# Patient Record
Sex: Female | Born: 1990 | Race: Black or African American | Hispanic: No | Marital: Single
Health system: Midwestern US, Community
[De-identification: ages and names within clinical notes are randomized; demographics above are authoritative.]

## PROBLEM LIST (undated history)

## (undated) DIAGNOSIS — F329 Major depressive disorder, single episode, unspecified: Secondary | ICD-10-CM

## (undated) DIAGNOSIS — M3212 Pericarditis in systemic lupus erythematosus: Secondary | ICD-10-CM

## (undated) DIAGNOSIS — K219 Gastro-esophageal reflux disease without esophagitis: Secondary | ICD-10-CM

## (undated) DIAGNOSIS — F32A Depression, unspecified: Secondary | ICD-10-CM

## (undated) DIAGNOSIS — Z8719 Personal history of other diseases of the digestive system: Secondary | ICD-10-CM

## (undated) DIAGNOSIS — M1990 Unspecified osteoarthritis, unspecified site: Secondary | ICD-10-CM

## (undated) DIAGNOSIS — M329 Systemic lupus erythematosus, unspecified: Secondary | ICD-10-CM

## (undated) DIAGNOSIS — M797 Fibromyalgia: Secondary | ICD-10-CM

## (undated) DIAGNOSIS — Z8711 Personal history of peptic ulcer disease: Secondary | ICD-10-CM

## (undated) DIAGNOSIS — F4310 Post-traumatic stress disorder, unspecified: Secondary | ICD-10-CM

## (undated) DIAGNOSIS — IMO0002 Reserved for concepts with insufficient information to code with codable children: Secondary | ICD-10-CM

## (undated) DIAGNOSIS — F1110 Opioid abuse, uncomplicated: Secondary | ICD-10-CM

## (undated) HISTORY — PX: UPPER GI ENDOSCOPY: SHX6162

## (undated) HISTORY — PX: NO PAST SURGERIES: SHX2092

---
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
## 2012-02-22 ENCOUNTER — Emergency Department (HOSPITAL_BASED_OUTPATIENT_CLINIC_OR_DEPARTMENT_OTHER)
Admission: EM | Admit: 2012-02-22 | Discharge: 2012-02-22 | Disposition: A | Discharge status: Home / Self Care | Payer: Medicaid Other | Attending: Emergency Medicine | Admitting: Emergency Medicine

## 2012-02-22 ENCOUNTER — Encounter (HOSPITAL_BASED_OUTPATIENT_CLINIC_OR_DEPARTMENT_OTHER): Payer: Self-pay | Admitting: *Deleted

## 2012-02-22 DIAGNOSIS — IMO0001 Reserved for inherently not codable concepts without codable children: Secondary | ICD-10-CM | POA: Insufficient documentation

## 2012-02-22 DIAGNOSIS — J45909 Unspecified asthma, uncomplicated: Secondary | ICD-10-CM | POA: Insufficient documentation

## 2012-02-22 DIAGNOSIS — Z87891 Personal history of nicotine dependence: Secondary | ICD-10-CM | POA: Insufficient documentation

## 2012-02-22 DIAGNOSIS — F329 Major depressive disorder, single episode, unspecified: Secondary | ICD-10-CM | POA: Insufficient documentation

## 2012-02-22 DIAGNOSIS — R21 Rash and other nonspecific skin eruption: Secondary | ICD-10-CM | POA: Insufficient documentation

## 2012-02-22 DIAGNOSIS — F3289 Other specified depressive episodes: Secondary | ICD-10-CM | POA: Insufficient documentation

## 2012-02-22 MED ORDER — PERMETHRIN 5 % EX CREA: TOPICAL_CREAM | CUTANEOUS | Status: AC

## 2012-02-22 NOTE — ED Notes (Signed)
Pt c/o rash to entire body , mother dx scabies x 1 week ago

## 2012-02-22 NOTE — ED Provider Notes (Signed)
History     CSN: [id number]  Arrival date & time 02/22/12  1547   First MD Initiated Contact with Patient 02/22/12 1648      Chief Complaint  Patient presents with  . Rash    (Consider location/radiation/quality/duration/timing/severity/associated sxs/prior treatment) Patient is a 21 y.o. female presenting with rash. The history is provided by the patient.  Rash  This is a new problem. The current episode started more than 2 days ago. There has been no fever. Associated symptoms comments: Mom was recently diagnosed with scabies and the patient developed a rash soon after her diagnosis. Rash is on upper extremities bilaterally on posterior shoulders and forearms. No pain, blistering or drainage. Patient complains of itching..    Past Medical History  Diagnosis Date  . Asthma   . Depressed   . Fibromyalgia     History reviewed. No pertinent past surgical history.  History reviewed. No pertinent family history.  History  Substance Use Topics  . Smoking status: Former Games developer  . Smokeless tobacco: Not on file  . Alcohol Use: Yes     occa liquor and beer    OB History    Grav Para Term Preterm Abortions TAB SAB Ect Mult Living                  Review of Systems  Constitutional: Negative for fever.  Musculoskeletal: Negative for myalgias.  Skin: Positive for rash.    Allergies  Review of patient's allergies indicates no known allergies.  Home Medications   Current Outpatient Rx  Name Route Sig Dispense Refill  . ALBUTEROL SULFATE HFA 108 (90 BASE) MCG/ACT IN AERS Inhalation Inhale 2 puffs into the lungs every 6 (six) hours as needed. For wheezing and shortness of breath.    . FLUOXETINE HCL 10 MG PO TABS Oral Take 20 mg by mouth daily. Pt takes 2 tabs of 10 mg for 20 mg dose    . LORATADINE 10 MG PO TABS Oral Take 10 mg by mouth daily.    . MULTIVITAMIN PO Oral Take 1 tablet by mouth daily.    Marland Kitchen PREGABALIN 25 MG PO CAPS Oral Take 25 mg by mouth 2 (two) times  daily.    . TRAZODONE HCL 50 MG PO TABS Oral Take 50 mg by mouth at bedtime.    . ALBUTEROL SULFATE (2.5 MG/3ML) 0.083% IN NEBU Nebulization Take 2.5 mg by nebulization every 6 (six) hours as needed. For wheezing and shortness of breath.      BP 133/55  Pulse 80  Temp 98.9 F (37.2 C) (Oral)  Resp 16  Ht 5\' 7"  (1.702 m)  Wt 140 lb (63.504 kg)  BMI 21.93 kg/m2  SpO2 100%  LMP 01/26/2012  Physical Exam  Constitutional: She is oriented to person, place, and time. She appears well-developed and well-nourished.  Neck: Normal range of motion.  Pulmonary/Chest: Effort normal.  Neurological: She is alert and oriented to person, place, and time.  Skin: Skin is warm and dry.       Minimally raised, normopigmented rash posterior shoulders that is diffuse, not confluent. No pustules. Singular raised bumps diffusely to forearms without pattern or confluence.     ED Course  Procedures (including critical care time)  Labs Reviewed - No data to display No results found.   No diagnosis found. 1. Rash    MDM  Will treat based on exposure but appearance of rash seems unlikely to be scabies. Discussed with mom. Derm referral.  Rodena Medin, PA-C 02/22/12 [phone]

## 2012-02-23 NOTE — ED Provider Notes (Signed)
Medical screening examination/treatment/procedure(s) were performed by non-physician practitioner and as supervising physician I was immediately available for consultation/collaboration.  Morene Cecilio, MD 02/23/12 1431 

## 2012-03-10 ENCOUNTER — Encounter (HOSPITAL_COMMUNITY): Payer: Self-pay | Admitting: Emergency Medicine

## 2012-03-10 ENCOUNTER — Emergency Department (HOSPITAL_COMMUNITY)
Admission: EM | Admit: 2012-03-10 | Discharge: 2012-03-10 | Disposition: A | Discharge status: Home / Self Care | Payer: Medicaid Other | Attending: Emergency Medicine | Admitting: Emergency Medicine

## 2012-03-10 DIAGNOSIS — Z760 Encounter for issue of repeat prescription: Secondary | ICD-10-CM

## 2012-03-10 NOTE — ED Notes (Signed)
Instructed patient per PA that we could not give her a new prescription.  Patient instructed to call pharmacy or PCP for new prescription.

## 2012-03-10 NOTE — ED Notes (Signed)
Pt states "I need another prescription for Prozac, I lost it, my psychiatrist wrote it, I think it's Geralynn Ochs, didn't call her, don't have the #"

## 2012-03-11 NOTE — ED Provider Notes (Signed)
Medical screening examination/treatment/procedure(s) were performed by non-physician practitioner and as supervising physician I was immediately available for consultation/collaboration.   Lyanne Co, MD 03/11/12 [phone]

## 2012-03-11 NOTE — ED Provider Notes (Signed)
I was informed of this patient by Richardean Sale RN. She was here for refill of her Prozac. I asked the nurse to inquire if the patient had any medical issues at this time. I expressed that we will be happy to see her,but that she could certainly call her pharmacy or her prescribing physician for refill on this medication.  Nurse spoke to the patient.Before I could get to the room for evaluation,the patient  had left.  Arthor Captain, PA-C 03/11/12 0155  Arthor Captain, PA-C 03/11/12 1048

## 2012-04-21 ENCOUNTER — Emergency Department (HOSPITAL_BASED_OUTPATIENT_CLINIC_OR_DEPARTMENT_OTHER)
Admission: EM | Admit: 2012-04-21 | Discharge: 2012-04-21 | Disposition: A | Discharge status: Home / Self Care | Payer: Medicaid Other | Attending: Emergency Medicine | Admitting: Emergency Medicine

## 2012-04-21 ENCOUNTER — Emergency Department (HOSPITAL_BASED_OUTPATIENT_CLINIC_OR_DEPARTMENT_OTHER): Payer: Medicaid Other

## 2012-04-21 ENCOUNTER — Encounter (HOSPITAL_BASED_OUTPATIENT_CLINIC_OR_DEPARTMENT_OTHER): Payer: Self-pay

## 2012-04-21 DIAGNOSIS — Y92410 Unspecified street and highway as the place of occurrence of the external cause: Secondary | ICD-10-CM | POA: Insufficient documentation

## 2012-04-21 DIAGNOSIS — S93409A Sprain of unspecified ligament of unspecified ankle, initial encounter: Secondary | ICD-10-CM

## 2012-04-21 HISTORY — DX: Post-traumatic stress disorder, unspecified: F43.10

## 2012-04-21 MED ORDER — HYDROCODONE-ACETAMINOPHEN 5-500 MG PO TABS: 1.0000 | ORAL_TABLET | Freq: Four times a day (QID) | ORAL | Status: DC | PRN

## 2012-04-21 NOTE — ED Notes (Signed)
Pt sustained left ankle injury yesterday.

## 2012-04-21 NOTE — ED Provider Notes (Addendum)
History     CSN: [id number]  Arrival date & time 04/21/12  0906   First MD Initiated Contact with Patient 04/21/12 [phone]      Chief Complaint  Patient presents with  . Ankle Pain    (Consider location/radiation/quality/duration/timing/severity/associated sxs/prior treatment) Patient is a 21 y.o. female presenting with ankle pain. The history is provided by the patient.  Ankle Pain  The incident occurred yesterday. The incident occurred in the street. The injury mechanism was a fall (riding on a skateboard and fell). The pain is present in the left ankle. The quality of the pain is described as sharp and throbbing. The pain is at a severity of 7/10. The pain is moderate. The pain has been constant since onset. Pertinent negatives include no loss of sensation and no tingling. Associated symptoms comments: limping. The symptoms are aggravated by activity, bearing weight and palpation. She has tried nothing for the symptoms. The treatment provided no relief.    Past Medical History  Diagnosis Date  . Asthma   . Depressed   . Fibromyalgia   . PTSD (post-traumatic stress disorder)     History reviewed. No pertinent past surgical history.  No family history on file.  History  Substance Use Topics  . Smoking status: Former Games developer  . Smokeless tobacco: Not on file  . Alcohol Use: Yes     occa liquor and beer    OB History    Grav Para Term Preterm Abortions TAB SAB Ect Mult Living                  Review of Systems  Neurological: Negative for tingling.  All other systems reviewed and are negative.    Allergies  Review of patient's allergies indicates no known allergies.  Home Medications   Current Outpatient Rx  Name Route Sig Dispense Refill  . FLUOXETINE HCL 10 MG PO TABS Oral Take 20 mg by mouth daily.     Marland Kitchen LORATADINE 10 MG PO TABS Oral Take 10 mg by mouth daily.    . MULTIVITAMIN PO Oral Take 1 tablet by mouth daily.    Marland Kitchen PREGABALIN 25 MG PO CAPS Oral Take 25  mg by mouth 2 (two) times daily.    . TRAZODONE HCL 50 MG PO TABS Oral Take 50 mg by mouth at bedtime.      BP 123/77  Pulse 72  Temp 98.1 F (36.7 C) (Oral)  Resp 16  Ht 5\' 4"  (1.626 m)  Wt 140 lb (63.504 kg)  BMI 24.03 kg/m2  SpO2 100%  LMP 04/14/2012  Physical Exam  Nursing note and vitals reviewed. Constitutional: She is oriented to person, place, and time. She appears well-developed and well-nourished. No distress.  HENT:  Head: Normocephalic and atraumatic.  Eyes: EOM are normal. Pupils are equal, round, and reactive to light.  Musculoskeletal:       Left ankle: She exhibits swelling. She exhibits normal range of motion and no deformity. tenderness. Lateral malleolus and posterior TFL tenderness found. No medial malleolus, no AITFL, no head of 5th metatarsal and no proximal fibula tenderness found.       Feet:  Neurological: She is alert and oriented to person, place, and time.  Skin: Skin is warm and dry. No rash noted. No erythema.  Psychiatric: She has a normal mood and affect. Her behavior is normal.    ED Course  Procedures (including critical care time)  Labs Reviewed - No data to display Dg Ankle Complete  Left  04/21/2012  *RADIOLOGY REPORT*  Clinical Data: Pain post trauma  LEFT ANKLE COMPLETE - 3+ VIEW  Comparison: None.  Findings: Frontal, oblique, lateral views were obtained.  No fracture or effusion.  Ankle mortise appears intact.  There is pes planus.  IMPRESSION:   No fracture.  Mortise intact.   Original Report Authenticated By: Arvin Collard. WOODRUFF III, M.D.      1. Ankle sprain       MDM   Patient with an injury to the ankle after a skateboarding accident. Pain with walking. No fibular head tenderness. Plain films wnl.  Will treat for sprain         Gwyneth Sprout, MD 04/21/12 1610  Gwyneth Sprout, MD 04/21/12 [phone]

## 2012-05-02 ENCOUNTER — Emergency Department (HOSPITAL_COMMUNITY)
Admission: EM | Admit: 2012-05-02 | Discharge: 2012-05-03 | Disposition: A | Discharge status: Other Acute Inpatient Hospital | Payer: Medicaid Other | Attending: Emergency Medicine | Admitting: Emergency Medicine

## 2012-05-02 ENCOUNTER — Encounter (HOSPITAL_COMMUNITY): Payer: Self-pay | Admitting: Emergency Medicine

## 2012-05-02 DIAGNOSIS — F1110 Opioid abuse, uncomplicated: Secondary | ICD-10-CM

## 2012-05-02 DIAGNOSIS — J45909 Unspecified asthma, uncomplicated: Secondary | ICD-10-CM | POA: Insufficient documentation

## 2012-05-02 DIAGNOSIS — Z79899 Other long term (current) drug therapy: Secondary | ICD-10-CM | POA: Insufficient documentation

## 2012-05-02 DIAGNOSIS — F329 Major depressive disorder, single episode, unspecified: Secondary | ICD-10-CM | POA: Insufficient documentation

## 2012-05-02 DIAGNOSIS — F3289 Other specified depressive episodes: Secondary | ICD-10-CM | POA: Insufficient documentation

## 2012-05-02 DIAGNOSIS — F1994 Other psychoactive substance use, unspecified with psychoactive substance-induced mood disorder: Secondary | ICD-10-CM

## 2012-05-02 DIAGNOSIS — IMO0001 Reserved for inherently not codable concepts without codable children: Secondary | ICD-10-CM | POA: Insufficient documentation

## 2012-05-02 DIAGNOSIS — R45851 Suicidal ideations: Secondary | ICD-10-CM | POA: Insufficient documentation

## 2012-05-02 DIAGNOSIS — F17200 Nicotine dependence, unspecified, uncomplicated: Secondary | ICD-10-CM | POA: Insufficient documentation

## 2012-05-02 DIAGNOSIS — F1310 Sedative, hypnotic or anxiolytic abuse, uncomplicated: Secondary | ICD-10-CM | POA: Insufficient documentation

## 2012-05-02 LAB — RAPID URINE DRUG SCREEN, HOSP PERFORMED
Barbiturates: NOT DETECTED
Cocaine: NOT DETECTED
Opiates: NOT DETECTED

## 2012-05-02 LAB — CBC
Hemoglobin: 11.7 g/dL — ABNORMAL LOW (ref 12.0–15.0)
MCH: 25.8 pg — ABNORMAL LOW (ref 26.0–34.0)
MCHC: 31.7 g/dL (ref 30.0–36.0)
MCV: 81.3 fL (ref 78.0–100.0)
RBC: 4.54 MIL/uL (ref 3.87–5.11)

## 2012-05-02 LAB — COMPREHENSIVE METABOLIC PANEL
BUN: 6 mg/dL (ref 6–23)
CO2: 26 mEq/L (ref 19–32)
Calcium: 9.3 mg/dL (ref 8.4–10.5)
Creatinine, Ser: 0.75 mg/dL (ref 0.50–1.10)
GFR calc Af Amer: >90 mL/min (ref >90)
GFR calc non Af Amer: >90 mL/min (ref >90)
Glucose, Bld: 80 mg/dL (ref 70–99)
Total Protein: 7.3 g/dL (ref 6.0–8.3)

## 2012-05-02 LAB — ETHANOL: Alcohol, Ethyl (B): <11 mg/dL (ref 0–11)

## 2012-05-02 MED ORDER — TRAZODONE HCL 50 MG PO TABS
50.0000 mg | ORAL_TABLET | Freq: Every day | ORAL | Status: DC
  Administered 2012-05-02: 50 mg via ORAL
  Filled 2012-05-02: qty 1

## 2012-05-02 MED ORDER — LORATADINE 10 MG PO TABS
10.0000 mg | ORAL_TABLET | Freq: Every day | ORAL | Status: DC
  Administered 2012-05-02 – 2012-05-03 (×2): 10 mg via ORAL
  Filled 2012-05-02 (×2): qty 1

## 2012-05-02 MED ORDER — FLUOXETINE HCL 20 MG PO TABS
20.0000 mg | ORAL_TABLET | Freq: Every day | ORAL | Status: DC
Start: 2012-05-02 — End: 2012-05-03
  Administered 2012-05-02 – 2012-05-03 (×2): 20 mg via ORAL
  Filled 2012-05-02 (×2): qty 1

## 2012-05-02 MED ORDER — ACETAMINOPHEN 325 MG PO TABS: 650.0000 mg | ORAL_TABLET | ORAL | Status: DC | PRN

## 2012-05-02 MED ORDER — CLONIDINE HCL 0.1 MG PO TABS: 0.1000 mg | ORAL_TABLET | Freq: Two times a day (BID) | ORAL | Status: DC | PRN

## 2012-05-02 MED ORDER — ALUM & MAG HYDROXIDE-SIMETH 200-200-20 MG/5ML PO SUSP: 30.0000 mL | ORAL | Status: DC | PRN

## 2012-05-02 MED ORDER — LORAZEPAM 1 MG PO TABS: 1.0000 mg | ORAL_TABLET | Freq: Three times a day (TID) | ORAL | Status: DC | PRN

## 2012-05-02 MED ORDER — NICOTINE 21 MG/24HR TD PT24
21.0000 mg | MEDICATED_PATCH | Freq: Every day | TRANSDERMAL | Status: DC
  Administered 2012-05-02 – 2012-05-03 (×2): 21 mg via TRANSDERMAL
  Filled 2012-05-02 (×2): qty 1

## 2012-05-02 MED ORDER — ONDANSETRON HCL 4 MG PO TABS: 4.0000 mg | ORAL_TABLET | Freq: Three times a day (TID) | ORAL | Status: DC | PRN

## 2012-05-02 MED ORDER — IBUPROFEN 600 MG PO TABS: 600.0000 mg | ORAL_TABLET | Freq: Three times a day (TID) | ORAL | Status: DC | PRN

## 2012-05-02 MED ORDER — PREGABALIN 25 MG PO CAPS
25.0000 mg | ORAL_CAPSULE | Freq: Two times a day (BID) | ORAL | Status: DC
  Administered 2012-05-02 – 2012-05-03 (×3): 25 mg via ORAL
  Filled 2012-05-02 (×3): qty 1

## 2012-05-02 NOTE — Treatment Plan (Signed)
Pt claims to be "popping"  4-5 percocet a day and more when she can get it, but interestingly is negative on her urine drug screen for opioids.  She is only positive for [facility] which does require a detox.  Additionally, pt denies SI, HI, and psychosis.  This pt does meet criteria for an opioid detox.  Perhaps, you can repeat the UDS or a specific percocet test if pt continues to report heavy daily use.  Otherwise there is no justification for an inpatient crisis or detox bed.

## 2012-05-02 NOTE — ED Notes (Signed)
Pt speaking on phone to mother.
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
## 2012-05-02 NOTE — ED Notes (Signed)
Pt states she is feeling depressed and feels like hurting herself  Pt states she started feeling this way a week ago  Denies plan  Has hx of same

## 2012-05-02 NOTE — Progress Notes (Signed)
Per discussion with [facility], pt does not meet inpatient criteria. Pt does not require inpatient detox.   Molly Perkins, LCSWA  [phone] .05/02/2012 10:37pm
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
## 2012-05-02 NOTE — ED Provider Notes (Signed)
Medical screening examination/treatment/procedure(s) were performed by non-physician practitioner and as supervising physician I was immediately available for consultation/collaboration.  Olivia Mackie, MD 05/02/12 1640

## 2012-05-02 NOTE — Progress Notes (Signed)
CSW requested tele psych for patient as pt continues to deny SI, HI, and psychosis at this time. Pt does not meet inpatient criteria for detox as patient is only positive for thc. ACT assessment counselor to follow up with patient pending tele psych.   If patient remains in the ED, day ED CSW will assist with pt csw needs at that time.   .Clinical social worker continuing to follow pt to assist with pt dc plans and further csw needs.   Catha Gosselin, LCSWA  [phone] .05/02/2012 1105pm

## 2012-05-02 NOTE — ED Provider Notes (Signed)
History     CSN: [id number]  Arrival date & time 05/02/12  0459   First MD Initiated Contact with Patient 05/02/12 [phone]      Chief Complaint  Patient presents with  . Medical Clearance    (Consider location/radiation/quality/duration/timing/severity/associated sxs/prior treatment) HPI Comments: Patient with c/o worsening depression, thoughts of hurting herself, rx pain medication abuse. States suicidal thoughts have been worse over past week. States she has been getting rx medications from other ppl for past year and desires to quit. Admits marijuana, tobacco, occasional ETOH use. Otherwise denies medical complaint. Has seen psychiatrist in past -- but not for some time. Onset gradual. Nothing makes symptoms better or worse.   The history is provided by the patient.    Past Medical History  Diagnosis Date  . Asthma   . Depressed   . Fibromyalgia   . PTSD (post-traumatic stress disorder)     History reviewed. No pertinent past surgical history.  Family History  Problem Relation Age of Onset  . Diabetes Other     History  Substance Use Topics  . Smoking status: Current Every Day Smoker    Types: Cigarettes  . Smokeless tobacco: Not on file  . Alcohol Use: Yes     occa liquor and beer    OB History    Grav Para Term Preterm Abortions TAB SAB Ect Mult Living                  Review of Systems  Constitutional: Negative for fever.  HENT: Negative for sore throat and rhinorrhea.   Eyes: Negative for redness.  Respiratory: Negative for cough.   Cardiovascular: Negative for chest pain.  Gastrointestinal: Negative for nausea, vomiting, abdominal pain and diarrhea.  Genitourinary: Negative for dysuria.  Musculoskeletal: Negative for myalgias.  Skin: Negative for rash.  Neurological: Negative for headaches.  Psychiatric/Behavioral: Positive for suicidal ideas.    Allergies  Review of patient's allergies indicates no known allergies.  Home Medications    Current Outpatient Rx  Name Route Sig Dispense Refill  . FLUOXETINE HCL 10 MG PO TABS Oral Take 20 mg by mouth daily.     Marland Kitchen HYDROCODONE-ACETAMINOPHEN 5-500 MG PO TABS Oral Take 1-2 tablets by mouth every 6 (six) hours as needed for pain. 15 tablet 0  . LORATADINE 10 MG PO TABS Oral Take 10 mg by mouth daily.    . MULTIVITAMIN PO Oral Take 1 tablet by mouth daily.    Marland Kitchen PREGABALIN 25 MG PO CAPS Oral Take 25 mg by mouth 2 (two) times daily.    . TRAZODONE HCL 50 MG PO TABS Oral Take 50 mg by mouth at bedtime.      BP 131/80  Pulse 70  Temp 98.5 F (36.9 C) (Oral)  Resp 16  SpO2 100%  LMP 04/16/2012  Physical Exam  Nursing note and vitals reviewed. Constitutional: She appears well-developed and well-nourished.  HENT:  Head: Normocephalic and atraumatic.  Eyes: Conjunctivae normal are normal. Right eye exhibits no discharge. Left eye exhibits no discharge.  Neck: Normal range of motion. Neck supple.  Cardiovascular: Normal rate, regular rhythm and normal heart sounds.   Pulmonary/Chest: Effort normal and breath sounds normal.  Abdominal: Soft. There is no tenderness.  Neurological: She is alert.  Skin: Skin is warm and dry.  Psychiatric: She exhibits a depressed mood. She expresses suicidal ideation. She expresses no homicidal ideation.    ED Course  Procedures (including critical care time)  Labs Reviewed  CBC -  Abnormal; Notable for the following:    Hemoglobin 11.7 (*)     MCH 25.8 (*)     All other components within normal limits  COMPREHENSIVE METABOLIC PANEL - Abnormal; Notable for the following:    Total Bilirubin 0.2 (*)     All other components within normal limits  URINE RAPID DRUG SCREEN (HOSP PERFORMED) - Abnormal; Notable for the following:    Tetrahydrocannabinol POSITIVE (*)     All other components within normal limits  ETHANOL  PREGNANCY, URINE   No results found.   1. Suicidal ideation   2. Narcotic abuse     6:13 AM Patient seen and examined.  Work-up initiated. Holding orders completed.    Vital signs reviewed and are as follows: Filed Vitals:   05/02/12 0507  BP: 131/80  Pulse: 70  Temp: 98.5 F (36.9 C)  Resp: 16   8:20 AM ACT aware and will see. Patient medically cleared.   MDM  Patient pending ACT eval.         Renne Crigler, PA 05/02/12 [phone]

## 2012-05-03 ENCOUNTER — Inpatient Hospital Stay (HOSPITAL_COMMUNITY)
Admission: EM | Admit: 2012-05-03 | Discharge: 2012-05-08 | DRG: 897 | Disposition: A | Discharge status: Home / Self Care | Payer: Medicaid Other | Source: Ambulatory Visit | Attending: Psychiatry | Admitting: Psychiatry

## 2012-05-03 ENCOUNTER — Encounter (HOSPITAL_COMMUNITY): Payer: Self-pay

## 2012-05-03 DIAGNOSIS — F329 Major depressive disorder, single episode, unspecified: Secondary | ICD-10-CM

## 2012-05-03 DIAGNOSIS — F3289 Other specified depressive episodes: Secondary | ICD-10-CM | POA: Diagnosis present

## 2012-05-03 DIAGNOSIS — J45909 Unspecified asthma, uncomplicated: Secondary | ICD-10-CM | POA: Diagnosis present

## 2012-05-03 DIAGNOSIS — F1220 Cannabis dependence, uncomplicated: Secondary | ICD-10-CM | POA: Diagnosis present

## 2012-05-03 DIAGNOSIS — Z23 Encounter for immunization: Secondary | ICD-10-CM

## 2012-05-03 DIAGNOSIS — F1120 Opioid dependence, uncomplicated: Principal | ICD-10-CM | POA: Diagnosis present

## 2012-05-03 DIAGNOSIS — Z79899 Other long term (current) drug therapy: Secondary | ICD-10-CM

## 2012-05-03 DIAGNOSIS — IMO0001 Reserved for inherently not codable concepts without codable children: Secondary | ICD-10-CM | POA: Diagnosis present

## 2012-05-03 MED ORDER — METHOCARBAMOL 500 MG PO TABS
500.0000 mg | ORAL_TABLET | Freq: Three times a day (TID) | ORAL | Status: AC | PRN
  Administered 2012-05-07: 500 mg via ORAL
  Filled 2012-05-03: qty 1

## 2012-05-03 MED ORDER — ONDANSETRON 4 MG PO TBDP: 4.0000 mg | ORAL_TABLET | Freq: Four times a day (QID) | ORAL | Status: AC | PRN

## 2012-05-03 MED ORDER — HYDROXYZINE HCL 25 MG PO TABS: 25.0000 mg | ORAL_TABLET | Freq: Four times a day (QID) | ORAL | Status: AC | PRN

## 2012-05-03 MED ORDER — TRAZODONE HCL 50 MG PO TABS
50.0000 mg | ORAL_TABLET | Freq: Every day | ORAL | Status: DC
  Administered 2012-05-03: 50 mg via ORAL
  Filled 2012-05-03 (×4): qty 1

## 2012-05-03 MED ORDER — FLUOXETINE HCL 20 MG PO CAPS
20.0000 mg | ORAL_CAPSULE | Freq: Every day | ORAL | Status: DC
  Administered 2012-05-04: 20 mg via ORAL
  Filled 2012-05-03 (×5): qty 1

## 2012-05-03 MED ORDER — CLONIDINE HCL 0.1 MG PO TABS
0.1000 mg | ORAL_TABLET | ORAL | Status: AC
  Administered 2012-05-05 – 2012-05-06 (×2): 0.1 mg via ORAL
  Filled 2012-05-03 (×4): qty 1

## 2012-05-03 MED ORDER — ALBUTEROL SULFATE HFA 108 (90 BASE) MCG/ACT IN AERS: 2.0000 | INHALATION_SPRAY | Freq: Four times a day (QID) | RESPIRATORY_TRACT | Status: DC | PRN

## 2012-05-03 MED ORDER — NICOTINE 14 MG/24HR TD PT24
14.0000 mg | MEDICATED_PATCH | Freq: Every day | TRANSDERMAL | Status: DC
  Administered 2012-05-04: 14 mg via TRANSDERMAL
  Filled 2012-05-03 (×4): qty 1

## 2012-05-03 MED ORDER — ACETAMINOPHEN 325 MG PO TABS
650.0000 mg | ORAL_TABLET | Freq: Four times a day (QID) | ORAL | Status: DC | PRN
Start: 2012-05-03 — End: 2012-05-08
  Administered 2012-05-04 – 2012-05-07 (×3): 650 mg via ORAL

## 2012-05-03 MED ORDER — DICYCLOMINE HCL 20 MG PO TABS: 20.0000 mg | ORAL_TABLET | Freq: Four times a day (QID) | ORAL | Status: AC | PRN

## 2012-05-03 MED ORDER — CLONIDINE HCL 0.1 MG PO TABS
0.1000 mg | ORAL_TABLET | Freq: Four times a day (QID) | ORAL | Status: AC
  Administered 2012-05-03 – 2012-05-04 (×4): 0.1 mg via ORAL
  Filled 2012-05-03 (×9): qty 1

## 2012-05-03 MED ORDER — INFLUENZA VIRUS VACC SPLIT PF IM SUSP
0.5000 mL | INTRAMUSCULAR | Status: AC
  Administered 2012-05-04: 0.5 mL via INTRAMUSCULAR

## 2012-05-03 MED ORDER — MAGNESIUM HYDROXIDE 400 MG/5ML PO SUSP: 30.0000 mL | Freq: Every day | ORAL | Status: DC | PRN

## 2012-05-03 MED ORDER — NAPROXEN 500 MG PO TABS: 500.0000 mg | ORAL_TABLET | Freq: Two times a day (BID) | ORAL | Status: DC | PRN

## 2012-05-03 MED ORDER — CLONIDINE HCL 0.1 MG PO TABS
0.1000 mg | ORAL_TABLET | Freq: Every day | ORAL | Status: DC
  Administered 2012-05-08: 0.1 mg via ORAL
  Filled 2012-05-03 (×2): qty 1

## 2012-05-03 MED ORDER — LORATADINE 10 MG PO TABS
10.0000 mg | ORAL_TABLET | Freq: Every day | ORAL | Status: DC
  Administered 2012-05-04: 10 mg via ORAL
  Filled 2012-05-03 (×5): qty 1

## 2012-05-03 MED ORDER — LOPERAMIDE HCL 2 MG PO CAPS: 2.0000 mg | ORAL_CAPSULE | ORAL | Status: AC | PRN

## 2012-05-03 MED ORDER — ALUM & MAG HYDROXIDE-SIMETH 200-200-20 MG/5ML PO SUSP: 30.0000 mL | ORAL | Status: DC | PRN

## 2012-05-03 NOTE — BHH Counselor (Signed)
Accepted to [facility] by Donell Sievert, PA to Dr. Daleen Bo ([phone]). Updated EDP and RN. Pt is voluntary & to be transported via security.

## 2012-05-03 NOTE — ED Provider Notes (Signed)
No issues as per nursing. Accepted to behavioral health, pending bed availability. Patient sleeping comfortably.   Molly Canal, MD 05/03/12 [phone]

## 2012-05-03 NOTE — Tx Team (Signed)
Initial Interdisciplinary Treatment Plan  PATIENT STRENGTHS: (choose at least two) Ability for insight Capable of independent living Communication skills Motivation for treatment/growth Supportive family/friends  PATIENT STRESSORS: Health problems Substance abuse   PROBLEM LIST: Problem List/Patient Goals Date to be addressed Date deferred Reason deferred Estimated date of resolution  DRUG ABUSE 16/10/13     DEPRESSION 16/10/13                                                DISCHARGE CRITERIA:  Ability to meet basic life and health needs Adequate post-discharge living arrangements Improved stabilization in mood, thinking, and/or behavior Medical problems require only outpatient monitoring Motivation to continue treatment in a less acute level of care Need for constant or close observation no longer present Reduction of life-threatening or endangering symptoms to within safe limits Safe-care adequate arrangements made Verbal commitment to aftercare and medication compliance Withdrawal symptoms are absent or subacute and managed without 24-hour nursing intervention  PRELIMINARY DISCHARGE PLAN: Attend aftercare/continuing care group Return to previous work or school arrangements  PATIENT/FAMIILY INVOLVEMENT: This treatment plan has been presented to and reviewed with the patient, [place] C Rho, and/or family member,   The patient and family have been given the opportunity to ask questions and make suggestions.  JEHU-APPIAH, Keefer Soulliere K 05/03/2012, 3:23 PM

## 2012-05-03 NOTE — Progress Notes (Addendum)
CSW spoke with [facility] assessment, Yolanda, regarding clarification ofcsw pt assessment. Pt was accepted and transferred to [facility] before my shift started.   This CSW completed assessment (10/15) in which patient denied SI, HI, and psychosis to this CSW.   CSW referred patient to [facility] due to recommendation from psychiatrist of substance abuse and depression for medication management. Pt reported to CSW that she had been non compliant with medication and was not being seen by a psychiatrist or therapist for over a year.   On 10/15 (please see previous note) CSW was told that patient did not meet inpatient criteria due to patient not needing inpatient detox  due to UDS only being postive for THC. CSW was also told by [facility] that pt did not meet criteria as pt was no SI, HI, or in psychosis. CSW requested another tele psych consult at that time. At that time shift ended and patient follow up was  turned over to ACT assessment counselor.   Catha Gosselin, LCSWA  [phone] .05/03/2012 1634pm

## 2012-05-03 NOTE — Progress Notes (Signed)
Group Note  Date:  05/03/2012 Time:  1:15  Group Topic/Focus:  Emotion Regulation  Participation Level:  Did Not Attend  Angline Schweigert S 05/03/2012, 3:40 PM

## 2012-05-03 NOTE — Progress Notes (Signed)
Patient ID: [place] C Enloe, female   DOB: 01-Jul-1991, 21 y.o.   MRN: [id number]  D: Patient admitted voluntary stating "sleeping a lot, depressed and want to hurt self". Patient stated she has been "pill popping because of lost of employment" . Patient stated she has been taking 4-5 percocet pills twice daily for a year.  Patient  States smoking 3 cigarette daily and uses alcohol occasionally. Patient stated she uses THC 5 times daily. Patient has a history of fibromyalgia and takes lyrica.  Denies SI/HI/AV and contract for safety. A: Pt admitted to unit per protocol, skin assessment and search done.  Pt educated on therapeutic milieu rules. Pt was introduced to milieu by nursing staff.   R: Pt was responsive to education. Pt contracted for safety, 15 min safety checks started. Clinical research associate offered support

## 2012-05-03 NOTE — Progress Notes (Signed)
D: Pt,s mother talked with this Clinical research associate & stated that pt. has done as much as 70 percocet in one day & that she also snorts opiates & mixes opiates with marijuana & smokes it.A: Pt on clonidine detox protocol.Continues on 15 minute checks. R: Pt safety maintained.

## 2012-05-03 NOTE — ED Notes (Signed)
Awaiting CNA to go with Security to [facility].

## 2012-05-04 ENCOUNTER — Encounter (HOSPITAL_COMMUNITY): Payer: Self-pay | Admitting: Physician Assistant

## 2012-05-04 DIAGNOSIS — F1120 Opioid dependence, uncomplicated: Principal | ICD-10-CM

## 2012-05-04 DIAGNOSIS — F1220 Cannabis dependence, uncomplicated: Secondary | ICD-10-CM | POA: Diagnosis present

## 2012-05-04 MED ORDER — TRAZODONE HCL 50 MG PO TABS
50.0000 mg | ORAL_TABLET | Freq: Every day | ORAL | Status: DC
  Administered 2012-05-04 – 2012-05-07 (×4): 50 mg via ORAL
  Filled 2012-05-04 (×5): qty 1
  Filled 2012-05-04: qty 3

## 2012-05-04 MED ORDER — NICOTINE 14 MG/24HR TD PT24
14.0000 mg | MEDICATED_PATCH | Freq: Every day | TRANSDERMAL | Status: DC
  Administered 2012-05-05 – 2012-05-08 (×4): 14 mg via TRANSDERMAL
  Filled 2012-05-04 (×6): qty 1

## 2012-05-04 MED ORDER — LORATADINE 10 MG PO TABS
10.0000 mg | ORAL_TABLET | Freq: Every day | ORAL | Status: DC
  Administered 2012-05-05 – 2012-05-08 (×4): 10 mg via ORAL
  Filled 2012-05-04: qty 1
  Filled 2012-05-04: qty 3
  Filled 2012-05-04 (×4): qty 1

## 2012-05-04 MED ORDER — FLUOXETINE HCL 20 MG PO CAPS
20.0000 mg | ORAL_CAPSULE | Freq: Every day | ORAL | Status: DC
  Administered 2012-05-04 – 2012-05-08 (×5): 20 mg via ORAL
  Filled 2012-05-04 (×7): qty 1
  Filled 2012-05-04: qty 3

## 2012-05-04 MED ORDER — TRAZODONE HCL 50 MG PO TABS
50.0000 mg | ORAL_TABLET | Freq: Every day | ORAL | Status: DC
  Filled 2012-05-04: qty 1

## 2012-05-04 MED ORDER — IBUPROFEN 200 MG PO TABS
400.0000 mg | ORAL_TABLET | Freq: Four times a day (QID) | ORAL | Status: DC | PRN
  Administered 2012-05-07: 400 mg via ORAL
  Filled 2012-05-04: qty 2

## 2012-05-04 MED ORDER — TRAZODONE HCL 50 MG PO TABS: 50.0000 mg | ORAL_TABLET | Freq: Every day | ORAL | Status: DC

## 2012-05-04 MED ORDER — FLUOXETINE HCL 20 MG PO CAPS: 20.0000 mg | ORAL_CAPSULE | Freq: Every day | ORAL | Status: DC

## 2012-05-04 MED ORDER — FLUOXETINE HCL 20 MG PO CAPS
20.0000 mg | ORAL_CAPSULE | Freq: Every day | ORAL | Status: DC
  Filled 2012-05-04: qty 1

## 2012-05-04 NOTE — Progress Notes (Signed)
Patient ID: Molly Perkins, female   DOB: August 15, 1990, 21 y.o.   MRN: [id number] D: Patient appears flat and depressed. Pt. Complained of dizziness with  B/P 93/54 and PR 83.  Calm and cooperative with assessment. Pt. reports sleep was "well". Appetite is "good". Pt rates depression  was "1" and hopelessness was "1" out of 10 scale. Patient denies no pain. Denies SI/HI/AV and contract for safety at this time. Offered no additional question or concerns  A: Clonidine was held due to the above complaint.  Patient offered fluids and instructed to rise slowly. Safety has been maintained with Q15 minutes observation. Supported and encouragement provided.  R: Patient remains safe. She is complaint with medications. Safety has been maintained Q15 and continue current POC.

## 2012-05-04 NOTE — Progress Notes (Signed)
Pt attended discharge planning group and actively participated in group.  SW provided pt with today's workbook.  Pt presents with flat affect and depressed mood.  Pt denies having depression, anxiety and SI/HI.  Pt states that she came to the hospital to get off of pills.  Pt states that she is currently in school and stays with her mom in [place].  Pt states that her mom wants her to go to long term treatment but pt is unsure if this is what she wants to do right now.  SW will assess for appropriate referrals.  No further needs voiced by pt at this time.  Safety planning and suicide prevention discussed.  Pt participated in discussion and acknowledged an understanding of the information provided.       Reyes Ivan, LCSWA 05/04/2012  10:22 AM

## 2012-05-04 NOTE — [facility] Suicide Risk Assessment (Signed)
Suicide Risk Assessment  Admission Assessment     Nursing information obtained from:  Patient Demographic factors:  Gay, lesbian, or bisexual orientation;Unemployed Current Mental Status:  Self-harm behaviors Loss Factors:  Decline in physical health Historical Factors:  Family history of mental illness or substance abuse Risk Reduction Factors:  Positive social support  CLINICAL FACTORS:   Depression:   Anhedonia Comorbid alcohol abuse/dependence Hopelessness Impulsivity Alcohol/Substance Abuse/Dependencies  COGNITIVE FEATURES THAT CONTRIBUTE TO RISK:  Thought constriction (tunnel vision)    SUICIDE RISK:   Mild:  Suicidal ideation of limited frequency, intensity, duration, and specificity.  There are no identifiable plans, no associated intent, mild dysphoria and related symptoms, good self-control (both objective and subjective assessment), few other risk factors, and identifiable protective factors, including available and accessible social support.  PLAN OF CARE: Restart Prozac to target mood symptoms. Initiate Clonidine protocol for detox from opiates.   Molly Perkins 05/04/2012, 1:58 PM

## 2012-05-04 NOTE — Progress Notes (Signed)
Nutrition Brief Note  Patient identified on the Malnutrition Screening Tool (MST) Report score of 2  Body mass index is 23.52 kg/(m^2). Pt meets criteria for WNL based on current BMI.   Current diet order is Regular, patient is consuming approximately 90% of meals at this time. Labs and medications reviewed.   Patient reported her appetite and intake have been well. Patient lunch meal present at time of RD visit, PO intake 100%. Patient reported a UBW of 140 lb. Weight stable.  No nutrition interventions warranted at this time. If nutrition issues arise, please consult RD.   Iven Finn [facility] [phone]
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
## 2012-05-04 NOTE — Tx Team (Signed)
Interdisciplinary Treatment Plan Update (Adult)  Date:  05/04/2012  Time Reviewed:  9:59 AM   Progress in Treatment: Attending groups: Yes Participating in groups:  Yes Taking medication as prescribed: Yes Tolerating medication:  Yes Family/Significant othe contact made:  Counselor assessing for appropriate contact Patient understands diagnosis:  Yes Discussing patient identified problems/goals with staff:  Yes Medical problems stabilized or resolved:  Yes Denies suicidal/homicidal ideation: Yes Issues/concerns per patient self-inventory:  None identified Other: N/A  New problem(s) identified: None Identified  Reason for Continuation of Hospitalization: Anxiety Depression Medication stabilization  Interventions implemented related to continuation of hospitalization: mood stabilization, medication monitoring and adjustment, group therapy and psycho education, safety checks q 15 mins  Additional comments: N/A  Estimated length of stay: 3-5 days  Discharge Plan: SW is assessing for appropriate referrals.    New goal(s): N/A  Review of initial/current patient goals per problem list:    1.  Goal(s): Address substance use  Met:  No  Target date: by discharge  As evidenced by: completing detox protocol and refer to appropriate treatment  2.  Goal (s): Reduce depressive and anxiety symptoms  Met:  No  Target date: by discharge  As evidenced by: Reducing depression from a 10 to a 3 as reported by pt.     Attendees: Patient:     Family:     Physician: Patrick North, MD 05/04/2012 9:59 AM   Nursing: Roswell Miners, RN 05/04/2012 9:59 AM   Case Manager:  Reyes Ivan, LCSWA 05/04/2012  9:59 AM   Counselor:  Ronda Fairly, LCSWA 05/04/2012  9:59 AM   Other: Jules Schick, RN 05/04/2012 9:59 AM   Other:  Charlyne Mom, RN 05/04/2012 9:59 AM   Other:  Rodman Key, RN 05/04/2012 10:00 AM   Other:      Scribe for Treatment Team:   Reyes Ivan 05/04/2012 9:59 AM

## 2012-05-05 NOTE — Discharge Planning (Signed)
Pt was seen this morning during discharge planning group.  Pt states that she will return home with her mother.  Her mother will transport her home from the hospital.  Pt states that she is not having any withdrawal symptoms. Pt refused outpatient services, yet may attend NA groups in the community.  Pt rated her depression and anxiety level at a zero.

## 2012-05-05 NOTE — Progress Notes (Signed)
[facility] Group Notes:  (Counselor/Nursing/MHT/Case Management/Adjunct)  05/05/2012 3:17 PM  Type of Therapy:  Psychoeducational Skills  Participation Level:  Active  Participation Quality:  Appropriate  Affect:  Appropriate  Cognitive:  Appropriate  Insight:  Good  Engagement in Group:  Good  Engagement in Therapy:  Good  Modes of Intervention:  Education  Summary of Progress/Problems:Staff engaged patient in a therapeutic group entitled "Early Warning Signs" and asked patient to think back to the episode that brought them into the hospital. Encourage them to identify how they were feeling and what they were thinking while in that crisis. Staff Emphasize the importance of  being aware of early warning signs in order to be better able to deal with a situation before it turns into a crisis. Patient was engaged in the group and provided positive insight on future.    Ardelle Park O 05/05/2012, 3:17 PM

## 2012-05-05 NOTE — Progress Notes (Signed)
D: Pt in bed resting with eyes closed. Respirations even and unlabored. Pt appears to be in no signs of distress at this time. A: Q15min checks remains for this pt. R: Pt remains safe at this time.   

## 2012-05-05 NOTE — Progress Notes (Signed)
[facility] Adult Inpatient Family/Significant Other Suicide Prevention Education  Suicide Prevention Education:  Education Completed; Lia Hopping, mother, at [phone] has been identified by the patient as the family member/significant other with whom the patient will be residing, and identified as the person(s) who will aid the patient in the event of a mental health crisis (suicidal ideations/suicide attempt).  With written consent from the patient, the family member/significant other has been provided the following suicide prevention education, prior to the and/or following the discharge of the patient.  The suicide prevention education provided includes the following:  Suicide risk factors  Suicide prevention and interventions  National Suicide Hotline telephone number  [facility] assessment telephone number  [facility] Emergency Assistance 911  [facility] and/or Residential Mobile Crisis Unit telephone number; mother unable to write down number herself during conversation thus two mobile crisis cards put in chart, one for patient and one for mother.  Service was explained to mother and patient.  Request made of family/significant other to:  Remove weapons (e.g., guns, rifles, knives), all items previously/currently identified as safety concern.    Remove drugs/medications (over-the-counter, prescriptions, illicit drugs), all items previously/currently identified as a safety concern.  Patient's mother reports there are no firearms in the home and that she has installed a deadbolt lock on her own bedroom door as she is prescribed pain medications. Mother is willing to keep her bedroom door secured at all times when she is not in the room herself.   The family member/significant other verbalizes understanding of the suicide prevention education information provided.  The family member/significant other agrees to remove the items of safety concern listed  above.  Mother concerned regarding patient's disclosure that she crushes (percocet) and smokes them verses swallowing.   Clide Dales 05/05/2012, 9:43 AM

## 2012-05-05 NOTE — Progress Notes (Signed)
Patient ID: Molly Perkins, female   DOB: 11-19-1990, 21 y.o.   MRN: [id number] D: Pt denies lethality and A/V/H's or other problems, except for feeling sleepy.  Pt. was required to be prompted at least three times to go to group, which she finally got out of bed to attend.  A: Pt looks sleepy tonight, but is able to go to group.  R: Will continue to follow for withdrawal symptoms.

## 2012-05-05 NOTE — Progress Notes (Signed)
[facility] Group Notes:  (Counselor/Nursing/MHT/Case Management/Adjunct)  05/05/2012 11:19 AM  Type of Therapy:  Psychoeducational Skills  Participation Level:  Active  Participation Quality:  Appropriate  Affect:  Appropriate  Cognitive:  Appropriate  Insight:  Good  Engagement in Group:  Good  Engagement in Therapy:  Good  Modes of Intervention:  Socialization  Summary of Progress/Problems:Staff engaged patient in a therapeutic activity that involved them answering one question and elaborating on their answer. Patient was engaged and cooperative during group and expressed their feelings in a therapeutic manner. Patient was open and honest when providing an answer.       Ardelle Park O 05/05/2012, 11:19 AM

## 2012-05-05 NOTE — Progress Notes (Signed)
Patient did not attend the evening karaoke group. Pt was notified of group but chose to not attend and remained in her room.

## 2012-05-05 NOTE — Progress Notes (Signed)
[facility] Group Notes:  (Counselor/Nursing/MHT/Case Management/Adjunct)   Type of Therapy:  Group Therapy at 1:15  Participation Level:  Active  Participation Quality:  Appropriate, Attentive and Sharing  Affect:  Flat  Cognitive:  Alert and Oriented  Insight:  Good  Engagement in Group:  Good  Engagement in Therapy:  Good  Modes of Intervention:  Clarification, Socialization and Support  Summary of Progress/Problems: Focus of group processing session was feelings about relapse. [place]  shared that for her relapse would mean feeling less than, different while recovery and sobriety would mean  Being able to return to school and get the most out of it. [place] was able to process how it has felt to feel different from peers and was encouraged to think of herself like her name [place], unique and beautiful, by others in group.    Clide Dales 05/05/2012, 4:51 PM

## 2012-05-05 NOTE — Progress Notes (Signed)
[facility] MD Progress Note  05/05/2012 10:42 AM  S: Patient see, reports doing okay. Detox progressing without complications. Patient attending groups. Not very communicative.  Diagnosis:   Axis I: Opiate dependence Axis II: No diagnosis Axis III:  Past Medical History  Diagnosis Date  . Asthma   . Depressed   . Fibromyalgia   . PTSD (post-traumatic stress disorder)    Axis IV: other psychosocial or environmental problems Axis V: 51-60 moderate symptoms  ADL's:  Impaired  Sleep: Fair  Appetite:  Fair  Mental Status Examination/Evaluation: Objective:  Appearance: Disheveled  Eye Contact::  Fair  Speech:  Slow, minimal  Volume:  Decreased  Mood:  Dysphoric  Affect:  Blunt  Thought Process:  Coherent  Orientation:  Full  Thought Content:  WDL  Suicidal Thoughts:  No  Homicidal Thoughts:  No  Memory:  Immediate;   Fair Recent;   Fair Remote;   Fair  Judgement:  Fair  Insight:  Fair  Psychomotor Activity:  Normal  Concentration:  Fair  Recall:  Fair  Akathisia:  No  Handed:  Right  AIMS (if indicated):     Assets:  Desire for Improvement  Sleep:  Number of Hours: 6.25    Vital Signs:Blood pressure 119/68, pulse 83, temperature 98.3 F (36.8 C), temperature source Oral, resp. rate 16, height 5\' 4"  (1.626 m), weight 62.143 kg (137 lb), last menstrual period 04/16/2012. Current Medications: Current Facility-Administered Medications  Medication Dose Route Frequency Provider Last Rate Last Dose  . acetaminophen (TYLENOL) tablet 650 mg  650 mg Oral Q6H PRN Verne Spurr, PA-C   650 mg at 05/04/12 1119  . albuterol (PROVENTIL HFA;VENTOLIN HFA) 108 (90 BASE) MCG/ACT inhaler 2 puff  2 puff Inhalation Q6H PRN Verne Spurr, PA-C      . alum & mag hydroxide-simeth (MAALOX/MYLANTA) 200-200-20 MG/5ML suspension 30 mL  30 mL Oral Q4H PRN Verne Spurr, PA-C      . cloNIDine (CATAPRES) tablet 0.1 mg  0.1 mg Oral QID Verne Spurr, PA-C   0.1 mg at 05/04/12 2212   Followed by  .  cloNIDine (CATAPRES) tablet 0.1 mg  0.1 mg Oral BH-qamhs Verne Spurr, PA-C       Followed by  . cloNIDine (CATAPRES) tablet 0.1 mg  0.1 mg Oral QAC breakfast Verne Spurr, PA-C      . dicyclomine (BENTYL) tablet 20 mg  20 mg Oral Q6H PRN Verne Spurr, PA-C      . FLUoxetine (PROZAC) capsule 20 mg  20 mg Oral Daily Verne Spurr, PA-C   20 mg at 05/05/12 0847  . hydrOXYzine (ATARAX/VISTARIL) tablet 25 mg  25 mg Oral Q6H PRN Verne Spurr, PA-C      . ibuprofen (ADVIL,MOTRIN) tablet 400 mg  400 mg Oral Q6H PRN Verne Spurr, PA-C      . influenza  inactive virus vaccine (FLUZONE/FLUARIX) injection 0.5 mL  0.5 mL Intramuscular Tomorrow-1000 Kendon Sedeno, MD   0.5 mL at 05/04/12 1110  . loperamide (IMODIUM) capsule 2-4 mg  2-4 mg Oral PRN Verne Spurr, PA-C      . loratadine (CLARITIN) tablet 10 mg  10 mg Oral Daily Verne Spurr, PA-C   10 mg at 05/05/12 0847  . magnesium hydroxide (MILK OF MAGNESIA) suspension 30 mL  30 mL Oral Daily PRN Verne Spurr, PA-C      . methocarbamol (ROBAXIN) tablet 500 mg  500 mg Oral Q8H PRN Verne Spurr, PA-C      . nicotine (NICODERM CQ - dosed  in mg/24 hours) patch 14 mg  14 mg Transdermal Daily Verne Spurr, PA-C   14 mg at 05/05/12 0848  . ondansetron (ZOFRAN-ODT) disintegrating tablet 4 mg  4 mg Oral Q6H PRN Verne Spurr, PA-C      . traZODone (DESYREL) tablet 50 mg  50 mg Oral QHS Verne Spurr, PA-C   50 mg at 05/04/12 2212  . DISCONTD: FLUoxetine (PROZAC) capsule 20 mg  20 mg Oral Daily Verne Spurr, PA-C   20 mg at 05/04/12 0816  . DISCONTD: FLUoxetine (PROZAC) capsule 20 mg  20 mg Oral Daily Verne Spurr, PA-C      . DISCONTD: FLUoxetine (PROZAC) capsule 20 mg  20 mg Oral Daily Verne Spurr, PA-C      . DISCONTD: loratadine (CLARITIN) tablet 10 mg  10 mg Oral Daily Verne Spurr, PA-C   10 mg at 05/04/12 0825  . DISCONTD: naproxen (NAPROSYN) tablet 500 mg  500 mg Oral BID PRN Verne Spurr, PA-C      . DISCONTD: nicotine (NICODERM CQ - dosed in  mg/24 hours) patch 14 mg  14 mg Transdermal Daily Novella Abraha, MD   14 mg at 05/04/12 0816  . DISCONTD: traZODone (DESYREL) tablet 50 mg  50 mg Oral QHS Kerry Hough, PA   50 mg at 05/03/12 2220  . DISCONTD: traZODone (DESYREL) tablet 50 mg  50 mg Oral QHS Verne Spurr, PA-C      . DISCONTD: traZODone (DESYREL) tablet 50 mg  50 mg Oral QHS Verne Spurr, PA-C        Lab Results: No results found for this or any previous visit (from the past 48 hour(s)).  Physical Findings: AIMS: Facial and Oral Movements Muscles of Facial Expression: None, normal Lips and Perioral Area: None, normal Jaw: None, normal Tongue: None, normal,Extremity Movements Upper (arms, wrists, hands, fingers): None, normal Lower (legs, knees, ankles, toes): None, normal, Trunk Movements Neck, shoulders, hips: None, normal, Overall Severity Severity of abnormal movements (highest score from questions above): None, normal Incapacitation due to abnormal movements: None, normal Patient's awareness of abnormal movements (rate only patient's report): No Awareness, Dental Status Current problems with teeth and/or dentures?: No Does patient usually wear dentures?: No  CIWA:  CIWA-Ar Total: 2  COWS:  COWS Total Score: 1   Treatment Plan Summary: Daily contact with patient to assess and evaluate symptoms and progress in treatment Medication management  Plan: Continue current plan of care.  Lealand Elting 05/05/2012, 10:42 AM

## 2012-05-06 NOTE — Progress Notes (Signed)
Pt was very upset when the nurse came on shift stated,"I want to just punch that doctor out," Pt wanted to leave today and started she does not understand why she can not be discharged. Pt was in the bed and PA did come and speak to the pt. Pt contracts for safety and did calm down . Denies any SI or HI and contracts for safety

## 2012-05-06 NOTE — Progress Notes (Signed)
Took over Pt's care at 2330.  Resting in bed with eyes closed, respirations even and unlabored.  Will continue to monitor.

## 2012-05-06 NOTE — Progress Notes (Signed)
Patient did attend the evening speaker AA meeting.  

## 2012-05-06 NOTE — Progress Notes (Signed)
[facility] In Patient Progress Note 05/06/2012 1:59 PM Molly Perkins [birth year] [id number] Hospital day #:3 Diagnosis: Diagnosis:  Axis I: Opiate dependence  Axis II: No diagnosis  Axis III:  Past Medical History   Diagnosis  Date   .  Asthma    .  Depressed    .  Fibromyalgia    .  PTSD (post-traumatic stress disorder)     Axis IV: other psychosocial or environmental problems  Axis V: 51-60 moderate symptoms   ADL's:  Intact Sleep:  Returned to normal Appetite:ok  Groups:Good  Subjective: Molly is requesting discharge home today. She states she is ready and knows everything she needs to know about addiction and she is ready to get started on her new life.   height is 5\' 4"  (1.626 m) and weight is 62.143 kg (137 lb). Her oral temperature is 98.4 F (36.9 C). Her blood pressure is 92/51 and her pulse is 75. Her respiration is 16.   Objective: Patient is having rigid thinking and feels that she knows her own mind and that she is ready to leave dispite her mother's concerns regarding her pill usage.  Ros: Constitutional: WD WN Adult in NAD ENT:        Negative for runny nose, sore throat, congestion, dysphagia COR:       Negative for cough, SOB, chest pain, wheezing GI:         Negative for Nausea, vomiting, diarrhea, constipation, abdominal pain Neuro:  negative for dizziness, blurred vision, headaches, numbness or tingling Ortho:   negative for limb pain, swelling, change in ambulatory status.  Mental Status Exam Level of Consciousness: awake Orientation: x 3 General Appearance:  casual Behavior:  cooperative Eye Contact:  good Motor Behavior:  normal Speech:  clear Mood: irritable Suicidal Ideation: No suicidal ideation, no plan, no intent, no means. Homicidal Ideation:  No homicidal ideation, no plan, no intent, no means.  Affect:  congruent Anxiety Level:  moderate,  Thought Process:  linear Thought Content:  No AVH Perception:  intact Judgment:  Poor, Insight:   Poor, Cognition:  At least average Sleep:  Number of Hours: 6   Lab Results: No results found for this or any previous visit (from the past 48 hour(s)). Labs are reviewed. Physical Findings: AIMS: CIWA-Ar Total: 0  CIWA:  CIWA-Ar Total: 0  COWS:  COWS Total Score: 1   Medication:  . cloNIDine  0.1 mg Oral BH-qamhs   Followed by  . cloNIDine  0.1 mg Oral QAC breakfast  . FLUoxetine  20 mg Oral Daily  . loratadine  10 mg Oral Daily  . nicotine  14 mg Transdermal Daily  . traZODone  50 mg Oral QHS    Treatment Plan Summary: 1. Admit for crisis management and stabilization. 2. Medication management to reduce current symptoms to base line and improve the patient's overall level of functioning 3. Treat health problems as indicated. 4. Develop treatment plan to decrease risk of relapse upon discharge and the need for readmission. 5. Psycho-social education regarding relapse prevention and self care. 6. Health care follow up as needed for medical problems. 7. Restart home medications where appropriate.   Plan: 1. Patient is educated in the benefits of working within the treatment plan.  She is also informed of her mother's concern regarding her drug usage.  Patient states her mother would want her to stay. 2. Patient is encouraged to discuss her follow up care and plan for treatment team on Monday. Rona Ravens.  Dondrell Loudermilk PAC 05/06/2012, 1:59 PM

## 2012-05-06 NOTE — Progress Notes (Signed)
Patient ID: Molly Perkins, female   DOB: Apr 19, 1991, 21 y.o.   MRN: [id number]  [facility] Group Notes:  (Counselor/Nursing/MHT/Case Management/Adjunct)  05/06/2012 1:15 PM  Type of Therapy:  Group Therapy, Dance/Movement Therapy   Participation Level:  Active  Participation Quality:  Appropriate  Affect:  Anxious and Appropriate  Cognitive:  Appropriate  Insight:  Limited  Engagement in Group:  Good  Engagement in Therapy:  Good  Modes of Intervention:  Clarification, Problem-solving, Role-play, Socialization and Support  Summary of Progress/Problems:  Pt attended counseling group on energy and ways that we take in or pour out our energy.  Pt engaged in group discussion and experiential activity to provide a visual representation of the abstract concept of energy use and flow.  Pt expressed that she has no desire to change her use of marijuana but would like to change her use of "pills."  Pt expresses limited insight and motivation to change.  Pt participated actively in the experiential activity and discussion.    Debarah Crape 05/06/2012. 2:27 PM

## 2012-05-06 NOTE — Progress Notes (Signed)
Psychoeducational Group Note  Date:  05/06/2012 Time:  1025  Group Topic/Focus:  Identifying Needs:   The focus of this group is to help patients identify their personal needs that have been historically problematic and identify healthy behaviors to address their needs.  Participation Level:  Active  Participation Quality:  Appropriate, Attentive, Monopolizing and Sharing  Affect:  Appropriate and Irritable  Cognitive:  Appropriate  Insight:  Good  Engagement in Group:  Good  Additional Comments:  Pt attended and participated in group discussing/identifying needs. Pts identified both positive and negative means of how they met their needs. Pts focus during group was her problem with the lack of aftercare planning/treatment provided. Pt needed to be redirected several times to stay on group topic. Pt was able to provide a list of needs and negative and positive coping skills.    Dalia Heading 05/06/2012, 12:09 PM

## 2012-05-06 NOTE — Progress Notes (Signed)
D.  Pt pleasant on approach, denies complaints.  Pt denies SI/HI/hallucinations at this time.  Positive for evening AA group.  Interacting appropriately within milieu.  Asked to take night medications early due to some continued leg pain.  A.  Support and encouragement offered R.  No acute distress noted, will continue to monitor.

## 2012-05-06 NOTE — Progress Notes (Signed)
Patient ID: [place] C Altice, female   DOB: [birth year], 21 y.o.   MRN: [id number] Pt. attended and participated in aftercare planning group. Pt. accepted information on suicide prevention, warning signs to look for with suicide and crisis line numbers to use. The pt. agreed to call crisis line numbers if having warning signs or having thoughts of suicide. Pt. listed their current anxiety and depression levels as 1/10.  Pt stated that she is feeling "okay" today.

## 2012-05-07 NOTE — Progress Notes (Signed)
D.  Pt with complaint of headache on approach.  States Tylenol has not been working for her.  Denies SI/HI/hallucinations at this time and does plan to go to evening AA group.  Interacting appropriately within milieu.  A.  Ibuprofen given for headache pain, see pain flow sheet.  Support and encouragement offered.  R.  No acute distress, will continue to monitor.

## 2012-05-07 NOTE — Discharge Planning (Signed)
Pt was seen this morning during discharge planning group.  Pt stated that she slept well and appetite is good.  Pt rated depression level at a zero and anxiety level at an zero.  Pt denies SI/HI/AVH.   

## 2012-05-07 NOTE — Progress Notes (Signed)
Reviewed document and agree with the treatment recommendations 

## 2012-05-07 NOTE — Progress Notes (Addendum)
Patient ID: Molly Perkins, female   DOB: [birth year], 21 y.o.   MRN: [id number] D: Pt is awake and active on the unit this AM. Pt denies SI/HI and A/V hallucinations. Pt is participating in the milieu and is cooperative with staff. Pt rates their depression at 1 and hopelessness at 1. Pt's most recent COWS score was 0. Pt mood is depressed/appropriate and her affect if flat. Pt is somewhat guarded but conversational with staff.  A: Writer utilized therapeutic communication, encouraged pt to discuss feelings with staff and administered medication per MD orders. Writer also encouraged pt to attend groups. Pt has some good insight into her substance abuse problem and acknowledge the need to stay off drugs after d/c. Writer encouraged pt to attend NA meeting, and she writes that her plan is to participate in a 12 step program. She shared some of her drug seeking experiences with the milieu. She expressed regret for hurting her mother, and is committed to making changes in her life.  R: Pt is attending groups and tolerating medications well. Writer will continue to monitor. 15 minute checks are ongoing for safety.

## 2012-05-07 NOTE — Progress Notes (Signed)
Psychoeducational Group Note  Date:  05/07/2012 Time:  1515  Group Topic/Focus:  Making Healthy Choices:   The focus of this group is to help patients identify negative/unhealthy choices they were using prior to admission and identify positive/healthier coping strategies to replace them upon discharge.  Participation Level:  Did Not Attend  Participation Quality: Affect:   Cognitive:  Insight:  Engagement in Group:   Additional Comments:  Taneasha Fuqua H 05/07/2012, 6:45 PM

## 2012-05-07 NOTE — Progress Notes (Signed)
Patient did attend the evening speaker AA meeting.  

## 2012-05-07 NOTE — Progress Notes (Signed)
Psychoeducational Group Note  Date:  05/07/2012 Time:  0915  Group Topic/Focus:  Goals Group:   The focus of this group is to help patients establish daily goals to achieve during treatment and discuss how the patient can incorporate goal setting into their daily lives to aide in recovery.  Participation Level:  Active  Participation Quality:  Appropriate  Affect:  Appropriate  Cognitive:  Appropriate  Insight:  Good  Engagement in Group:  Good  Additional Comments:    Molly Perkins 05/07/2012, 10:33 AM

## 2012-05-07 NOTE — Progress Notes (Signed)
Psychoeducational Group Note  Date:  05/07/2012 Time:  1015  Group Topic/Focus:  Making Healthy Choices:   The focus of this group is to help patients identify negative/unhealthy choices they were using prior to admission and identify positive/healthier coping strategies to replace them upon discharge.  Participation Level:  Active  Participation Quality:  Attentive and Sharing  Affect:  Appropriate, Blunted and Flat  Cognitive:  Alert, Appropriate and Oriented  Insight:  Good  Engagement in Group:  Good  Additional Comments:    Claritza July Ivan 05/07/2012, 12:15 PM

## 2012-05-07 NOTE — Progress Notes (Signed)
[facility] Group Notes:  (Counselor/Nursing/MHT/Case Management/Adjunct)  05/07/2012 4:16 PM  Type of Therapy: Group Therapy   Participation Level: Active   Participation Quality: Appropriate   Affect: Appropriate   Cognitive: Appropriate   Insight: Good   Engagement in Group: Good   Engagement in Therapy: Good   Modes of Intervention: Support, Socialization, Clarification   Summary of Progress/Problems:Pt. participated in group on healthy support systems by identifying the the healthy supports within their individual lives. The group focused on how to reach out to supports without feeling like a burden. Pt expressed wishing that she had more support from the people she loved instead of been ridiculed for being openly gay.   Estevan Ryder Renee 05/07/2012, 4:16 PM

## 2012-05-07 NOTE — Progress Notes (Signed)
[facility] MD Progress Note  05/07/2012 2:59 PM  Diagnosis:  Opiate dependence   ADL's:  Intact  Sleep: Good  Appetite:  Good  Suicidal Ideation:  None  Homicidal Ideation:  None   AEB (as evidenced by):  Mental Status Examination/Evaluation: Objective:  Appearance: Fairly Groomed  Eye Contact::  Good  Speech:  Normal Rate  Volume:  Normal  Mood:  Euthymic  Affect:  Congruent  Thought Process:  Clear rational goal oriented   Orientation:  Full  Thought Content:  No AVH/psychosis   Suicidal Thoughts:  No  Homicidal Thoughts:  No  Memory:  Immediate;   Good  Judgement:  Intact  Insight:  Good  Psychomotor Activity:  Normal  No evidence for opiate withdrawl  Concentration:  Good  Recall:  Good  Akathisia:  No  Handed:  Right  AIMS (if indicated):     Assets:  Communication Skills Desire for Improvement Physical Health Resilience Social Support  Sleep:  Number of Hours: 6.5    Vital Signs:Blood pressure 97/62, pulse 78, temperature 98.1 F (36.7 C), temperature source Oral, resp. rate 18, height 5\' 4"  (1.626 m), weight 62.143 kg (137 lb), last menstrual period 04/16/2012. Current Medications: Current Facility-Administered Medications  Medication Dose Route Frequency Provider Last Rate Last Dose  . acetaminophen (TYLENOL) tablet 650 mg  650 mg Oral Q6H PRN Verne Spurr, PA-C   650 mg at 05/06/12 2107  . albuterol (PROVENTIL HFA;VENTOLIN HFA) 108 (90 BASE) MCG/ACT inhaler 2 puff  2 puff Inhalation Q6H PRN Verne Spurr, PA-C      . alum & mag hydroxide-simeth (MAALOX/MYLANTA) 200-200-20 MG/5ML suspension 30 mL  30 mL Oral Q4H PRN Verne Spurr, PA-C      . cloNIDine (CATAPRES) tablet 0.1 mg  0.1 mg Oral BH-qamhs Verne Spurr, PA-C   0.1 mg at 05/06/12 2107   Followed by  . cloNIDine (CATAPRES) tablet 0.1 mg  0.1 mg Oral QAC breakfast Verne Spurr, PA-C      . dicyclomine (BENTYL) tablet 20 mg  20 mg Oral Q6H PRN Verne Spurr, PA-C      . FLUoxetine (PROZAC) capsule  20 mg  20 mg Oral Daily Verne Spurr, PA-C   20 mg at 05/07/12 0847  . hydrOXYzine (ATARAX/VISTARIL) tablet 25 mg  25 mg Oral Q6H PRN Verne Spurr, PA-C      . ibuprofen (ADVIL,MOTRIN) tablet 400 mg  400 mg Oral Q6H PRN Verne Spurr, PA-C      . loperamide (IMODIUM) capsule 2-4 mg  2-4 mg Oral PRN Verne Spurr, PA-C      . loratadine (CLARITIN) tablet 10 mg  10 mg Oral Daily Verne Spurr, PA-C   10 mg at 05/07/12 0847  . magnesium hydroxide (MILK OF MAGNESIA) suspension 30 mL  30 mL Oral Daily PRN Verne Spurr, PA-C      . methocarbamol (ROBAXIN) tablet 500 mg  500 mg Oral Q8H PRN Verne Spurr, PA-C      . nicotine (NICODERM CQ - dosed in mg/24 hours) patch 14 mg  14 mg Transdermal Daily Verne Spurr, PA-C   14 mg at 05/07/12 0800  . ondansetron (ZOFRAN-ODT) disintegrating tablet 4 mg  4 mg Oral Q6H PRN Verne Spurr, PA-C      . traZODone (DESYREL) tablet 50 mg  50 mg Oral QHS Verne Spurr, PA-C   50 mg at 05/06/12 2107    Lab Results: No results found for this or any previous visit (from the past 48 hour(s)).  Physical Findings:  AIMS: Facial and Oral Movements Muscles of Facial Expression: None, normal Lips and Perioral Area: None, normal Jaw: None, normal Tongue: None, normal,Extremity Movements Upper (arms, wrists, hands, fingers): None, normal Lower (legs, knees, ankles, toes): None, normal, Trunk Movements Neck, shoulders, hips: None, normal, Overall Severity Severity of abnormal movements (highest score from questions above): None, normal Incapacitation due to abnormal movements: None, normal Patient's awareness of abnormal movements (rate only patient's report): No Awareness, Dental Status Current problems with teeth and/or dentures?: No Does patient usually wear dentures?: No  CIWA:  CIWA-Ar Total: 0  COWS:  COWS Total Score: 0   Treatment Plan Summary: Continue current plan of care. Anticipates discharge tomorrow and plans to return home.   Odel Schmid,MICKIE  D. 05/07/2012, 2:59 PM

## 2012-05-08 DIAGNOSIS — F1220 Cannabis dependence, uncomplicated: Secondary | ICD-10-CM

## 2012-05-08 DIAGNOSIS — F329 Major depressive disorder, single episode, unspecified: Secondary | ICD-10-CM | POA: Diagnosis present

## 2012-05-08 MED ORDER — FLUOXETINE HCL 20 MG PO CAPS: 20.0000 mg | ORAL_CAPSULE | Freq: Every day | ORAL | Status: DC

## 2012-05-08 MED ORDER — LORATADINE 10 MG PO TABS: 10.0000 mg | ORAL_TABLET | Freq: Every day | ORAL | Status: DC

## 2012-05-08 MED ORDER — TRAZODONE HCL 50 MG PO TABS: ORAL_TABLET | ORAL | Status: DC

## 2012-05-08 MED ORDER — TRAZODONE HCL 50 MG PO TABS: 50.0000 mg | ORAL_TABLET | Freq: Every day | ORAL | Status: DC

## 2012-05-08 MED ORDER — FLUOXETINE HCL 20 MG PO CAPS
20.0000 mg | ORAL_CAPSULE | Freq: Every day | ORAL | Status: DC
Start: 2012-05-08 — End: 2012-05-08

## 2012-05-08 NOTE — [facility] Suicide Risk Assessment (Signed)
Suicide Risk Assessment  Discharge Assessment     Demographic Factors:  Adolescent or young adult and Unemployed  Mental Status Per Nursing Assessment::   On Admission:  Self-harm behaviors  Current Mental Status by Physician: Denies sucidal ideas. She is alert cooperative spontaneous, if full contact with reality.  Loss Factors: Decrease in vocational status and Financial problems/change in socioeconomic status  Historical Factors: Impulsivity  Risk Reduction Factors:   Positive social support and Positive coping skills or problem solving skills  Continued Clinical Symptoms:  Depression:  Mood improved, back on Prozac Alcohol/Substance Abuse: Detox, has a relapse prevention plan in place More than one psychiatric diagnosis Previous Psychiatric Diagnoses and Treatments  Cognitive Features That Contribute To Risk: None    Suicide Risk:  Minimal: No identifiable suicidal ideation.  Patients presenting with no risk factors but with morbid ruminations; may be classified as minimal risk based on the severity of the depressive symptoms  Discharge Diagnoses:   AXIS I:  Opioid Dependence, Cannabis Dependence, Depressive Disorder NOS AXIS II:  Deferred AXIS III:   Past Medical History  Diagnosis Date  . Asthma   . Depressed   . Fibromyalgia   . PTSD (post-traumatic stress disorder)    AXIS IV:  economic problems, educational problems and occupational problems AXIS V:  61-70 mild symptoms  Plan Of Care/Follow-up recommendations:  Activity:  As tolerated, Will go to NA/AA, will follow up with [facility]er  Is patient on multiple antipsychotic therapies at discharge:  No   Has Patient had three or more failed trials of antipsychotic monotherapy by history:  No  Recommended Plan for Multiple Antipsychotic Therapies: N/A  Kaylob Wallen A 05/08/2012, 11:18 AM

## 2012-05-08 NOTE — Tx Team (Signed)
Interdisciplinary Treatment Plan Update (Adult)  Date:  05/08/2012  Time Reviewed:  9:46 AM   Progress in Treatment: Attending groups: Yes Participating in groups:  Yes Taking medication as prescribed: Yes Tolerating medication:  Yes Family/Significant othe contact made:  Yes Patient understands diagnosis:  Yes Discussing patient identified problems/goals with staff:  Yes Medical problems stabilized or resolved:  Yes Denies suicidal/homicidal ideation: Yes Issues/concerns per patient self-inventory:  None identified Other: N/A  New problem(s) identified: None Identified  Reason for Continuation of Hospitalization: Stable to d/c  Interventions implemented related to continuation of hospitalization: Stable to d/c  Additional comments: N/A  Estimated length of stay: D/C today  Discharge Plan: Pt will follow up with [facility] for medication management and therapy.    New goal(s): N/A  Review of initial/current patient goals per problem list:    1.  Goal(s): Address substance use  Met:  Yes  Target date: by discharge  As evidenced by: completed detox protocol and referred to appropriate treatment  2.  Goal (s): Reduce depressive and anxiety symptoms  Met:  Yes  Target date: by discharge  As evidenced by: Reducing depression from a 10 to a 3 as reported by pt.  Pt rates at a 0 today.    Attendees: Patient:  Molly Perkins  05/08/2012 9:46 AM   Family:     Physician: Geoffery Lyons, MD 05/08/2012 9:42 AM   Nursing: Roswell Miners, RN 05/08/2012 9:42 AM   Clinical Social Worker:  Reyes Ivan, LCSWA 05/08/2012  9:42 AM   Other: Alease Frame, RN 05/08/2012  9:42 AM   Other:  Nanine Means, NP 05/08/2012 9:44 AM   Other:  Bubba Camp, psychology intern 05/08/2012 9:44 AM   Other:  Jonni Sanger, RN 05/08/2012 9:45 AM   Other:      Scribe for Treatment Team:   Reyes Ivan 05/08/2012 9:46 AM

## 2012-05-08 NOTE — Progress Notes (Signed)
Psychoeducational Group Note  Date:  05/08/2012 Time:  1000  Group Topic/Focus:  Self Care:   The focus of this group is to help patients understand the importance of self-care in order to improve or restore emotional, physical, spiritual, interpersonal, and financial health.  Participation Level:  Active  Participation Quality:  Resistant  Affect:  Flat  Cognitive:  Appropriate  Insight:  Good  Engagement in Group:  Good  Additional Comments:  NONE  Savonna Birchmeier M 05/08/2012, 12:12 PM

## 2012-05-08 NOTE — Progress Notes (Signed)
Psychoeducational Group Note  Date:  05/08/2012 Time:  1100  Group Topic/Focus:  Self Care:   The focus of this group is to help patients understand the importance of self-care in order to improve or restore emotional, physical, spiritual, interpersonal, and financial health.  Participation Level:  Did Not Attend  Participation Quality:    Affect:    Cognitive:    Insight:    Engagement in Group:    Additional Comments:  NONE  Marcine Gadway M 05/08/2012, 12:13 PM

## 2012-05-08 NOTE — Progress Notes (Signed)
[facility] Case Management Discharge Plan:  Will you be returning to the same living situation after discharge: Yes,  returning home At discharge, do you have transportation home?:Yes,  access to transportation Do you have the ability to pay for your medications:Yes,  access to meds   Release of information consent forms completed and in the chart;  Patient's signature needed at discharge.  Patient to Follow up at:  Follow-up Information    Follow up with [facility]. On 05/10/2012. (Walk in on this date at 8:00 am)    Contact information:   [address], [place] [address] [phone]         Patient denies SI/HI:   Yes,  denies SI/HI    Safety Planning and Suicide Prevention discussed:  Yes,  discussed with pt today  Barrier to discharge identified:No.   Summary and Recommendations: Pt attended discharge planning group and actively participated in group.  SW provided pt with today's workbook.  Pt presents with calm mood and affect.  Pt rates depression and anxiety at a 0 today.  Pt denies SI/HI.  No recommendations from SW.  No further needs voiced by pt.  Pt stable to discharge.     Carmina Miller 05/08/2012, 10:18 AM

## 2012-05-08 NOTE — Progress Notes (Signed)
Patient ID: Molly Perkins, female   DOB: 25-May-1991, 21 y.o.   MRN: [id number] She has been discharged home. Was going to ride bus home. She voiced understanding of discharge instruction and of follow up instructions. She denies though  of SI. Stated She was going to go back to school and plans to attend meetings. All belongings taken home with her.

## 2012-05-08 NOTE — Discharge Summary (Signed)
Physician Discharge Summary Note  Patient:  Molly Perkins is an 21 y.o., female MRN:  [id number] DOB:  August 03, 1990 Patient phone:  [phone] (home)  Patient address:   [address] [place] [address],   Date of Admission:  05/03/2012 Date of Discharge: 05/08/2012  Reason for Admission:  Opiate dependency  Discharge Diagnoses: Principal Problem:  *Opiate dependence Active Problems:  Marijuana dependence  Depressive disorder, not elsewhere classified   Axis Diagnosis:   AXIS I:  Depressive Disorder NOS, Substance abuse AXIS II:  Deferred AXIS III:   Past Medical History  Diagnosis Date  . Asthma   . Depressed   . Fibromyalgia   . PTSD (post-traumatic stress disorder)    AXIS IV:  educational problems, other psychosocial or environmental problems and problems with primary support group AXIS V:  61-70 mild symptoms  Level of Care:  OP  Hospital Course:  Patient attended individual and group therapy.  Molly also met with a MD daily and was followed by a Child psychotherapist and an extender.  Her Prozac 20 mg daily and trazodone 50 mg at bedtime were continued during inpatient and resumed after discharge.  Molly denied suicidal and homicidal ideation; auditory and visual hallucinations at discharge.  Follow-up appointments in place and patient encourage to attend.  Consults:  None  Significant Diagnostic Studies:  None  Discharge Vitals:   Blood pressure 123/70, pulse 76, temperature 98.2 F (36.8 C), temperature source Oral, resp. rate 16, height 5\' 4"  (1.626 m), weight 62.143 kg (137 lb), last menstrual period 04/16/2012.  Physical Findings: AIMS: Facial and Oral Movements Muscles of Facial Expression: None, normal Lips and Perioral Area: None, normal Jaw: None, normal Tongue: None, normal,Extremity Movements Upper (arms, wrists, hands, fingers): None, normal Lower (legs, knees, ankles, toes): None, normal, Trunk Movements Neck, shoulders, hips: None, normal,  Overall Severity Severity of abnormal movements (highest score from questions above): None, normal Incapacitation due to abnormal movements: None, normal Patient's awareness of abnormal movements (rate only patient's report): No Awareness, Dental Status Current problems with teeth and/or dentures?: No Does patient usually wear dentures?: No  CIWA:  CIWA-Ar Total: 0  COWS:  COWS Total Score: 0   Mental Status Exam: See Mental Status Examination and Suicide Risk Assessment completed by Attending Physician prior to discharge.  Discharge destination:  Home  Is patient on multiple antipsychotic therapies at discharge:  No   Has Patient had three or more failed trials of antipsychotic monotherapy by history:  No  Recommended Plan for Multiple Antipsychotic Therapies:  N/A  Discharge Orders    Future Orders Please Complete By Expires   Diet - low sodium heart healthy      Increase activity slowly      Discharge instructions      Comments:   Take all of your medications as prescribed.  Be sure to keep ALL follow up appointments as scheduled. This is to ensure getting your refills on time to avoid any interruption in your medication.  If you find that you can not keep your appointment, call the clinic and reschedule. Be sure to tell the nurse if you will need a refill before your appointment.       Medication List     As of 05/08/2012 12:42 PM    TAKE these medications      Indication    albuterol 108 (90 BASE) MCG/ACT inhaler   Commonly known as: PROVENTIL HFA;VENTOLIN HFA   Inhale 2 puffs into the lungs  every 6 (six) hours as needed.       FLUoxetine 20 MG capsule   Commonly known as: PROZAC   Take 1 capsule (20 mg total) by mouth daily. For anxiety and depression.    Indication: Depression      loratadine 10 MG tablet   Commonly known as: CLARITIN   Take 1 tablet (10 mg total) by mouth daily. Seasonal allergies       traZODone 50 MG tablet   Commonly known as: DESYREL    PRN, as needed for sleep    Indication: Trouble Sleeping           Follow-up Information    Follow up with [facility]. On 05/10/2012. (Walk in on this date at 8:00 am)    Contact information:   [address], [place] [address] [phone]         Follow-up recommendations:  Low sodium heart healthy diet and activity as tolerated  Comments:  None Signed: Dawnna Gritz 05/08/2012, 12:42 PM

## 2012-05-09 NOTE — Progress Notes (Addendum)
Patient Discharge Instructions:  After Visit Summary (AVS):   Faxed to:  05/09/2012 Psychiatric Admission Assessment Note:   Faxed to:  05/09/2012 Suicide Risk Assessment - Discharge Assessment:   Faxed to:  05/09/2012 Faxed/Sent to the [facility] Care provider:  05/09/2012 Scan update faxed 05/09/2012  Faxed to [facility] @ [phone]  Heloise Purpura Eduard Clos, 05/09/2012, 4:27 PM

## 2012-06-24 ENCOUNTER — Emergency Department (HOSPITAL_BASED_OUTPATIENT_CLINIC_OR_DEPARTMENT_OTHER)
Admission: EM | Admit: 2012-06-24 | Discharge: 2012-06-24 | Disposition: A | Discharge status: Home / Self Care | Payer: Medicaid Other | Attending: Emergency Medicine | Admitting: Emergency Medicine

## 2012-06-24 ENCOUNTER — Encounter (HOSPITAL_BASED_OUTPATIENT_CLINIC_OR_DEPARTMENT_OTHER): Payer: Self-pay | Admitting: Emergency Medicine

## 2012-06-24 DIAGNOSIS — Z8739 Personal history of other diseases of the musculoskeletal system and connective tissue: Secondary | ICD-10-CM | POA: Insufficient documentation

## 2012-06-24 DIAGNOSIS — Z79899 Other long term (current) drug therapy: Secondary | ICD-10-CM | POA: Insufficient documentation

## 2012-06-24 DIAGNOSIS — F3289 Other specified depressive episodes: Secondary | ICD-10-CM | POA: Insufficient documentation

## 2012-06-24 DIAGNOSIS — F17200 Nicotine dependence, unspecified, uncomplicated: Secondary | ICD-10-CM | POA: Insufficient documentation

## 2012-06-24 DIAGNOSIS — F329 Major depressive disorder, single episode, unspecified: Secondary | ICD-10-CM | POA: Insufficient documentation

## 2012-06-24 DIAGNOSIS — J40 Bronchitis, not specified as acute or chronic: Secondary | ICD-10-CM | POA: Insufficient documentation

## 2012-06-24 DIAGNOSIS — Z8659 Personal history of other mental and behavioral disorders: Secondary | ICD-10-CM | POA: Insufficient documentation

## 2012-06-24 DIAGNOSIS — J45909 Unspecified asthma, uncomplicated: Secondary | ICD-10-CM | POA: Insufficient documentation

## 2012-06-24 HISTORY — DX: Opioid abuse, uncomplicated: F11.10

## 2012-06-24 MED ORDER — PREDNISONE 50 MG PO TABS
ORAL_TABLET | ORAL | Status: AC
  Filled 2012-06-24: qty 1

## 2012-06-24 MED ORDER — PREDNISONE 10 MG PO TABS
ORAL_TABLET | ORAL | Status: AC
  Filled 2012-06-24: qty 1

## 2012-06-24 MED ORDER — ALBUTEROL SULFATE HFA 108 (90 BASE) MCG/ACT IN AERS
INHALATION_SPRAY | RESPIRATORY_TRACT | Status: AC
  Filled 2012-06-24: qty 6.7

## 2012-06-24 MED ORDER — PREDNISONE 10 MG PO TABS
60.0000 mg | ORAL_TABLET | Freq: Once | ORAL | Status: AC
  Administered 2012-06-24: 60 mg via ORAL

## 2012-06-24 MED ORDER — ALBUTEROL SULFATE HFA 108 (90 BASE) MCG/ACT IN AERS
2.0000 | INHALATION_SPRAY | RESPIRATORY_TRACT | Status: DC | PRN
  Administered 2012-06-24: 2 via RESPIRATORY_TRACT

## 2012-06-24 MED ORDER — PREDNISONE 20 MG PO TABS: 40.0000 mg | ORAL_TABLET | Freq: Every day | ORAL | Status: DC

## 2012-06-24 NOTE — ED Notes (Signed)
Pt is not taking any of her psych meds or asthma medications because she doesn't think she needs them any more. Pt strongly advised to take medications as she is prescribed and to not suddenly stop without orders from her doctor.

## 2012-06-24 NOTE — ED Notes (Signed)
Pt c/o cough producing yellow and green sputum x 1 week

## 2012-06-24 NOTE — ED Provider Notes (Signed)
History     CSN: [id number]  Arrival date & time 06/24/12  0320   First MD Initiated Contact with Patient 06/24/12 [phone]      Chief Complaint  Patient presents with  . Cough    (Consider location/radiation/quality/duration/timing/severity/associated sxs/prior treatment) Patient is a 21 y.o. female presenting with cough. The history is provided by the patient.  Cough This is a new problem. The current episode started more than 1 week ago. The problem has been gradually worsening. The cough is productive of sputum. There has been no fever. Associated symptoms include chest pain, chills and rhinorrhea. Pertinent negatives include no shortness of breath and no wheezing. She has tried decongestants for the symptoms. The treatment provided mild relief. She is a smoker. Her past medical history is significant for bronchitis and asthma.    Past Medical History  Diagnosis Date  . Asthma   . Depressed   . Fibromyalgia   . PTSD (post-traumatic stress disorder)   . Opiate abuse, continuous     History reviewed. No pertinent past surgical history.  Family History  Problem Relation Age of Onset  . Diabetes Other     History  Substance Use Topics  . Smoking status: Current Every Day Smoker -- 0.2 packs/day for 3 years    Types: Cigarettes  . Smokeless tobacco: Current User  . Alcohol Use: 0.6 oz/week    1 Cans of beer per week     Comment: occa liquor and beer    OB History    Grav Para Term Preterm Abortions TAB SAB Ect Mult Living                  Review of Systems  Constitutional: Positive for chills.  HENT: Positive for rhinorrhea.   Respiratory: Positive for cough. Negative for shortness of breath and wheezing.   Cardiovascular: Positive for chest pain.  Psychiatric/Behavioral:       Hx of depression  All other systems reviewed and are negative.    Allergies  Bee venom  Home Medications   Current Outpatient Rx  Name  Route  Sig  Dispense  Refill  . ALBUTEROL  SULFATE HFA 108 (90 BASE) MCG/ACT IN AERS   Inhalation   Inhale 2 puffs into the lungs every 6 (six) hours as needed.         Marland Kitchen FLUOXETINE HCL 20 MG PO CAPS   Oral   Take 1 capsule (20 mg total) by mouth daily. For anxiety and depression.   30 capsule   0   . LORATADINE 10 MG PO TABS   Oral   Take 1 tablet (10 mg total) by mouth daily. Seasonal allergies         . PREDNISONE 20 MG PO TABS   Oral   Take 2 tablets (40 mg total) by mouth daily.   10 tablet   0   . TRAZODONE HCL 50 MG PO TABS      PRN, as needed for sleep   14 tablet   0     BP 122/75  Pulse 87  Temp 98 F (36.7 C) (Oral)  Resp 18  Ht 5\' 4"  (1.626 m)  Wt 140 lb (63.504 kg)  BMI 24.03 kg/m2  SpO2 100%  Physical Exam  Nursing note and vitals reviewed. Constitutional: She is oriented to person, place, and time. She appears well-developed and well-nourished. No distress.  HENT:  Head: Normocephalic and atraumatic.  Right Ear: Tympanic membrane and ear canal normal.  Left Ear: Tympanic membrane and ear canal normal.  Mouth/Throat: Oropharynx is clear and moist and mucous membranes are normal. No oropharyngeal exudate, posterior oropharyngeal edema, posterior oropharyngeal erythema or tonsillar abscesses.  Eyes: Conjunctivae normal and EOM are normal. Pupils are equal, round, and reactive to light.  Neck: Normal range of motion. Neck supple.  Cardiovascular: Normal rate, regular rhythm and intact distal pulses.   No murmur heard. Pulmonary/Chest: Effort normal and breath sounds normal. No respiratory distress. She has no wheezes. She has no rales.  Abdominal: Soft. She exhibits no distension. There is no tenderness. There is no rebound and no guarding.  Musculoskeletal: Normal range of motion. She exhibits no edema and no tenderness.  Lymphadenopathy:    She has no cervical adenopathy.  Neurological: She is alert and oriented to person, place, and time.  Skin: Skin is warm and dry. No rash noted. No  erythema.  Psychiatric: She has a normal mood and affect. Her behavior is normal.    ED Course  Procedures (including critical care time)  Labs Reviewed - No data to display No results found.   1. Bronchitis       MDM   Pt with symptoms consistent with viral URI/bronchitis but also with hx of asthma and has not been on any meds for several months.  Well appearing here.  No signs of breathing difficulty  No signs of pharyngitis, otitis or abnormal abdominal findings.   Given inhaler and prednisone.         Gwyneth Sprout, MD 06/24/12 [phone]

## 2012-06-29 ENCOUNTER — Emergency Department (HOSPITAL_COMMUNITY)
Admission: EM | Admit: 2012-06-29 | Discharge: 2012-06-29 | Disposition: A | Discharge status: Home / Self Care | Payer: Medicaid Other | Attending: Emergency Medicine | Admitting: Emergency Medicine

## 2012-06-29 ENCOUNTER — Encounter (HOSPITAL_COMMUNITY): Payer: Self-pay | Admitting: Emergency Medicine

## 2012-06-29 ENCOUNTER — Emergency Department (HOSPITAL_COMMUNITY): Payer: Medicaid Other

## 2012-06-29 DIAGNOSIS — J069 Acute upper respiratory infection, unspecified: Secondary | ICD-10-CM | POA: Insufficient documentation

## 2012-06-29 DIAGNOSIS — Z8739 Personal history of other diseases of the musculoskeletal system and connective tissue: Secondary | ICD-10-CM | POA: Insufficient documentation

## 2012-06-29 DIAGNOSIS — Z8659 Personal history of other mental and behavioral disorders: Secondary | ICD-10-CM | POA: Insufficient documentation

## 2012-06-29 DIAGNOSIS — F1210 Cannabis abuse, uncomplicated: Secondary | ICD-10-CM | POA: Insufficient documentation

## 2012-06-29 DIAGNOSIS — Z79899 Other long term (current) drug therapy: Secondary | ICD-10-CM | POA: Insufficient documentation

## 2012-06-29 DIAGNOSIS — J45909 Unspecified asthma, uncomplicated: Secondary | ICD-10-CM | POA: Insufficient documentation

## 2012-06-29 DIAGNOSIS — F17200 Nicotine dependence, unspecified, uncomplicated: Secondary | ICD-10-CM | POA: Insufficient documentation

## 2012-06-29 DIAGNOSIS — R509 Fever, unspecified: Secondary | ICD-10-CM | POA: Insufficient documentation

## 2012-06-29 DIAGNOSIS — R062 Wheezing: Secondary | ICD-10-CM | POA: Insufficient documentation

## 2012-06-29 DIAGNOSIS — J3489 Other specified disorders of nose and nasal sinuses: Secondary | ICD-10-CM | POA: Insufficient documentation

## 2012-06-29 MED ORDER — AZITHROMYCIN 250 MG PO TABS: 250.0000 mg | ORAL_TABLET | Freq: Every day | ORAL | Status: DC

## 2012-06-29 MED ORDER — ALBUTEROL SULFATE HFA 108 (90 BASE) MCG/ACT IN AERS
2.0000 | INHALATION_SPRAY | RESPIRATORY_TRACT | Status: DC | PRN
  Administered 2012-06-29: 2 via RESPIRATORY_TRACT
  Filled 2012-06-29: qty 6.7

## 2012-06-29 MED ORDER — AZITHROMYCIN 250 MG PO TABS
500.0000 mg | ORAL_TABLET | Freq: Once | ORAL | Status: AC
  Administered 2012-06-29: 500 mg via ORAL
  Filled 2012-06-29 (×2): qty 1

## 2012-06-29 NOTE — ED Provider Notes (Signed)
History     CSN: [id number]  Arrival date & time 06/29/12  1032   First MD Initiated Contact with Patient 06/29/12 1112      Chief Complaint  Patient presents with  . Cough  . Nasal Congestion    (Consider location/radiation/quality/duration/timing/severity/associated sxs/prior treatment) Patient is a 21 y.o. female presenting with cough. The history is provided by the patient.  Cough This is a new problem. The current episode started more than 1 week ago. The problem occurs constantly. The cough is productive of sputum. The maximum temperature recorded prior to her arrival was 101 to 101.9 F. Associated symptoms include chills and wheezing. Pertinent negatives include no rhinorrhea, no sore throat and no myalgias. She is not a smoker.    Past Medical History  Diagnosis Date  . Asthma   . Depressed   . Fibromyalgia   . PTSD (post-traumatic stress disorder)   . Opiate abuse, continuous     History reviewed. No pertinent past surgical history.  Family History  Problem Relation Age of Onset  . Diabetes Other     History  Substance Use Topics  . Smoking status: Current Every Day Smoker -- 0.2 packs/day for 3 years    Types: Cigarettes  . Smokeless tobacco: Current User  . Alcohol Use: 0.6 oz/week    1 Cans of beer per week     Comment: occa liquor and beer    OB History    Grav Para Term Preterm Abortions TAB SAB Ect Mult Living                  Review of Systems  Constitutional: Positive for fever and chills.  HENT: Negative for congestion, sore throat, rhinorrhea and sinus pressure.   Respiratory: Positive for cough and wheezing.   Cardiovascular: Negative.   Gastrointestinal: Negative.  Negative for nausea.  Musculoskeletal: Negative.  Negative for myalgias.  Skin: Negative.  Negative for rash.    Allergies  Bee venom  Home Medications   Current Outpatient Rx  Name  Route  Sig  Dispense  Refill  . ALBUTEROL SULFATE HFA 108 (90 BASE) MCG/ACT IN  AERS   Inhalation   Inhale 2 puffs into the lungs every 6 (six) hours as needed. For shortness of breath.         Marland Kitchen LORATADINE 10 MG PO TABS   Oral   Take 10 mg by mouth daily as needed. For allergies.           BP 121/72  Pulse 80  Temp 98.4 F (36.9 C) (Oral)  Resp 16  SpO2 98%  LMP 05/28/2012  Physical Exam  Constitutional: She is oriented to person, place, and time. She appears well-developed and well-nourished.  HENT:  Head: Normocephalic.  Mouth/Throat: Oropharynx is clear and moist.  Eyes: Right eye exhibits no discharge. Left eye exhibits no discharge.  Neck: Normal range of motion. Neck supple.  Cardiovascular: Normal rate and regular rhythm.   No murmur heard. Pulmonary/Chest: Effort normal and breath sounds normal. She has no wheezes. She has no rales.  Abdominal: Soft. Bowel sounds are normal. There is no tenderness. There is no rebound and no guarding.  Musculoskeletal: Normal range of motion.  Neurological: She is alert and oriented to person, place, and time.  Skin: Skin is warm and dry. No rash noted.  Psychiatric: She has a normal mood and affect.    ED Course  Procedures (including critical care time)  Labs Reviewed - No data to  display Dg Chest 2 View  06/29/2012  *RADIOLOGY REPORT*  Clinical Data: Cough and congestion  CHEST - 2 VIEW  Comparison: June 10, 2011  Findings:  Lungs clear.  Heart size and pulmonary vascularity are normal.  No adenopathy.  There is mild upper thoracic levoscoliosis.  IMPRESSION: No edema or consolidation.   Original Report Authenticated By: Bretta Bang, M.D.      No diagnosis found.  1. Glenford Peers    MDM  URI symptoms x 2 weeks, productive cough now with fever x 2 days. Will trx with abx and encourage PCP follow up.        Rodena Medin, PA-C 06/29/12 1144

## 2012-06-29 NOTE — ED Notes (Signed)
Patent c/o cough and nasal congestion since 12/7.  Patient reports yellow mucus.  Patient denies fevers but reports chills.

## 2012-06-29 NOTE — ED Notes (Signed)
Patient transported to X-ray 

## 2012-06-29 NOTE — ED Provider Notes (Signed)
Medical screening examination/treatment/procedure(s) were performed by non-physician practitioner and as supervising physician I was immediately available for consultation/collaboration.  Donnetta Hutching, MD 06/29/12 [phone]

## 2012-07-05 ENCOUNTER — Encounter (HOSPITAL_COMMUNITY): Payer: Self-pay | Admitting: *Deleted

## 2012-07-05 ENCOUNTER — Emergency Department (HOSPITAL_COMMUNITY)
Admission: EM | Admit: 2012-07-05 | Discharge: 2012-07-05 | Disposition: A | Discharge status: Home / Self Care | Payer: Medicaid Other | Attending: Emergency Medicine | Admitting: Emergency Medicine

## 2012-07-05 DIAGNOSIS — Y9389 Activity, other specified: Secondary | ICD-10-CM | POA: Insufficient documentation

## 2012-07-05 DIAGNOSIS — Z3202 Encounter for pregnancy test, result negative: Secondary | ICD-10-CM | POA: Insufficient documentation

## 2012-07-05 DIAGNOSIS — F1110 Opioid abuse, uncomplicated: Secondary | ICD-10-CM | POA: Insufficient documentation

## 2012-07-05 DIAGNOSIS — F1910 Other psychoactive substance abuse, uncomplicated: Secondary | ICD-10-CM

## 2012-07-05 DIAGNOSIS — Y92009 Unspecified place in unspecified non-institutional (private) residence as the place of occurrence of the external cause: Secondary | ICD-10-CM | POA: Insufficient documentation

## 2012-07-05 DIAGNOSIS — Z792 Long term (current) use of antibiotics: Secondary | ICD-10-CM | POA: Insufficient documentation

## 2012-07-05 DIAGNOSIS — F17200 Nicotine dependence, unspecified, uncomplicated: Secondary | ICD-10-CM | POA: Insufficient documentation

## 2012-07-05 DIAGNOSIS — T391X1A Poisoning by 4-Aminophenol derivatives, accidental (unintentional), initial encounter: Secondary | ICD-10-CM | POA: Insufficient documentation

## 2012-07-05 DIAGNOSIS — Z8659 Personal history of other mental and behavioral disorders: Secondary | ICD-10-CM | POA: Insufficient documentation

## 2012-07-05 DIAGNOSIS — J45909 Unspecified asthma, uncomplicated: Secondary | ICD-10-CM | POA: Insufficient documentation

## 2012-07-05 DIAGNOSIS — Z8739 Personal history of other diseases of the musculoskeletal system and connective tissue: Secondary | ICD-10-CM | POA: Insufficient documentation

## 2012-07-05 DIAGNOSIS — Z79899 Other long term (current) drug therapy: Secondary | ICD-10-CM | POA: Insufficient documentation

## 2012-07-05 LAB — COMPREHENSIVE METABOLIC PANEL
Albumin: 3.5 g/dL (ref 3.5–5.2)
Alkaline Phosphatase: 62 U/L (ref 39–117)
BUN: 13 mg/dL (ref 6–23)
Chloride: 100 mEq/L (ref 96–112)
Potassium: 3.9 mEq/L (ref 3.5–5.1)
Total Bilirubin: 0.1 mg/dL — ABNORMAL LOW (ref 0.3–1.2)

## 2012-07-05 LAB — CBC WITH DIFFERENTIAL/PLATELET
Basophils Relative: 0 % (ref 0–1)
Hemoglobin: 11.8 g/dL — ABNORMAL LOW (ref 12.0–15.0)
Lymphs Abs: 3.8 10*3/uL (ref 0.7–4.0)
MCHC: 32 g/dL (ref 30.0–36.0)
Monocytes Relative: 5 % (ref 3–12)
Neutro Abs: 4.4 10*3/uL (ref 1.7–7.7)
Neutrophils Relative %: 49 % (ref 43–77)
RBC: 4.56 MIL/uL (ref 3.87–5.11)
WBC: 9 10*3/uL (ref 4.0–10.5)

## 2012-07-05 LAB — RAPID URINE DRUG SCREEN, HOSP PERFORMED
Amphetamines: NOT DETECTED
Barbiturates: NOT DETECTED
Benzodiazepines: NOT DETECTED

## 2012-07-05 LAB — ACETAMINOPHEN LEVEL: Acetaminophen (Tylenol), Serum: <15 ug/mL (ref 10–30)

## 2012-07-05 LAB — PREGNANCY, URINE: Preg Test, Ur: NEGATIVE

## 2012-07-05 NOTE — ED Provider Notes (Signed)
History     CSN: [id number]  Arrival date & time 07/05/12  0000   First MD Initiated Contact with Patient 07/05/12 0001      No chief complaint on file.   (Consider location/radiation/quality/duration/timing/severity/associated sxs/prior treatment) HPI Comments: Pt has hx of MDD - has been on meds in past but stopped taking 2 weeks ago and stopped lyrica for her fibromyalgia - she has been abusing her mothers percocet.  Mother states that she wants to go into the [employer] and mother has been telling her that she can't b/c of military problems.  She had spent a large amount of money at the beginning of November but unsure on what.  She had been using her mother's percocet and crushing it and smoking it with her MJ.  She admits to being drunk and high occasionally - she was kicked out of her mothers house - mother wants her to go to therapy / psychiatry every week but she went to family members house who let her sleep in a walk in closet for the last month.  She has been to the hospital for respiratory complaints frequently over the last month.    Has hx of cutting  Mother has let her come home to the house - and when mother came home tonight, she found her sleeping in the bedroom - she checked on her (mothers) pain medicine and she couldn't find her pain medicines - found on the kitchen counter with pain medicines missing.  She had taken 15 tablets this evening.  Pt admitted to taking the medicines.    Had been taken to [facility] b/c of MDD - wrote letter to the [employer] recruiter - she is unsure what the letter said.  The patient denies suicidal thoughts to me he denies that this was an overdose but states that she has been taking her mothers Percocet for some time secondary to the way makes her feel.  The history is provided by the patient, a relative and medical records.    Past Medical History  Diagnosis Date  . Asthma   . Depressed   . Fibromyalgia   . PTSD  (post-traumatic stress disorder)   . Opiate abuse, continuous     History reviewed. No pertinent past surgical history.  Family History  Problem Relation Age of Onset  . Diabetes Other     History  Substance Use Topics  . Smoking status: Current Every Day Smoker -- 0.2 packs/day for 3 years    Types: Cigarettes  . Smokeless tobacco: Current User  . Alcohol Use: 0.6 oz/week    1 Cans of beer per week     Comment: occa liquor and beer    OB History    Grav Para Term Preterm Abortions TAB SAB Ect Mult Living                  Review of Systems  All other systems reviewed and are negative.    Allergies  Bee venom  Home Medications   Current Outpatient Rx  Name  Route  Sig  Dispense  Refill  . ALBUTEROL SULFATE HFA 108 (90 BASE) MCG/ACT IN AERS   Inhalation   Inhale 2 puffs into the lungs every 6 (six) hours as needed. For shortness of breath.         . AZITHROMYCIN 250 MG PO TABS   Oral   Take 1 tablet (250 mg total) by mouth daily.   4 tablet   0   .  LORATADINE 10 MG PO TABS   Oral   Take 10 mg by mouth daily as needed. For allergies.           BP 146/96  Temp 98.1 F (36.7 C) (Oral)  Resp 20  SpO2 100%  LMP 05/28/2012  Physical Exam  Nursing note and vitals reviewed. Constitutional: She appears well-developed and well-nourished. No distress.  HENT:  Head: Normocephalic and atraumatic.  Mouth/Throat: Oropharynx is clear and moist. No oropharyngeal exudate.  Eyes: Conjunctivae normal and EOM are normal. Pupils are equal, round, and reactive to light. Right eye exhibits no discharge. Left eye exhibits no discharge. No scleral icterus.  Neck: Normal range of motion. Neck supple. No JVD present. No thyromegaly present.  Cardiovascular: Normal rate, regular rhythm, normal heart sounds and intact distal pulses.  Exam reveals no gallop and no friction rub.   No murmur heard. Pulmonary/Chest: Effort normal and breath sounds normal. No respiratory  distress. She has no wheezes. She has no rales.  Abdominal: Soft. Bowel sounds are normal. She exhibits no distension and no mass. There is no tenderness.  Musculoskeletal: Normal range of motion. She exhibits no edema and no tenderness.  Lymphadenopathy:    She has no cervical adenopathy.  Neurological: She is alert. Coordination normal.  Skin: Skin is warm and dry. No rash noted. No erythema.  Psychiatric:       Depressed affect, tearful, no hallucinations, no suicidal thoughts    ED Course  Procedures (including critical care time)   Labs Reviewed  CBC WITH DIFFERENTIAL  COMPREHENSIVE METABOLIC PANEL  ACETAMINOPHEN LEVEL  SALICYLATE LEVEL  URINE RAPID DRUG SCREEN (HOSP PERFORMED)  PREGNANCY, URINE  ETHANOL   No results found.   No diagnosis found.    MDM  The patient only admits to taking 6 tablets of Percocet, mother told me that the patient admitted to 15 Percocets. She denied to her mother were to me that this was a history of self-harm and she maintains this claimed to me. she has been off her medications for approximately 2-4 weeks, she will need psychiatric evaluation.  ED ECG REPORT  I personally interpreted this EKG   Date: 07/05/2012   Rate: 93  Rhythm: normal sinus rhythm  QRS Axis: normal  Intervals: normal  ST/T Wave abnormalities: Mild diffuse ST elevation, consistent with prior EKG  Conduction Disutrbances:none  Narrative Interpretation:   Old EKG Reviewed: Compared with 04/18/2010, no significant changes, ST elevations persist   The case was discussed with the psychiatrist Dr. Jacky Kindle who has evaluated the patient and deemed that she did not meet inpatient criteria, she has been given outpatient referrals. The patient is not rest herself or others,      Vida Roller, MD 07/05/12 [phone]

## 2012-07-05 NOTE — ED Notes (Signed)
Mom's phone number is [phone] (H), (C) [phone].

## 2012-07-05 NOTE — ED Notes (Signed)
Per mother pt took 20 Percocet 5/325 2 hours prior to arrival in attempt to harm self; previous history of same; off psych meds x 6 wks; pt lethargic on arrival; tearful;

## 2012-07-05 NOTE — ED Notes (Signed)
Pt states that she took Percocet overdose "to get high"---- pt denies suicidal attempt or SI.

## 2012-07-05 NOTE — ED Notes (Signed)
Mom sts that she is going home, and call her if she needs them.

## 2012-10-07 ENCOUNTER — Emergency Department (HOSPITAL_BASED_OUTPATIENT_CLINIC_OR_DEPARTMENT_OTHER)
Admission: EM | Admit: 2012-10-07 | Discharge: 2012-10-07 | Disposition: A | Discharge status: Home / Self Care | Payer: Medicaid Other | Attending: Emergency Medicine | Admitting: Emergency Medicine

## 2012-10-07 ENCOUNTER — Encounter (HOSPITAL_BASED_OUTPATIENT_CLINIC_OR_DEPARTMENT_OTHER): Payer: Self-pay | Admitting: Emergency Medicine

## 2012-10-07 DIAGNOSIS — M545 Low back pain, unspecified: Secondary | ICD-10-CM | POA: Insufficient documentation

## 2012-10-07 DIAGNOSIS — F329 Major depressive disorder, single episode, unspecified: Secondary | ICD-10-CM | POA: Insufficient documentation

## 2012-10-07 DIAGNOSIS — M549 Dorsalgia, unspecified: Secondary | ICD-10-CM

## 2012-10-07 DIAGNOSIS — Z8739 Personal history of other diseases of the musculoskeletal system and connective tissue: Secondary | ICD-10-CM | POA: Insufficient documentation

## 2012-10-07 DIAGNOSIS — Z79899 Other long term (current) drug therapy: Secondary | ICD-10-CM | POA: Insufficient documentation

## 2012-10-07 DIAGNOSIS — F17200 Nicotine dependence, unspecified, uncomplicated: Secondary | ICD-10-CM | POA: Insufficient documentation

## 2012-10-07 DIAGNOSIS — F3289 Other specified depressive episodes: Secondary | ICD-10-CM | POA: Insufficient documentation

## 2012-10-07 DIAGNOSIS — Z8659 Personal history of other mental and behavioral disorders: Secondary | ICD-10-CM | POA: Insufficient documentation

## 2012-10-07 DIAGNOSIS — J45909 Unspecified asthma, uncomplicated: Secondary | ICD-10-CM | POA: Insufficient documentation

## 2012-10-07 MED ORDER — HYDROCODONE-ACETAMINOPHEN 5-325 MG PO TABS: 2.0000 | ORAL_TABLET | ORAL | Status: DC | PRN

## 2012-10-07 MED ORDER — METHOCARBAMOL 500 MG PO TABS: 500.0000 mg | ORAL_TABLET | Freq: Two times a day (BID) | ORAL | Status: DC

## 2012-10-07 NOTE — ED Notes (Signed)
Pt has history of lower back pain but approximately two weeks ago, she fell while skate boarding.

## 2012-10-07 NOTE — ED Provider Notes (Signed)
Medical screening examination/treatment/procedure(s) were performed by non-physician practitioner and as supervising physician I was immediately available for consultation/collaboration.   Rolan Bucco, MD 10/07/12 1455

## 2012-10-07 NOTE — ED Provider Notes (Signed)
History     CSN: [id number]  Arrival date & time 10/07/12  1126   First MD Initiated Contact with Patient 10/07/12 1202      Chief Complaint  Patient presents with  . Back Pain    (Consider location/radiation/quality/duration/timing/severity/associated sxs/prior treatment) HPI Comments: Pt presents to the ED for back pain x 2 weeks.  States she fell on her butt while skateboarding but has been having increased low back pain since the incident.  No head trauma or LOC.  States she took one of her brothers hydrocodone which helped some. Denies any numbness or tingling in the LE.  No loss of bowel or bladder function.  Patient is a 22 y.o. female presenting with back pain. The history is provided by the patient.  Back Pain   Past Medical History  Diagnosis Date  . Asthma   . Depressed   . Fibromyalgia   . PTSD (post-traumatic stress disorder)   . Opiate abuse, continuous     History reviewed. No pertinent past surgical history.  Family History  Problem Relation Age of Onset  . Diabetes Other     History  Substance Use Topics  . Smoking status: Current Every Day Smoker -- 0.25 packs/day for 3 years    Types: Cigarettes  . Smokeless tobacco: Current User  . Alcohol Use: 0.6 oz/week    1 Cans of beer per week     Comment: occa liquor and beer    OB History   Grav Para Term Preterm Abortions TAB SAB Ect Mult Living                  Review of Systems  Musculoskeletal: Positive for back pain.  All other systems reviewed and are negative.    Allergies  Bee venom  Home Medications   Current Outpatient Rx  Name  Route  Sig  Dispense  Refill  . EPINEPHrine (EPIPEN JR) 0.15 MG/0.3ML injection   Intramuscular   Inject 0.15 mg into the muscle as needed for anaphylaxis.         Marland Kitchen FLUoxetine (PROZAC) 20 MG capsule   Oral   Take 20 mg by mouth daily.         . QUEtiapine (SEROQUEL) 100 MG tablet   Oral   Take 100 mg by mouth at bedtime.         Marland Kitchen  albuterol (PROVENTIL HFA;VENTOLIN HFA) 108 (90 BASE) MCG/ACT inhaler   Inhalation   Inhale 2 puffs into the lungs every 6 (six) hours as needed. For shortness of breath.           BP 101/78  Pulse 88  Temp(Src) 98.3 F (36.8 C) (Oral)  Resp 22  Ht 5\' 5"  (1.651 m)  Wt 156 lb (70.761 kg)  BMI 25.96 kg/m2  SpO2 100%  LMP 09/29/2012  Physical Exam  Nursing note and vitals reviewed. Constitutional: She is oriented to person, place, and time. She appears well-developed and well-nourished.  HENT:  Head: Normocephalic and atraumatic.  Eyes: Conjunctivae and EOM are normal.  Neck: Normal range of motion. Neck supple.  Cardiovascular: Normal rate, regular rhythm, normal heart sounds and intact distal pulses.   Pulmonary/Chest: Effort normal and breath sounds normal. She has no wheezes.  Musculoskeletal: She exhibits no edema.       Lumbar back: She exhibits tenderness and spasm. She exhibits normal range of motion, no bony tenderness, no edema and no deformity.  BL lumber muscle spasms, BLE sensation intact, normal strength,  strong distal pulses  Neurological: She is alert and oriented to person, place, and time. She has normal strength. No cranial nerve deficit or sensory deficit. Gait normal.  Skin: Skin is warm and dry.  Psychiatric: She has a normal mood and affect.    ED Course  Procedures (including critical care time)  Labs Reviewed - No data to display No results found.   1. Back pain       MDM   22 y.o. Female presenting with increased low back pain after falling from a skateboard approx 2 weeks ago.  No direct trauma to her back. Pt states she has chronic back problems and fibromyalgia. LS is without bony tenderness or deformity, but bilateral lumbar spasms are present.  Low suspicion for acute fx or lumbar disc herniation, will defer x-rays at this time.  Pt has FU with orthopedic in approx 2 weeks.  Encouraged to keep that appointment.  Rx vicodin and robaxin for  home.  Instructed about NSAIDs and heating pad for added relief.  Return precautions advised.        Garlon Hatchet, PA-C 10/07/12 1225

## 2012-10-15 ENCOUNTER — Emergency Department (HOSPITAL_BASED_OUTPATIENT_CLINIC_OR_DEPARTMENT_OTHER)
Admission: EM | Admit: 2012-10-15 | Discharge: 2012-10-15 | Disposition: A | Discharge status: Home / Self Care | Payer: Medicaid Other | Attending: Emergency Medicine | Admitting: Emergency Medicine

## 2012-10-15 ENCOUNTER — Encounter (HOSPITAL_BASED_OUTPATIENT_CLINIC_OR_DEPARTMENT_OTHER): Payer: Self-pay | Admitting: *Deleted

## 2012-10-15 DIAGNOSIS — J45909 Unspecified asthma, uncomplicated: Secondary | ICD-10-CM | POA: Insufficient documentation

## 2012-10-15 DIAGNOSIS — Z79899 Other long term (current) drug therapy: Secondary | ICD-10-CM | POA: Insufficient documentation

## 2012-10-15 DIAGNOSIS — Z87828 Personal history of other (healed) physical injury and trauma: Secondary | ICD-10-CM | POA: Insufficient documentation

## 2012-10-15 DIAGNOSIS — F17200 Nicotine dependence, unspecified, uncomplicated: Secondary | ICD-10-CM | POA: Insufficient documentation

## 2012-10-15 DIAGNOSIS — F3289 Other specified depressive episodes: Secondary | ICD-10-CM | POA: Insufficient documentation

## 2012-10-15 DIAGNOSIS — G8929 Other chronic pain: Secondary | ICD-10-CM | POA: Insufficient documentation

## 2012-10-15 DIAGNOSIS — F4310 Post-traumatic stress disorder, unspecified: Secondary | ICD-10-CM | POA: Insufficient documentation

## 2012-10-15 DIAGNOSIS — M549 Dorsalgia, unspecified: Secondary | ICD-10-CM

## 2012-10-15 DIAGNOSIS — IMO0001 Reserved for inherently not codable concepts without codable children: Secondary | ICD-10-CM | POA: Insufficient documentation

## 2012-10-15 DIAGNOSIS — F329 Major depressive disorder, single episode, unspecified: Secondary | ICD-10-CM | POA: Insufficient documentation

## 2012-10-15 MED ORDER — PREDNISONE 20 MG PO TABS: ORAL_TABLET | ORAL | Status: DC

## 2012-10-15 MED ORDER — DICLOFENAC SODIUM 75 MG PO TBEC: 75.0000 mg | DELAYED_RELEASE_TABLET | Freq: Two times a day (BID) | ORAL | Status: DC

## 2012-10-15 NOTE — ED Provider Notes (Signed)
History    This chart was scribed for Gilda Crease, MD scribed by Magnus Sinning. The patient was seen in room MH03/MH03 at 15:45   CSN: [id number]  Arrival date & time 10/15/12  1529      Chief Complaint  Patient presents with  . Back Pain    (Consider location/radiation/quality/duration/timing/severity/associated sxs/prior treatment) Patient is a 22 y.o. female presenting with back pain. The history is provided by the patient. No language interpreter was used.  Back Pain  Molly Perkins is a 22 y.o. female who presents to the Emergency Department complaining of constant moderate back pain that radiates into bilateral thighs, onset one week.  The patient states that she fell last week, which caused re-injury to hx of back pain.  She says that she was dx'd with fibromyalgia two years ago and she reports that lab work up and radiology was done, but unable to provide treatment toward pain resolution. She was seen here last week, but is reportedly still having back pain.   Orthopedist: Dr. Sharene Skeans Past Medical History  Diagnosis Date  . Asthma   . Depressed   . Fibromyalgia   . PTSD (post-traumatic stress disorder)   . Opiate abuse, continuous     History reviewed. No pertinent past surgical history.  Family History  Problem Relation Age of Onset  . Diabetes Other     History  Substance Use Topics  . Smoking status: Current Every Day Smoker -- 0.25 packs/day for 3 years    Types: Cigarettes  . Smokeless tobacco: Current User  . Alcohol Use: 0.6 oz/week    1 Cans of beer per week     Comment: occa liquor and beer     Review of Systems  Musculoskeletal: Positive for back pain.  All other systems reviewed and are negative.    Allergies  Bee venom  Home Medications   Current Outpatient Rx  Name  Route  Sig  Dispense  Refill  . albuterol (PROVENTIL HFA;VENTOLIN HFA) 108 (90 BASE) MCG/ACT inhaler   Inhalation   Inhale 2 puffs into the lungs every 6  (six) hours as needed. For shortness of breath.         . EPINEPHrine (EPIPEN JR) 0.15 MG/0.3ML injection   Intramuscular   Inject 0.15 mg into the muscle as needed for anaphylaxis.         Marland Kitchen FLUoxetine (PROZAC) 20 MG capsule   Oral   Take 20 mg by mouth daily.         Marland Kitchen HYDROcodone-acetaminophen (NORCO/VICODIN) 5-325 MG per tablet   Oral   Take 2 tablets by mouth every 4 (four) hours as needed for pain.   10 tablet   0   . methocarbamol (ROBAXIN) 500 MG tablet   Oral   Take 1 tablet (500 mg total) by mouth 2 (two) times daily.   14 tablet   0   . QUEtiapine (SEROQUEL) 100 MG tablet   Oral   Take 100 mg by mouth at bedtime.           BP 113/66  Pulse 88  Temp(Src) 102 F (38.9 C) (Oral)  Resp 18  Ht 5\' 5"  (1.651 m)  Wt 156 lb (70.761 kg)  BMI 25.96 kg/m2  SpO2 99%  LMP 09/29/2012  Physical Exam  Nursing note and vitals reviewed. Constitutional: She is oriented to person, place, and time. She appears well-developed and well-nourished. No distress.  HENT:  Head: Normocephalic and atraumatic.  Eyes: Conjunctivae  and EOM are normal.  Neck: Neck supple. No tracheal deviation present.  Cardiovascular: Normal rate.   Pulmonary/Chest: Effort normal. No respiratory distress.  Abdominal: She exhibits no distension.  Musculoskeletal: Normal range of motion. She exhibits tenderness. She exhibits no edema.       Lumbar back: She exhibits no bony tenderness.  Tenderness in the lower back, soft tissue  Neurological: She is alert and oriented to person, place, and time. She has normal strength and normal reflexes. No sensory deficit.  Skin: Skin is warm and dry.  Psychiatric: She has a normal mood and affect. Her behavior is normal.    ED Course  Procedures (including critical care time) DIAGNOSTIC STUDIES: Oxygen Saturation is 99% on room air, normal by my interpretation.    COORDINATION OF CARE: 15:46: Physical exam performed.  Labs Reviewed - No data to  display No results found.   Diagnosis: Chronic pain exacerbation    MDM  Patient comes to the ER for evaluation of low back pain. Patient reports that she fell a week ago. She was seen in the ER and given pain medication. She reports that the pain has not improved. Pain is in the lower back it radiates down both hips to the legs. No numbness, tingling, weakness in the lower extremities.  I did recommend x-ray for the patient because she is still having pain he was monitored after the original injury. She declined x-ray, stating that it has never shown anything. Patient indicates that she was diagnosed with fibromyalgia and has pain chronically. I did tell her that I would not be giving her any narcotic pain medication here in the ER. She was given Vicodin at her previous visit. This will not be refilled today.    Gilda Crease, MD 10/15/12 [phone]

## 2012-10-15 NOTE — ED Notes (Signed)
Pt states she has a hx of back pain since middle school. Reinjured her back a week ago and was seen here, but still having problems.

## 2012-10-16 ENCOUNTER — Emergency Department (HOSPITAL_COMMUNITY)
Admission: EM | Admit: 2012-10-16 | Discharge: 2012-10-16 | Payer: Medicaid Other | Attending: Emergency Medicine | Admitting: Emergency Medicine

## 2012-10-16 DIAGNOSIS — IMO0002 Reserved for concepts with insufficient information to code with codable children: Secondary | ICD-10-CM | POA: Insufficient documentation

## 2012-10-16 DIAGNOSIS — Z23 Encounter for immunization: Secondary | ICD-10-CM | POA: Insufficient documentation

## 2012-10-16 DIAGNOSIS — S0990XA Unspecified injury of head, initial encounter: Secondary | ICD-10-CM | POA: Insufficient documentation

## 2012-10-16 DIAGNOSIS — Y9210 Unspecified residential institution as the place of occurrence of the external cause: Secondary | ICD-10-CM | POA: Insufficient documentation

## 2012-10-16 DIAGNOSIS — Y9389 Activity, other specified: Secondary | ICD-10-CM | POA: Insufficient documentation

## 2012-10-16 DIAGNOSIS — F3289 Other specified depressive episodes: Secondary | ICD-10-CM | POA: Insufficient documentation

## 2012-10-16 DIAGNOSIS — F329 Major depressive disorder, single episode, unspecified: Secondary | ICD-10-CM | POA: Insufficient documentation

## 2012-10-16 DIAGNOSIS — W19XXXA Unspecified fall, initial encounter: Secondary | ICD-10-CM | POA: Insufficient documentation

## 2012-10-16 DIAGNOSIS — J45909 Unspecified asthma, uncomplicated: Secondary | ICD-10-CM | POA: Insufficient documentation

## 2012-10-16 DIAGNOSIS — F1110 Opioid abuse, uncomplicated: Secondary | ICD-10-CM | POA: Insufficient documentation

## 2012-10-16 DIAGNOSIS — F17200 Nicotine dependence, unspecified, uncomplicated: Secondary | ICD-10-CM | POA: Insufficient documentation

## 2012-10-16 DIAGNOSIS — S0001XA Abrasion of scalp, initial encounter: Secondary | ICD-10-CM

## 2012-10-16 DIAGNOSIS — Z79899 Other long term (current) drug therapy: Secondary | ICD-10-CM | POA: Insufficient documentation

## 2012-10-16 DIAGNOSIS — F4310 Post-traumatic stress disorder, unspecified: Secondary | ICD-10-CM | POA: Insufficient documentation

## 2012-10-16 DIAGNOSIS — IMO0001 Reserved for inherently not codable concepts without codable children: Secondary | ICD-10-CM | POA: Insufficient documentation

## 2012-10-16 MED ORDER — ACETAMINOPHEN 325 MG PO TABS
975.0000 mg | ORAL_TABLET | Freq: Once | ORAL | Status: AC
  Administered 2012-10-16: 975 mg via ORAL
  Filled 2012-10-16: qty 3

## 2012-10-16 MED ORDER — TETANUS-DIPHTH-ACELL PERTUSSIS 5-2.5-18.5 LF-MCG/0.5 IM SUSP
0.5000 mL | Freq: Once | INTRAMUSCULAR | Status: AC
  Administered 2012-10-16: 0.5 mL via INTRAMUSCULAR
  Filled 2012-10-16: qty 0.5

## 2012-10-16 NOTE — ED Notes (Signed)
[employer] at bedside 
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
## 2012-10-16 NOTE — ED Provider Notes (Signed)
Patient fell striking her occiput approximately 3 hours prior to arrival. She has minimal pain at occiput questionable loss of consciousness on exam alert Glasgow Coma Score 15 there is a dime-sized abrasion at the occiput with surroundingscalp hematoma approximately golf ball sized neck is nontender moves all extremities Glasgow Coma Score 15 Patient refused imaging I explained the risk of subdural hematoma and bleeding to the brain and skull fracture which she understands Cervical spine is cleared viaNEXUS criteria  Doug Sou, MD 10/16/12 [phone]

## 2012-10-16 NOTE — ED Notes (Signed)
[facility]- [facility] at bedside

## 2012-10-16 NOTE — ED Notes (Addendum)
Pt c/o LAC and swelling to posterior head. Bleeding controlled. Pt A&O x 4. Eyes PERRL

## 2012-10-16 NOTE — ED Provider Notes (Signed)
Medical screening examination/treatment/procedure(s) were conducted as a shared visit with non-physician practitioner(s) and myself.  I personally evaluated the patient during the encounter  Doug Sou, MD 10/16/12 1127

## 2012-10-16 NOTE — ED Notes (Signed)
Pt ambulates independently with a steady gait

## 2012-10-19 ENCOUNTER — Encounter: Payer: Self-pay | Admitting: Family

## 2012-10-19 ENCOUNTER — Ambulatory Visit (INDEPENDENT_AMBULATORY_CARE_PROVIDER_SITE_OTHER): Payer: Medicaid Other | Admitting: Family

## 2012-10-19 ENCOUNTER — Ambulatory Visit: Payer: Self-pay | Admitting: Family

## 2012-10-19 VITALS — BP 118/76 | HR 84 | Ht 64.25 in | Wt 152.0 lb

## 2012-10-19 DIAGNOSIS — M792 Neuralgia and neuritis, unspecified: Secondary | ICD-10-CM | POA: Insufficient documentation

## 2012-10-19 DIAGNOSIS — IMO0001 Reserved for inherently not codable concepts without codable children: Secondary | ICD-10-CM

## 2012-10-19 DIAGNOSIS — G5690 Unspecified mononeuropathy of unspecified upper limb: Secondary | ICD-10-CM

## 2012-10-19 DIAGNOSIS — M797 Fibromyalgia: Secondary | ICD-10-CM

## 2012-10-19 MED ORDER — GABAPENTIN 100 MG PO CAPS: ORAL_CAPSULE | ORAL | Status: DC

## 2012-10-19 NOTE — Patient Instructions (Addendum)
Start Neurontin (also known as Gabapentin) 100mg    Take 1 at bedtime for 1 week   Then take 1 in the morning and 1 at night for 1 week   Then take 1 in the morning and 2 at night for 1 week   Then take 2 in the morning and 2 at night for 1 week. At that point, call me and let me know how you are doing. We may increase the Neurontin dose further or stay at the same dose. I will need do adjust the prescription at the pharmacy.   I will refer you to a pain clinic.   I will refer you for carpal tunnel syndrome testing. In the meantime, start wearing your wrist splints again, particularly during sleep and during activities that you know cause pain.  Investigate gentle yoga exercise. Do some form of gentle stretching exercise every day.   Continue to follow up with [facility].   Call me if you have questions or concerns.
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
## 2012-11-01 ENCOUNTER — Ambulatory Visit (INDEPENDENT_AMBULATORY_CARE_PROVIDER_SITE_OTHER): Payer: Medicaid Other | Admitting: Neurology

## 2012-11-01 ENCOUNTER — Ambulatory Visit (INDEPENDENT_AMBULATORY_CARE_PROVIDER_SITE_OTHER): Payer: Medicaid Other | Admitting: Radiology

## 2012-11-01 DIAGNOSIS — M79609 Pain in unspecified limb: Secondary | ICD-10-CM

## 2012-11-01 DIAGNOSIS — Z0289 Encounter for other administrative examinations: Secondary | ICD-10-CM

## 2012-11-01 DIAGNOSIS — R209 Unspecified disturbances of skin sensation: Secondary | ICD-10-CM

## 2012-11-01 DIAGNOSIS — IMO0001 Reserved for inherently not codable concepts without codable children: Secondary | ICD-10-CM

## 2012-11-01 DIAGNOSIS — G5690 Unspecified mononeuropathy of unspecified upper limb: Secondary | ICD-10-CM

## 2012-11-01 NOTE — Procedures (Signed)
  HISTORY:  Molly Perkins is a 22 year old patient with a history of numbness and weakness in the hands that has been present since 2008. The patient reports that she will drop things frequently from her hands. The patient reports neck discomfort and shoulder discomfort as well. The patient is being evaluated for a possible neuropathy or a cervical radiculopathy.  NERVE CONDUCTION STUDIES:  Nerve conduction studies were performed on both upper extremities. The distal motor latencies and motor amplitudes for the median and ulnar nerves were within normal limits. The F wave latencies and nerve conduction velocities for these nerves were also normal. The sensory latencies for the median, radial, and ulnar nerves were normal, with the exception that there was minimal prolongation of the right radial sensory latency.  The patient refused nerve conduction study testing of the legs.  EMG STUDIES:  EMG study was attempted on the left upper extremity. The first dorsal interosseous muscle reveals no abnormalities of insertional activity.   The patient refused any further testing.  IMPRESSION:  Nerve conduction studies done on both upper extremities were essentially normal bilaterally. There is no evidence of carpal tunnel syndrome on either side. The patient essentially refused all EMG evaluation. A cervical radiculopathy cannot be excluded from this study.  Marlan Palau MD 11/01/2012 4:18 PM  [facility] [address] [place], [place] [address]  Phone [phone] Fax [phone]

## 2012-11-02 ENCOUNTER — Telehealth: Payer: Self-pay | Admitting: Family

## 2012-11-03 NOTE — Telephone Encounter (Signed)
[place] left a message yesterday asking about the referral to pain clinic. I called and left her a message, asking her to call back next week. Marcelino Duster, would you follow up with her next week as well? Thanks, Inetta Fermo

## 2012-11-06 ENCOUNTER — Encounter: Payer: Self-pay | Admitting: *Deleted

## 2012-11-07 NOTE — Telephone Encounter (Signed)
I spoke with patient's mom and at her request I have faxed order, notes and demos to [facility]. Thanks, Belenda Cruise.

## 2012-12-21 ENCOUNTER — Emergency Department (HOSPITAL_BASED_OUTPATIENT_CLINIC_OR_DEPARTMENT_OTHER)
Admission: EM | Admit: 2012-12-21 | Discharge: 2012-12-21 | Disposition: A | Discharge status: Home / Self Care | Payer: Medicaid Other | Attending: Emergency Medicine | Admitting: Emergency Medicine

## 2012-12-21 ENCOUNTER — Encounter (HOSPITAL_BASED_OUTPATIENT_CLINIC_OR_DEPARTMENT_OTHER): Payer: Self-pay | Admitting: *Deleted

## 2012-12-21 DIAGNOSIS — F1111 Opioid abuse, in remission: Secondary | ICD-10-CM | POA: Insufficient documentation

## 2012-12-21 DIAGNOSIS — F4310 Post-traumatic stress disorder, unspecified: Secondary | ICD-10-CM | POA: Insufficient documentation

## 2012-12-21 DIAGNOSIS — G8929 Other chronic pain: Secondary | ICD-10-CM | POA: Insufficient documentation

## 2012-12-21 DIAGNOSIS — J45909 Unspecified asthma, uncomplicated: Secondary | ICD-10-CM | POA: Insufficient documentation

## 2012-12-21 DIAGNOSIS — IMO0001 Reserved for inherently not codable concepts without codable children: Secondary | ICD-10-CM | POA: Insufficient documentation

## 2012-12-21 DIAGNOSIS — Z79899 Other long term (current) drug therapy: Secondary | ICD-10-CM | POA: Insufficient documentation

## 2012-12-21 DIAGNOSIS — M545 Low back pain, unspecified: Secondary | ICD-10-CM | POA: Insufficient documentation

## 2012-12-21 DIAGNOSIS — Z3202 Encounter for pregnancy test, result negative: Secondary | ICD-10-CM | POA: Insufficient documentation

## 2012-12-21 DIAGNOSIS — Z8659 Personal history of other mental and behavioral disorders: Secondary | ICD-10-CM | POA: Insufficient documentation

## 2012-12-21 DIAGNOSIS — F17200 Nicotine dependence, unspecified, uncomplicated: Secondary | ICD-10-CM | POA: Insufficient documentation

## 2012-12-21 LAB — URINALYSIS, ROUTINE W REFLEX MICROSCOPIC
Glucose, UA: NEGATIVE mg/dL
Hgb urine dipstick: NEGATIVE
Specific Gravity, Urine: 1.014 (ref 1.005–1.030)

## 2012-12-21 LAB — PREGNANCY, URINE: Preg Test, Ur: NEGATIVE

## 2012-12-21 MED ORDER — MELOXICAM 7.5 MG PO TABS: 15.0000 mg | ORAL_TABLET | Freq: Every day | ORAL | Status: DC

## 2012-12-21 MED ORDER — METHOCARBAMOL 500 MG PO TABS
500.0000 mg | ORAL_TABLET | Freq: Once | ORAL | Status: AC
  Administered 2012-12-21: 500 mg via ORAL
  Filled 2012-12-21: qty 1

## 2012-12-21 NOTE — ED Provider Notes (Signed)
Medical screening examination/treatment/procedure(s) were performed by non-physician practitioner and as supervising physician I was immediately available for consultation/collaboration.  Doug Sou, MD 12/21/12 [phone]

## 2012-12-21 NOTE — ED Provider Notes (Signed)
History     CSN: [id number]  Arrival date & time 12/21/12  1140   First MD Initiated Contact with Patient 12/21/12 1226      Chief Complaint  Patient presents with  . Back Pain    (Consider location/radiation/quality/duration/timing/severity/associated sxs/prior treatment) HPI Comments: 22 y.o. Female with PMHx of fibromyalgia, opiate abuse, depression, and PTSD presents today complaining of chronic lower back pain (since 2008). Pt was dx with fibromyalgia in 2012. Taking neurontin prescribed by PCP (Dr. Tyron Russell) which has not helped. Has an appt with pain management clinic on Sat.   Pt states she has felt this pain before. Describes pain as sharp. Radiates down the back of both legs. Worse with movement. Better with rest. This is not a new problem. She believes it to be related to working in the yard yesterday. Denies dysuria, hematuria, nausea, vomiting, no loss of bowel/bladder control, denies saddle paresthesia.   Patient is a 23 y.o. female presenting with back pain.  Back Pain Associated symptoms: no chest pain, no dysuria, no fever, no headaches, no numbness and no weakness     Past Medical History  Diagnosis Date  . Asthma   . Depressed   . Fibromyalgia   . PTSD (post-traumatic stress disorder)   . Opiate abuse, continuous     History reviewed. No pertinent past surgical history.  No family history on file.  History  Substance Use Topics  . Smoking status: Current Every Day Smoker -- 0.25 packs/day for 3 years    Types: Cigarettes  . Smokeless tobacco: Current User  . Alcohol Use: 0.6 oz/week    1 Cans of beer per week     Comment: occasional liquor and beer    OB History   Grav Para Term Preterm Abortions TAB SAB Ect Mult Living                  Review of Systems  Constitutional: Negative for fever and diaphoresis.  HENT: Negative for neck pain and neck stiffness.   Eyes: Negative for visual disturbance.  Respiratory: Negative for apnea, chest  tightness and shortness of breath.   Cardiovascular: Negative for chest pain and palpitations.  Gastrointestinal: Negative for nausea, vomiting, diarrhea and constipation.  Genitourinary: Negative for dysuria.  Musculoskeletal: Positive for back pain. Negative for gait problem.       Bilateral lumbar paraspinous muscles  Skin: Negative for rash.  Neurological: Negative for dizziness, weakness, light-headedness, numbness and headaches.    Allergies  Bee venom  Home Medications   Current Outpatient Rx  Name  Route  Sig  Dispense  Refill  . diclofenac (VOLTAREN) 75 MG EC tablet   Oral   Take 1 tablet (75 mg total) by mouth 2 (two) times daily.   20 tablet   0   . EPINEPHrine (EPIPEN JR) 0.15 MG/0.3ML injection   Intramuscular   Inject 0.15 mg into the muscle as needed for anaphylaxis.         Marland Kitchen FLUoxetine (PROZAC) 20 MG capsule   Oral   Take 20 mg by mouth daily.         Marland Kitchen gabapentin (NEURONTIN) 100 MG capsule      1 at bedtime for 1 week, then 1 in the morning and 1 at night for 1 week, then 1 in the morning and 2 at night for 1 week, then 2 BID   70 capsule   0   . HYDROcodone-acetaminophen (NORCO/VICODIN) 5-325 MG per tablet   Oral  Take 2 tablets by mouth every 4 (four) hours as needed for pain.   10 tablet   0   . methocarbamol (ROBAXIN) 500 MG tablet   Oral   Take 1 tablet (500 mg total) by mouth 2 (two) times daily.   14 tablet   0     BP 126/71  Pulse 86  Temp(Src) 98 F (36.7 C) (Oral)  Resp 18  Wt 152 lb (68.947 kg)  BMI 25.89 kg/m2  SpO2 98%  Physical Exam  Nursing note and vitals reviewed. Constitutional: She is oriented to person, place, and time. She appears well-developed and well-nourished. No distress.  HENT:  Head: Normocephalic and atraumatic.  Eyes: Conjunctivae and EOM are normal.  Neck: Normal range of motion. Neck supple.  No meningeal signs  Cardiovascular: Normal rate, regular rhythm, normal heart sounds and intact distal  pulses.  Exam reveals no gallop and no friction rub.   No murmur heard. Pulmonary/Chest: Effort normal and breath sounds normal. No respiratory distress. She has no wheezes. She has no rales. She exhibits no tenderness.  Abdominal: Soft. Bowel sounds are normal. She exhibits no distension. There is no tenderness. There is no rebound and no guarding.  Musculoskeletal: Normal range of motion. She exhibits no edema and no tenderness.  FROM to upper and lower extremities No step-offs noted on C-spine No tenderness to palpation of the spinous processes of the C-spine, T-spine or L-spine Full range of motion of C-spine, T-spine or L-spine Mild tenderness to palpation of the lumbar paraspinous muscles   Neurological: She is alert and oriented to person, place, and time. No cranial nerve deficit.  Speech is clear and goal oriented, follows commands Sensation normal to light touch Moves extremities without ataxia, coordination intact Normal gait and balance Normal strength in upper and lower extremities bilaterally including dorsiflexion and plantar flexion, strong and equal grip strength   Skin: Skin is warm and dry. She is not diaphoretic. No erythema.    ED Course  Procedures (including critical care time)  Labs Reviewed  URINALYSIS, ROUTINE W REFLEX MICROSCOPIC  PREGNANCY, URINE   No results found.   1. Chronic lower back pain       MDM  Patient with chronic back pain.  No neurological deficits and normal neuro exam.  Patient can walk but states is painful.  No loss of bowel or bladder control.  No concern for cauda equina.  No fever, night sweats, weight loss, h/o cancer, IVDU.  Discussed hospital chronic pain treatment policy with pt and encouraged her to keep her appt with pain clinic on Sat. RICE protocol and anti-inflammatories indicated and discussed with patient. Pt case seen by and discussed with Dr. Ethelda Chick who is in agreement with plan.    Glade Nurse, PA-C 12/21/12  1554

## 2012-12-21 NOTE — ED Notes (Signed)
Lower back pain since working in the yard yesterday. Denies urinary complaints. States she knows her back pain is muscular.

## 2013-01-01 ENCOUNTER — Other Ambulatory Visit: Payer: Self-pay

## 2013-01-01 DIAGNOSIS — M797 Fibromyalgia: Secondary | ICD-10-CM

## 2013-01-01 DIAGNOSIS — M545 Low back pain: Secondary | ICD-10-CM

## 2013-01-01 MED ORDER — GABAPENTIN 100 MG PO CAPS: ORAL_CAPSULE | ORAL | Status: DC

## 2013-01-09 ENCOUNTER — Ambulatory Visit: Payer: Medicaid Other | Attending: Anesthesiology | Admitting: Rehabilitation

## 2013-01-16 ENCOUNTER — Ambulatory Visit: Payer: Medicaid Other | Attending: Anesthesiology | Admitting: Physical Therapy

## 2014-09-22 ENCOUNTER — Emergency Department (HOSPITAL_BASED_OUTPATIENT_CLINIC_OR_DEPARTMENT_OTHER)
Admission: EM | Admit: 2014-09-22 | Discharge: 2014-09-22 | Disposition: A | Discharge status: Home / Self Care | Payer: Medicaid Other | Attending: Emergency Medicine | Admitting: Emergency Medicine

## 2014-09-22 ENCOUNTER — Encounter (HOSPITAL_BASED_OUTPATIENT_CLINIC_OR_DEPARTMENT_OTHER): Payer: Self-pay | Admitting: *Deleted

## 2014-09-22 DIAGNOSIS — F4310 Post-traumatic stress disorder, unspecified: Secondary | ICD-10-CM | POA: Insufficient documentation

## 2014-09-22 DIAGNOSIS — Z79899 Other long term (current) drug therapy: Secondary | ICD-10-CM | POA: Insufficient documentation

## 2014-09-22 DIAGNOSIS — Z720 Tobacco use: Secondary | ICD-10-CM | POA: Insufficient documentation

## 2014-09-22 DIAGNOSIS — J45909 Unspecified asthma, uncomplicated: Secondary | ICD-10-CM | POA: Insufficient documentation

## 2014-09-22 DIAGNOSIS — G5601 Carpal tunnel syndrome, right upper limb: Secondary | ICD-10-CM | POA: Insufficient documentation

## 2014-09-22 DIAGNOSIS — M797 Fibromyalgia: Secondary | ICD-10-CM | POA: Insufficient documentation

## 2014-09-22 DIAGNOSIS — Z791 Long term (current) use of non-steroidal anti-inflammatories (NSAID): Secondary | ICD-10-CM | POA: Insufficient documentation

## 2014-09-22 DIAGNOSIS — F329 Major depressive disorder, single episode, unspecified: Secondary | ICD-10-CM | POA: Insufficient documentation

## 2014-09-22 MED ORDER — IBUPROFEN 800 MG PO TABS
800.0000 mg | ORAL_TABLET | Freq: Once | ORAL | Status: AC
  Administered 2014-09-22: 800 mg via ORAL
  Filled 2014-09-22: qty 1

## 2014-09-22 MED ORDER — PREDNISONE 50 MG PO TABS
60.0000 mg | ORAL_TABLET | Freq: Once | ORAL | Status: AC
  Administered 2014-09-22: 60 mg via ORAL
  Filled 2014-09-22 (×2): qty 1

## 2014-09-22 MED ORDER — MELOXICAM 15 MG PO TABS
15.0000 mg | ORAL_TABLET | Freq: Every day | ORAL | Status: DC
Start: 2014-09-22 — End: 2015-03-09

## 2014-09-22 MED ORDER — PREDNISONE 20 MG PO TABS: ORAL_TABLET | ORAL | Status: DC

## 2014-09-22 NOTE — ED Notes (Signed)
C/o bilateral hand and wrist pain, sometimes shooting up to shoulder. Reports h/o carpal tunnel, seen for the same in the distant past (dx'd in elementary school), wearing bilateral wrist braces, had 2 ibuprofen at 2000, works with hands in both work and school, also h/o fibromyalgia.

## 2014-09-22 NOTE — ED Provider Notes (Signed)
CSN: [id number]     Arrival date & time 09/22/14  0111 History   First MD Initiated Contact with Patient 09/22/14 0413     Chief Complaint  Patient presents with  . Wrist Pain     (Consider location/radiation/quality/duration/timing/severity/associated sxs/prior Treatment) Patient is a 24 y.o. female presenting with wrist pain. The history is provided by the patient.  Wrist Pain This is a chronic problem. The current episode started more than 1 week ago. The problem occurs constantly. The problem has not changed since onset.Pertinent negatives include no chest pain, no abdominal pain, no headaches and no shortness of breath. Nothing aggravates the symptoms. Nothing relieves the symptoms. She has tried nothing for the symptoms. The treatment provided no relief.    Past Medical History  Diagnosis Date  . Asthma   . Depressed   . Fibromyalgia   . PTSD (post-traumatic stress disorder)   . Opiate abuse, continuous    History reviewed. No pertinent past surgical history. History reviewed. No pertinent family history. History  Substance Use Topics  . Smoking status: Current Every Day Smoker -- 0.25 packs/day for 3 years    Types: Cigarettes  . Smokeless tobacco: Current User  . Alcohol Use: No   OB History    No data available     Review of Systems  Respiratory: Negative for shortness of breath.   Cardiovascular: Negative for chest pain.  Gastrointestinal: Negative for abdominal pain.  Neurological: Negative for headaches.  All other systems reviewed and are negative.     Allergies  Bee venom  Home Medications   Prior to Admission medications   Medication Sig Start Date End Date Taking? Authorizing Provider  diclofenac (VOLTAREN) 75 MG EC tablet Take 1 tablet (75 mg total) by mouth 2 (two) times daily. 10/15/12   Gilda Crease, MD  EPINEPHrine (EPIPEN JR) 0.15 MG/0.3ML injection Inject 0.15 mg into the muscle as needed for anaphylaxis.    Historical Provider, MD   FLUoxetine (PROZAC) 20 MG capsule Take 20 mg by mouth daily.    Historical Provider, MD  gabapentin (NEURONTIN) 100 MG capsule Take 2 tabs by mouth twice daily 01/01/13   Elveria Rising, NP  HYDROcodone-acetaminophen (NORCO/VICODIN) 5-325 MG per tablet Take 2 tablets by mouth every 4 (four) hours as needed for pain. 10/07/12   Garlon Hatchet, PA-C  meloxicam (MOBIC) 15 MG tablet Take 1 tablet (15 mg total) by mouth daily. 09/22/14   Wonder Donaway K Jermika Olden-Rasch, MD  meloxicam (MOBIC) 7.5 MG tablet Take 2 tablets (15 mg total) by mouth daily. 12/21/12   Glade Nurse, PA-C  methocarbamol (ROBAXIN) 500 MG tablet Take 1 tablet (500 mg total) by mouth 2 (two) times daily. 10/07/12   Garlon Hatchet, PA-C  predniSONE (DELTASONE) 20 MG tablet 3 tabs po day one, then 2 po daily x 4 days 09/22/14   Ariah Mower K Vanissa Strength-Rasch, MD   BP 116/74 mmHg  Pulse 76  Temp(Src) 98.4 F (36.9 C)  Resp 16  Ht  (1.651 m)  Wt 150 lb (68.04 kg)  BMI 24.96 kg/m2  SpO2 99%  LMP 08/24/2014 (Approximate) Physical Exam  Constitutional: She is oriented to person, place, and time. She appears well-developed and well-nourished. No distress.  HENT:  Head: Normocephalic and atraumatic.  Mouth/Throat: Oropharynx is clear and moist.  Eyes: Conjunctivae are normal. Pupils are equal, round, and reactive to light.  Neck: Normal range of motion. Neck supple.  Cardiovascular: Normal rate, regular rhythm and intact distal pulses.  Pulmonary/Chest: Effort normal and breath sounds normal. No respiratory distress. She has no wheezes. She has no rales.  Abdominal: Soft. Bowel sounds are normal. There is no tenderness. There is no rebound and no guarding.  Musculoskeletal: Normal range of motion.       Right wrist: Normal.       Left wrist: Normal.  Positive tinel's sign  Neurological: She is alert and oriented to person, place, and time.  Skin: Skin is warm and dry.    ED Course  Procedures (including critical care time) Labs  Review Labs Reviewed - No data to display  Imaging Review No results found.   EKG Interpretation None      MDM   Final diagnoses:  Carpal tunnel syndrome of right wrist    Here with other family members who are being seen.  Sound asleep in the room upon entrance and during exam.    Right wrist splint to match left follow up with orthopedics for ongoing care.      Jasmine AweApril K Aimar Borghi-Rasch, MD 09/22/14 [phone]

## 2014-09-22 NOTE — ED Notes (Signed)
C/o history of bilateral wrist hurting. States most recent pain started 2 weeks ago. Describes pain as "stiff" bilateral and pain "shoots" up both arms bilateral. Pt has braces on bilateral but states does not help pain. States hx of carpal tunnel and denies any recent injury.

## 2014-09-22 NOTE — Discharge Instructions (Signed)
Carpal Tunnel Syndrome °Carpal tunnel syndrome is a disorder of the nervous system in the wrist that causes pain, hand weakness, and/or loss of feeling. Carpal tunnel syndrome is caused by the compression, stretching, or irritation of the median nerve at the wrist joint. Athletes who experience carpal tunnel syndrome may notice a decrease in their performance to the condition, especially for sports that require strong hand or wrist action.  °SYMPTOMS  °· Tingling, numbness, or burning pain in the hand or fingers. °· Inability to sleep due to pain in the hand. °· Sharp pains that shoot from the wrist up the arm or to the fingers, especially at night. °· Morning stiffness or cramping of the hand. °· Thumb weakness, resulting in difficulty holding objects or making a fist. °· Shiny, dry skin on the hand. °· Reduced performance in any sport requiring a strong grip. °CAUSES  °· Median nerve damage at the wrist is caused by pressure due to swelling, inflammation, or scarred tissue. °· Sources of pressure include: °¨ Repetitive gripping or squeezing that causes inflammation of the tendon sheaths. °¨ Scarring or shortening of the ligament that covers the median nerve. °¨ Traumatic injury to the wrist or forearm such as fracture, sprain, or dislocation. °¨ Prolonged hyperextension (wrist bent backward) or hyperflexion (wrist bent downward) of the wrist. °RISK INCREASES WITH: °· Diabetes mellitus. °· Menopause or amenorrhea. °· Rheumatoid arthritis. °· Raynaud disease. °· Pregnancy. °· Gout. °· Kidney disease. °· Ganglion cyst. °· Repetitive hand or wrist action. °· Hypothyroidism (underactive thyroid gland). °· Repetitive jolting or shaking of the hands or wrist. °· Prolonged forceful weight-bearing on the hands. °PREVENTION °· Bracing the hand and wrist straight during activities that involve repetitive grasping. °· For activities that require prolonged extension of the wrist (bending towards the top of the forearm)  periodically change the position of your wrists. °· Learn and use proper technique in activities that result in the wrist position in neutral to slight extension. °· Avoid bending the wrist into full extension or flexion (up or down). °· Keep the wrist in a straight (neutral) position. To keep the wrist in this position, wear a splint. °· Avoid repetitive hand and wrist motions. °· When possible avoid prolonged grasping of items (steering wheel of a car, a pen, a vacuum cleaner, or a rake). °· Loosen your grip for activities that require prolonged grasping of items. °· Place keyboards and writing surfaces at the correct height as to decrease strain on the wrist and hand. °· Alternate work tasks to avoid prolonged wrist flexion. °· Avoid pinching activities (needlework and writing) as they may irritate your carpal tunnel syndrome. °· If these activities are necessary, complete them for shorter periods of time. °· When writing, use a felt tip or rollerball pen and/or build up the grip on a pen to decrease the forces required for writing. °PROGNOSIS  °Carpal tunnel syndrome is usually curable with appropriate conservative treatment and sometimes resolves spontaneously. For some cases, surgery is necessary, especially if muscle wasting or nerve changes have developed.  °RELATED COMPLICATIONS  °· Permanent numbness and a weak thumb or fingers in the affected hand. °· Permanent paralysis of a portion of the hand and fingers. °TREATMENT  °Treatment initially consists of stopping activities that aggravate the symptoms as well as medication and ice to reduce inflammation. A wrist splint is often recommended for wear during activities of repetitive motion as well as at night. It is also important to learn and use proper technique when   performing activities that typically cause pain. On occasion, a corticosteroid injection may be given. °If symptoms persist despite conservative treatment, surgery may be an option. Surgical  techniques free the pinched or compressed nerve. Carpal tunnel surgery is usually performed on an outpatient basis, meaning you go home the same day as surgery. These procedures provide almost complete relief of all symptoms in 95% of patients. Expect at least 2 weeks for healing after surgery. For cases that are the result of repeated jolting or shaking of the hand or wrist or prolonged hyperextension, surgery is not usually recommended because stretching of the median nerve, not compression, is usually the cause of carpal tunnel syndrome in these cases. °MEDICATION  °· If pain medication is necessary, nonsteroidal anti-inflammatory medications, such as aspirin and ibuprofen, or other minor pain relievers, such as acetaminophen, are often recommended. °· Do not take pain medication for 7 days before surgery. °· Prescription pain relievers are usually only prescribed after surgery. Use only as directed and only as much as you need. °· Corticosteroid injections may be given to reduce inflammation. However, they are not always recommended. °· Vitamin B6 (pyridoxine) may reduce symptoms; use only if prescribed for your disorder. °SEEK MEDICAL CARE IF:  °· Symptoms get worse or do not improve in 2 weeks despite treatment. °· You also have a current or recent history of neck or shoulder injury that has resulted in pain or tingling elsewhere in your arm. °Document Released: 07/05/2005 Document Revised: 11/19/2013 Document Reviewed: 10/17/2008 °[employer]® Patient Information ©2015 [employer]. This information is not intended to replace advice given to you by your health care provider. Make sure you discuss any questions you have with your health care provider. ° °

## 2014-12-03 ENCOUNTER — Encounter (HOSPITAL_BASED_OUTPATIENT_CLINIC_OR_DEPARTMENT_OTHER): Payer: Self-pay | Admitting: *Deleted

## 2014-12-03 ENCOUNTER — Emergency Department (HOSPITAL_BASED_OUTPATIENT_CLINIC_OR_DEPARTMENT_OTHER)
Admission: EM | Admit: 2014-12-03 | Discharge: 2014-12-04 | Disposition: A | Discharge status: Home / Self Care | Payer: BLUE CROSS/BLUE SHIELD | Attending: Emergency Medicine | Admitting: Emergency Medicine

## 2014-12-03 DIAGNOSIS — Z791 Long term (current) use of non-steroidal anti-inflammatories (NSAID): Secondary | ICD-10-CM | POA: Insufficient documentation

## 2014-12-03 DIAGNOSIS — Y990 Civilian activity done for income or pay: Secondary | ICD-10-CM | POA: Insufficient documentation

## 2014-12-03 DIAGNOSIS — F4310 Post-traumatic stress disorder, unspecified: Secondary | ICD-10-CM | POA: Diagnosis not present

## 2014-12-03 DIAGNOSIS — Z79899 Other long term (current) drug therapy: Secondary | ICD-10-CM | POA: Insufficient documentation

## 2014-12-03 DIAGNOSIS — Z23 Encounter for immunization: Secondary | ICD-10-CM | POA: Insufficient documentation

## 2014-12-03 DIAGNOSIS — S61211A Laceration without foreign body of left index finger without damage to nail, initial encounter: Secondary | ICD-10-CM | POA: Diagnosis present

## 2014-12-03 DIAGNOSIS — F329 Major depressive disorder, single episode, unspecified: Secondary | ICD-10-CM | POA: Diagnosis not present

## 2014-12-03 DIAGNOSIS — J45909 Unspecified asthma, uncomplicated: Secondary | ICD-10-CM | POA: Insufficient documentation

## 2014-12-03 DIAGNOSIS — Z720 Tobacco use: Secondary | ICD-10-CM | POA: Diagnosis not present

## 2014-12-03 DIAGNOSIS — Y288XXA Contact with other sharp object, undetermined intent, initial encounter: Secondary | ICD-10-CM | POA: Diagnosis not present

## 2014-12-03 DIAGNOSIS — Y9289 Other specified places as the place of occurrence of the external cause: Secondary | ICD-10-CM | POA: Diagnosis not present

## 2014-12-03 DIAGNOSIS — M797 Fibromyalgia: Secondary | ICD-10-CM | POA: Insufficient documentation

## 2014-12-03 DIAGNOSIS — Y9389 Activity, other specified: Secondary | ICD-10-CM | POA: Insufficient documentation

## 2014-12-03 NOTE — ED Provider Notes (Signed)
CSN: [id number]     Arrival date & time 12/03/14  2235 History  This chart was scribed for Molly LibraJohn Shavona Gunderman, MD by Phillis HaggisGabriella Gaje, ED Scribe. This patient was seen in room MH03/MH03 and patient care was started at 11:58 PM.   Chief Complaint  Patient presents with  . Laceration   The history is provided by the patient. No language interpreter was used.    HPI Comments: Molly Perkins is a 24 y.o. female who presents to the Emergency Department complaining of a left index finger laceration onset 3 hours ago. She states that she was at work when she hit her finger on something metal. Her friend states that they bandaged the wound but it continued to bleed through Band-Aid, though it is now hemostatic. Pain is minimal. 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 She reports her last tdap was over 5 years ago.  Past Medical History  Diagnosis Date  . Asthma   . Depressed   . Fibromyalgia   . PTSD (post-traumatic stress disorder)   . Opiate abuse, continuous    History reviewed. No pertinent past surgical history. History reviewed. No pertinent family history. History  Substance Use Topics  . Smoking status: Current Every Day Smoker -- 0.50 packs/day for 3 years    Types: Cigarettes  . Smokeless tobacco: Current User  . Alcohol Use: No   OB History    No data available     Review of Systems A complete 10 system review of systems was obtained and all systems are negative except as noted in the HPI and PMH.   Allergies  Bee venom  Home Medications   Prior to Admission medications   Medication Sig Start Date End Date Taking? Authorizing Provider  diclofenac (VOLTAREN) 75 MG EC tablet Take 1 tablet (75 mg total) by mouth 2 (two) times daily. 10/15/12   Gilda Creasehristopher J Pollina, MD  EPINEPHrine (EPIPEN JR) 0.15 MG/0.3ML injection Inject 0.15 mg into the muscle as needed for anaphylaxis.    Historical Provider, MD  FLUoxetine (PROZAC) 20 MG capsule Take 20 mg by mouth  daily.    Historical Provider, MD  gabapentin (NEURONTIN) 100 MG capsule Take 2 tabs by mouth twice daily 01/01/13   Elveria Risingina Goodpasture, NP  HYDROcodone-acetaminophen (NORCO/VICODIN) 5-325 MG per tablet Take 2 tablets by mouth every 4 (four) hours as needed for pain. 10/07/12   Garlon HatchetLisa M Sanders, PA-C  meloxicam (MOBIC) 15 MG tablet Take 1 tablet (15 mg total) by mouth daily. 09/22/14   April Palumbo, MD  meloxicam (MOBIC) 7.5 MG tablet Take 2 tablets (15 mg total) by mouth daily. 12/21/12   Lowell BoutonBarbara A Beck, PA-C  methocarbamol (ROBAXIN) 500 MG tablet Take 1 tablet (500 mg total) by mouth 2 (two) times daily. 10/07/12   Garlon HatchetLisa M Sanders, PA-C  predniSONE (DELTASONE) 20 MG tablet 3 tabs po day one, then 2 po daily x 4 days 09/22/14   April Palumbo, MD   BP 130/81 mmHg  Pulse 52  Temp(Src) 98.8 F (37.1 C) (Oral)  Resp 16  Ht 5\' 5"  (1.651 m)  Wt 150 lb (68.04 kg)  BMI 24.96 kg/m2  SpO2 100%  LMP 11/12/2014  Physical Exam  Nursing note and vitals reviewed. General: Well-developed, well-nourished female in no acute distress; appearance consistent with age of record HENT: normocephalic; atraumatic Eyes: Normal appearance Neck: supple Heart: regular rate and rhythm Lungs: Normal respiratory effort and excursion Abdomen: soft; nondistended Extremities: No deformity; full range of  motion; pulses normal Neurologic: Awake, alert and oriented; motor function intact in all extremities and symmetric; no facial droop Skin: Warm and dry; superficial laceration over the dorsal left second finger PIP joint Psychiatric: Normal mood and affect  ED Course  Procedures (including critical care time)  LACERATION REPAIR Performed by: Labresha Mellor L Authorized by: Hanley SeamenMOLPUS,Neila Teem L Consent: Verbal consent obtained. Risks and benefits: risks, benefits and alternatives were discussed Consent given by: patient Patient identity confirmed: provided demographic data Prepped and Draped in normal sterile fashion Wound  explored  Laceration Location: Left index finger  Laceration Length: 0.6 cm  No Foreign Bodies seen or palpated  Anesthesia: local infiltration  Local anesthetic: lidocaine 2 % without epinephrine  Anesthetic total: 0.5 ml  Irrigation method: syringe Amount of cleaning: standard  Skin closure: 5-0 Prolene   Number of sutures: 2   Technique: Simple and are up-to-date   Patient tolerance: Patient tolerated the procedure well with no immediate complications.   MDM  I personally performed the services described in this documentation, which was scribed in my presence. The recorded information has been reviewed and is accurate.   Molly LibraJohn Alexsa Flaum, MD 12/04/14 [phone]

## 2014-12-03 NOTE — ED Notes (Signed)
Pt c/o laceration to left index finger by metal  X 2 hrs ago

## 2014-12-04 MED ORDER — LIDOCAINE HCL (PF) 1 % IJ SOLN
INTRAMUSCULAR | Status: AC
  Administered 2014-12-04: 2 mL
  Filled 2014-12-04: qty 5

## 2014-12-04 MED ORDER — LIDOCAINE HCL (PF) 1 % IJ SOLN
2.0000 mL | Freq: Once | INTRAMUSCULAR | Status: AC
  Administered 2014-12-04: 2 mL

## 2014-12-04 MED ORDER — TETANUS-DIPHTH-ACELL PERTUSSIS 5-2.5-18.5 LF-MCG/0.5 IM SUSP
0.5000 mL | Freq: Once | INTRAMUSCULAR | Status: AC
  Administered 2014-12-04: 0.5 mL via INTRAMUSCULAR
  Filled 2014-12-04: qty 0.5

## 2014-12-04 NOTE — ED Notes (Signed)
Wound cleaned with shur clens. Bacitracin applied and wound covered with gauze and coband.

## 2015-03-08 ENCOUNTER — Inpatient Hospital Stay (HOSPITAL_COMMUNITY)
Admission: AD | Admit: 2015-03-08 | Discharge: 2015-03-09 | Disposition: A | Discharge status: Home / Self Care | Payer: Self-pay | Source: Ambulatory Visit | Attending: Obstetrics & Gynecology | Admitting: Obstetrics & Gynecology

## 2015-03-08 ENCOUNTER — Encounter (HOSPITAL_COMMUNITY): Payer: Self-pay | Admitting: *Deleted

## 2015-03-08 DIAGNOSIS — R109 Unspecified abdominal pain: Secondary | ICD-10-CM

## 2015-03-08 DIAGNOSIS — R1030 Lower abdominal pain, unspecified: Secondary | ICD-10-CM | POA: Insufficient documentation

## 2015-03-08 DIAGNOSIS — Z87891 Personal history of nicotine dependence: Secondary | ICD-10-CM | POA: Insufficient documentation

## 2015-03-08 DIAGNOSIS — N926 Irregular menstruation, unspecified: Secondary | ICD-10-CM

## 2015-03-08 DIAGNOSIS — R112 Nausea with vomiting, unspecified: Secondary | ICD-10-CM | POA: Insufficient documentation

## 2015-03-08 DIAGNOSIS — R1013 Epigastric pain: Secondary | ICD-10-CM | POA: Insufficient documentation

## 2015-03-08 LAB — COMPREHENSIVE METABOLIC PANEL
ALT: 10 U/L — AB (ref 14–54)
AST: 20 U/L (ref 15–41)
Albumin: 3.8 g/dL (ref 3.5–5.0)
Alkaline Phosphatase: 48 U/L (ref 38–126)
Anion gap: 9 (ref 5–15)
BILIRUBIN TOTAL: 0.5 mg/dL (ref 0.3–1.2)
BUN: 11 mg/dL (ref 6–20)
CO2: 27 mmol/L (ref 22–32)
CREATININE: 0.92 mg/dL (ref 0.44–1.00)
Calcium: 9 mg/dL (ref 8.9–10.3)
Chloride: 104 mmol/L (ref 101–111)
GFR calc Af Amer: >60 mL/min (ref >60)
Glucose, Bld: 89 mg/dL (ref 65–99)
Potassium: 3.4 mmol/L — ABNORMAL LOW (ref 3.5–5.1)
Sodium: 140 mmol/L (ref 135–145)
TOTAL PROTEIN: 6.7 g/dL (ref 6.5–8.1)

## 2015-03-08 LAB — URINALYSIS, ROUTINE W REFLEX MICROSCOPIC
Bilirubin Urine: NEGATIVE
Glucose, UA: NEGATIVE mg/dL
Ketones, ur: 15 mg/dL — AB
LEUKOCYTES UA: NEGATIVE
NITRITE: NEGATIVE
PH: 6.5 (ref 5.0–8.0)
Protein, ur: NEGATIVE mg/dL
SPECIFIC GRAVITY, URINE: 1.02 (ref 1.005–1.030)
Urobilinogen, UA: 1 mg/dL (ref 0.0–1.0)

## 2015-03-08 LAB — URINE MICROSCOPIC-ADD ON

## 2015-03-08 LAB — CBC
HEMATOCRIT: 31.8 % — AB (ref 36.0–46.0)
HEMOGLOBIN: 10 g/dL — AB (ref 12.0–15.0)
MCH: 23.8 pg — ABNORMAL LOW (ref 26.0–34.0)
MCHC: 31.4 g/dL (ref 30.0–36.0)
MCV: 75.7 fL — AB (ref 78.0–100.0)
Platelets: 367 10*3/uL (ref 150–400)
RBC: 4.2 MIL/uL (ref 3.87–5.11)
RDW: 17.8 % — ABNORMAL HIGH (ref 11.5–15.5)
WBC: 7.3 10*3/uL (ref 4.0–10.5)

## 2015-03-08 LAB — POCT PREGNANCY, URINE: Preg Test, Ur: NEGATIVE

## 2015-03-08 MED ORDER — PROMETHAZINE HCL 25 MG PO TABS: 12.5000 mg | ORAL_TABLET | Freq: Four times a day (QID) | ORAL | Status: DC | PRN

## 2015-03-08 MED ORDER — ONDANSETRON 8 MG PO TBDP
8.0000 mg | ORAL_TABLET | ORAL | Status: AC
  Administered 2015-03-08: 8 mg via ORAL
  Filled 2015-03-08: qty 1

## 2015-03-08 MED ORDER — GI COCKTAIL ~~LOC~~
30.0000 mL | Freq: Once | ORAL | Status: AC
  Administered 2015-03-08: 30 mL via ORAL
  Filled 2015-03-08: qty 30

## 2015-03-08 MED ORDER — RANITIDINE HCL 150 MG PO CAPS: 150.0000 mg | ORAL_CAPSULE | Freq: Every day | ORAL | Status: DC

## 2015-03-08 NOTE — MAU Provider Note (Signed)
Chief Complaint: Nausea and Emesis   First Provider Initiated Contact with Patient 03/08/15 2150      SUBJECTIVE HPI: Molly Perkins is a 24 y.o. [id number] who presents to maternity admissions reporting upper abdominal pain, n/v associated with menses.  She left work at [employer] today because she threw up and could not keep even water down.  She reports she has these symptoms every month with her period. The abdominal pain in her mid upper abdomen starts first, followed by nausea and vomiting. She reports mild lower abdominal cramping tolerated well without medications.  She reports she never has these symptoms unless she is having her period.  Patient's last menstrual period was 03/03/2015.  She is having light bleeding at the end of her period today.  She denies indication for STI testing.  She denies vaginal itching/burning, urinary symptoms, h/a, dizziness, or fever/chills.     Emesis  This is a recurrent problem. The current episode started today. The problem occurs less than 2 times per day. The problem has been unchanged. The emesis has an appearance of stomach contents. There has been no fever. Associated symptoms include abdominal pain. Pertinent negatives include no chest pain, chills, diarrhea, dizziness, fever or headaches. She has tried nothing for the symptoms.  Abdominal Pain This is a recurrent problem. The current episode started yesterday. The onset quality is gradual. The problem occurs constantly. The problem has been unchanged. The pain is located in the epigastric region. The pain is moderate. The quality of the pain is sharp and burning. The abdominal pain does not radiate. Associated symptoms include vomiting. Pertinent negatives include no constipation, diarrhea, dysuria, fever, frequency, headaches or nausea. Exacerbated by: menses. She has tried nothing for the symptoms.    Past Medical History  Diagnosis Date  . Asthma   . Depressed   . Fibromyalgia   . PTSD  (post-traumatic stress disorder)   . Opiate abuse, continuous    Past Surgical History  Procedure Laterality Date  . No past surgeries     Social History   Social History  . Marital Status: Single    Spouse Name: N/A  . Number of Children: N/A  . Years of Education: N/A   Occupational History  . Not on file.   Social History Main Topics  . Smoking status: Former Smoker -- 0.50 packs/day for 3 years    Types: Cigarettes    Quit date: 02/16/2014  . Smokeless tobacco: Current User  . Alcohol Use: No  . Drug Use: 5.00 per week    Special: Marijuana  . Sexual Activity: Yes    Birth Control/ Protection: Other-see comments     Comment: only with females   Other Topics Concern  . Not on file   Social History Narrative   No current facility-administered medications on file prior to encounter.   Current Outpatient Prescriptions on File Prior to Encounter  Medication Sig Dispense Refill  . EPINEPHrine (EPIPEN JR) 0.15 MG/0.3ML injection Inject 0.15 mg into the muscle as needed for anaphylaxis.     Allergies  Allergen Reactions  . Bee Venom Anaphylaxis    Has epi-pen    ROS:  Review of Systems  Constitutional: Negative for fever, chills and fatigue.  HENT: Negative for sinus pressure.   Eyes: Negative for photophobia.  Respiratory: Negative for shortness of breath.   Cardiovascular: Negative for chest pain.  Gastrointestinal: Positive for vomiting and abdominal pain. Negative for nausea, diarrhea and constipation.  Genitourinary: Negative for dysuria, frequency,  flank pain, vaginal bleeding, vaginal discharge, difficulty urinating, vaginal pain and pelvic pain.  Musculoskeletal: Negative for neck pain.  Neurological: Negative for dizziness, weakness and headaches.  Psychiatric/Behavioral: Negative.      I have reviewed patient's Past Medical Hx, Surgical Hx, Family Hx, Social Hx, medications and allergies.   Physical Exam   Patient Vitals for the past 24 hrs:   BP Temp Pulse Resp Height Weight  03/09/15 0008 122/62 mmHg - 80 18 - -  03/08/15 2024 128/68 mmHg 98.4 F (36.9 C) 76 18  (1.676 m) 61.961 kg (136 lb 9.6 oz)   Constitutional: Well-developed, well-nourished female in no acute distress.  Cardiovascular: normal rate Respiratory: normal effort GI: Abd soft, non-tender, gravid appropriate for gestational age. Pos BS x 4 MS: Extremities nontender, no edema, normal ROM Neurologic: Alert and oriented x 4.  GU: Neg CVAT.  PELVIC EXAM: Deferred   LAB RESULTS Results for orders placed or performed during the hospital encounter of 03/08/15 (from the past 24 hour(s))  Urinalysis, Routine w reflex microscopic (not at [facility])     Status: Abnormal   Collection Time: 03/08/15  8:30 PM  Result Value Ref Range   Color, Urine YELLOW YELLOW   APPearance CLEAR CLEAR   Specific Gravity, Urine 1.020 1.005 - 1.030   pH 6.5 5.0 - 8.0   Glucose, UA NEGATIVE NEGATIVE mg/dL   Hgb urine dipstick TRACE (A) NEGATIVE   Bilirubin Urine NEGATIVE NEGATIVE   Ketones, ur 15 (A) NEGATIVE mg/dL   Protein, ur NEGATIVE NEGATIVE mg/dL   Urobilinogen, UA 1.0 0.0 - 1.0 mg/dL   Nitrite NEGATIVE NEGATIVE   Leukocytes, UA NEGATIVE NEGATIVE  Urine microscopic-add on     Status: None   Collection Time: 03/08/15  8:30 PM  Result Value Ref Range   Squamous Epithelial / LPF RARE RARE   WBC, UA 0-2 <3 WBC/hpf   RBC / HPF 0-2 <3 RBC/hpf   Bacteria, UA RARE RARE   Urine-Other MUCOUS PRESENT   Pregnancy, urine POC     Status: None   Collection Time: 03/08/15  8:38 PM  Result Value Ref Range   Preg Test, Ur NEGATIVE NEGATIVE  CBC     Status: Abnormal   Collection Time: 03/08/15  9:15 PM  Result Value Ref Range   WBC 7.3 4.0 - 10.5 K/uL   RBC 4.20 3.87 - 5.11 MIL/uL   Hemoglobin 10.0 (L) 12.0 - 15.0 g/dL   HCT 78.2 (L) 95.6 - 21.3 %   MCV 75.7 (L) 78.0 - 100.0 fL   MCH 23.8 (L) 26.0 - 34.0 pg   MCHC 31.4 30.0 - 36.0 g/dL   RDW 08.6 (H) 57.8 - 46.9 %   Platelets  367 150 - 400 K/uL  Comprehensive metabolic panel     Status: Abnormal   Collection Time: 03/08/15  9:15 PM  Result Value Ref Range   Sodium 140 135 - 145 mmol/L   Potassium 3.4 (L) 3.5 - 5.1 mmol/L   Chloride 104 101 - 111 mmol/L   CO2 27 22 - 32 mmol/L   Glucose, Bld 89 65 - 99 mg/dL   BUN 11 6 - 20 mg/dL   Creatinine, Ser 6.29 0.44 - 1.00 mg/dL   Calcium 9.0 8.9 - 52.8 mg/dL   Total Protein 6.7 6.5 - 8.1 g/dL   Albumin 3.8 3.5 - 5.0 g/dL   AST 20 15 - 41 U/L   ALT 10 (L) 14 - 54 U/L   Alkaline  Phosphatase 48 38 - 126 U/L   Total Bilirubin 0.5 0.3 - 1.2 mg/dL   GFR calc non Af Amer >60 >60 mL/min   GFR calc Af Amer >60 >60 mL/min   Anion gap 9 5 - 15       MAU Management/MDM: Labs ordered and reviewed.  Zofran 8 mg ODT x 1 dose with complete relief of nausea.  GI Cocktail x 1 dose in MAU with complete relief of epigastric pain.  Pt stable at time of discharge.  ASSESSMENT 1. Epigastric abdominal pain   2. Abdominal pain in female patient   3. Nausea and vomiting, vomiting of unspecified type   4. Menstrual problem     PLAN Discharge home Phenergan 12.5-25 mg PO Q 6 hours PRN nausea  Zantac 150 mg BID PRN epigastric pain/heartburn Take medications as needed during menses with onset of GI symptoms F/U with primary care as needed Return to MAU as needed for emergencies    Medication List    STOP taking these medications        meloxicam 15 MG tablet  Commonly known as:  MOBIC     predniSONE 20 MG tablet  Commonly known as:  DELTASONE      TAKE these medications        EPINEPHrine 0.15 MG/0.3ML injection  Commonly known as:  EPIPEN JR  Inject 0.15 mg into the muscle as needed for anaphylaxis.     promethazine 25 MG tablet  Commonly known as:  PHENERGAN  Take 0.5-1 tablets (12.5-25 mg total) by mouth every 6 (six) hours as needed.     ranitidine 150 MG capsule  Commonly known as:  ZANTAC  Take 1 capsule (150 mg total) by mouth daily.       Follow-up  Information    Please follow up.   Why:  With primary care provider, As needed      Follow up with [facility].   Why:  As needed for emergencies   Contact information:   [address] [id number] mc [place] [address] [phone]      Sharen Counter Certified Nurse-Midwife 03/09/2015  1:13 AM

## 2015-03-08 NOTE — MAU Note (Signed)
Has bad n/v everytime I have my periods. Pain is upper abd. And is tightening feeling like someone is grabbing my upper stomach. Does not have a GYN doc. LMP 8/15 so period is going away now. No diarrhea

## 2015-03-08 NOTE — Discharge Instructions (Signed)
Abdominal Pain °Many things can cause abdominal pain. Usually, abdominal pain is not caused by a disease and will improve without treatment. It can often be observed and treated at home. Your health care provider will do a physical exam and possibly order blood tests and X-rays to help determine the seriousness of your pain. However, in many cases, more time must pass before a clear cause of the pain can be found. Before that point, your health care provider may not know if you need more testing or further treatment. °HOME CARE INSTRUCTIONS  °Monitor your abdominal pain for any changes. The following actions may help to alleviate any discomfort you are experiencing: °· Only take over-the-counter or prescription medicines as directed by your health care provider. °· Do not take laxatives unless directed to do so by your health care provider. °· Try a clear liquid diet (broth, tea, or water) as directed by your health care provider. Slowly move to a bland diet as tolerated. °SEEK MEDICAL CARE IF: °· You have unexplained abdominal pain. °· You have abdominal pain associated with nausea or diarrhea. °· You have pain when you urinate or have a bowel movement. °· You experience abdominal pain that wakes you in the night. °· You have abdominal pain that is worsened or improved by eating food. °· You have abdominal pain that is worsened with eating fatty foods. °· You have a fever. °SEEK IMMEDIATE MEDICAL CARE IF:  °· Your pain does not go away within 2 hours. °· You keep throwing up (vomiting). °· Your pain is felt only in portions of the abdomen, such as the right side or the left lower portion of the abdomen. °· You pass bloody or black tarry stools. °MAKE SURE YOU: °· Understand these instructions.   °· Will watch your condition.   °· Will get help right away if you are not doing well or get worse.   °Document Released: 04/14/2005 Document Revised: 07/10/2013 Document Reviewed: 03/14/2013 °ExitCare® Patient Information  ©2015 [employer]. This information is not intended to replace advice given to you by your health care provider. Make sure you discuss any questions you have with your health care provider. ° °Gastroesophageal Reflux Disease, Adult °Gastroesophageal reflux disease (GERD) happens when acid from your stomach flows up into the esophagus. When acid comes in contact with the esophagus, the acid causes soreness (inflammation) in the esophagus. Over time, GERD may create small holes (ulcers) in the lining of the esophagus. °CAUSES  °· Increased body weight. This puts pressure on the stomach, making acid rise from the stomach into the esophagus. °· Smoking. This increases acid production in the stomach. °· Drinking alcohol. This causes decreased pressure in the lower esophageal sphincter (valve or ring of muscle between the esophagus and stomach), allowing acid from the stomach into the esophagus. °· Late evening meals and a full stomach. This increases pressure and acid production in the stomach. °· A malformed lower esophageal sphincter. °Sometimes, no cause is found. °SYMPTOMS  °· Burning pain in the lower part of the mid-chest behind the breastbone and in the mid-stomach area. This may occur twice a week or more often. °· Trouble swallowing. °· Sore throat. °· Dry cough. °· Asthma-like symptoms including chest tightness, shortness of breath, or wheezing. °DIAGNOSIS  °Your caregiver may be able to diagnose GERD based on your symptoms. In some cases, X-rays and other tests may be done to check for complications or to check the condition of your stomach and esophagus. °TREATMENT  °Your caregiver may   recommend over-the-counter or prescription medicines to help decrease acid production. Ask your caregiver before starting or adding any new medicines.  °HOME CARE INSTRUCTIONS  °· Change the factors that you can control. Ask your caregiver for guidance concerning weight loss, quitting smoking, and alcohol  consumption. °· Avoid foods and drinks that make your symptoms worse, such as: °¨ Caffeine or alcoholic drinks. °¨ Chocolate. °¨ Peppermint or mint flavorings. °¨ Garlic and onions. °¨ Spicy foods. °¨ Citrus fruits, such as oranges, lemons, or limes. °¨ Tomato-based foods such as sauce, chili, salsa, and pizza. °¨ Fried and fatty foods. °· Avoid lying down for the 3 hours prior to your bedtime or prior to taking a nap. °· Eat small, frequent meals instead of large meals. °· Wear loose-fitting clothing. Do not wear anything tight around your waist that causes pressure on your stomach. °· Raise the head of your bed 6 to 8 inches with wood blocks to help you sleep. Extra pillows will not help. °· Only take over-the-counter or prescription medicines for pain, discomfort, or fever as directed by your caregiver. °· Do not take aspirin, ibuprofen, or other nonsteroidal anti-inflammatory drugs (NSAIDs). °SEEK IMMEDIATE MEDICAL CARE IF:  °· You have pain in your arms, neck, jaw, teeth, or back. °· Your pain increases or changes in intensity or duration. °· You develop nausea, vomiting, or sweating (diaphoresis). °· You develop shortness of breath, or you faint. °· Your vomit is green, yellow, black, or looks like coffee grounds or blood. °· Your stool is red, bloody, or black. °These symptoms could be signs of other problems, such as heart disease, gastric bleeding, or esophageal bleeding. °MAKE SURE YOU:  °· Understand these instructions. °· Will watch your condition. °· Will get help right away if you are not doing well or get worse. °Document Released: 04/14/2005 Document Revised: 09/27/2011 Document Reviewed: 01/22/2011 °ExitCare® Patient Information ©2015 [employer]. This information is not intended to replace advice given to you by your health care provider. Make sure you discuss any questions you have with your health care provider. ° °

## 2015-05-14 ENCOUNTER — Encounter (HOSPITAL_COMMUNITY): Payer: Self-pay | Admitting: Emergency Medicine

## 2015-05-14 ENCOUNTER — Emergency Department (HOSPITAL_COMMUNITY)
Admission: EM | Admit: 2015-05-14 | Discharge: 2015-05-14 | Disposition: A | Discharge status: Home / Self Care | Payer: BLUE CROSS/BLUE SHIELD | Attending: Emergency Medicine | Admitting: Emergency Medicine

## 2015-05-14 DIAGNOSIS — K029 Dental caries, unspecified: Secondary | ICD-10-CM | POA: Insufficient documentation

## 2015-05-14 DIAGNOSIS — Z79899 Other long term (current) drug therapy: Secondary | ICD-10-CM | POA: Insufficient documentation

## 2015-05-14 DIAGNOSIS — Z8659 Personal history of other mental and behavioral disorders: Secondary | ICD-10-CM | POA: Diagnosis not present

## 2015-05-14 DIAGNOSIS — Z87891 Personal history of nicotine dependence: Secondary | ICD-10-CM | POA: Insufficient documentation

## 2015-05-14 DIAGNOSIS — Z8739 Personal history of other diseases of the musculoskeletal system and connective tissue: Secondary | ICD-10-CM | POA: Insufficient documentation

## 2015-05-14 DIAGNOSIS — J45909 Unspecified asthma, uncomplicated: Secondary | ICD-10-CM | POA: Diagnosis not present

## 2015-05-14 DIAGNOSIS — K0889 Other specified disorders of teeth and supporting structures: Secondary | ICD-10-CM | POA: Diagnosis present

## 2015-05-14 MED ORDER — AMOXICILLIN 500 MG PO CAPS: 500.0000 mg | ORAL_CAPSULE | Freq: Three times a day (TID) | ORAL | Status: DC

## 2015-05-14 MED ORDER — NAPROXEN 500 MG PO TABS: 500.0000 mg | ORAL_TABLET | Freq: Two times a day (BID) | ORAL | Status: DC
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
## 2015-05-14 NOTE — Discharge Instructions (Signed)
Call Dr. Leanord AsalFarless to schedule an appointment  Dental Caries Dental caries is tooth decay. This decay can cause a hole in teeth (cavity) that can get bigger and deeper over time. HOME CARE  Brush and floss your teeth. Do this at least two times a day.  Use a fluoride toothpaste.  Use a mouth rinse if told by your dentist or doctor.  Eat less sugary and starchy foods. Drink less sugary drinks.  Avoid snacking often on sugary and starchy foods. Avoid sipping often on sugary drinks.  Keep regular checkups and cleanings with your dentist.  Use fluoride supplements if told by your dentist or doctor.  Allow fluoride to be applied to teeth if told by your dentist or doctor.   This information is not intended to replace advice given to you by your health care provider. Make sure you discuss any questions you have with your health care provider.   Document Released: 04/13/2008 Document Revised: 07/26/2014 Document Reviewed: 07/07/2012 Elsevier Interactive Patient Education [employer]2016 [employer] Inc.
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
## 2015-05-15 ENCOUNTER — Emergency Department (HOSPITAL_COMMUNITY)
Admission: EM | Admit: 2015-05-15 | Discharge: 2015-05-16 | Disposition: A | Discharge status: Home / Self Care | Payer: BLUE CROSS/BLUE SHIELD | Attending: Emergency Medicine | Admitting: Emergency Medicine

## 2015-05-15 ENCOUNTER — Encounter (HOSPITAL_COMMUNITY): Payer: Self-pay | Admitting: *Deleted

## 2015-05-15 DIAGNOSIS — S61512A Laceration without foreign body of left wrist, initial encounter: Secondary | ICD-10-CM | POA: Insufficient documentation

## 2015-05-15 DIAGNOSIS — F331 Major depressive disorder, recurrent, moderate: Secondary | ICD-10-CM | POA: Diagnosis present

## 2015-05-15 DIAGNOSIS — Z87891 Personal history of nicotine dependence: Secondary | ICD-10-CM | POA: Diagnosis not present

## 2015-05-15 DIAGNOSIS — Y9289 Other specified places as the place of occurrence of the external cause: Secondary | ICD-10-CM | POA: Insufficient documentation

## 2015-05-15 DIAGNOSIS — Y998 Other external cause status: Secondary | ICD-10-CM | POA: Diagnosis not present

## 2015-05-15 DIAGNOSIS — Y9389 Activity, other specified: Secondary | ICD-10-CM | POA: Diagnosis not present

## 2015-05-15 DIAGNOSIS — J45909 Unspecified asthma, uncomplicated: Secondary | ICD-10-CM | POA: Insufficient documentation

## 2015-05-15 DIAGNOSIS — R45851 Suicidal ideations: Secondary | ICD-10-CM | POA: Diagnosis present

## 2015-05-15 DIAGNOSIS — Z3202 Encounter for pregnancy test, result negative: Secondary | ICD-10-CM | POA: Diagnosis not present

## 2015-05-15 DIAGNOSIS — X788XXA Intentional self-harm by other sharp object, initial encounter: Secondary | ICD-10-CM | POA: Insufficient documentation

## 2015-05-15 DIAGNOSIS — Z792 Long term (current) use of antibiotics: Secondary | ICD-10-CM | POA: Insufficient documentation

## 2015-05-15 DIAGNOSIS — F1220 Cannabis dependence, uncomplicated: Secondary | ICD-10-CM | POA: Diagnosis not present

## 2015-05-15 DIAGNOSIS — F1110 Opioid abuse, uncomplicated: Secondary | ICD-10-CM | POA: Insufficient documentation

## 2015-05-15 LAB — PREGNANCY, URINE: PREG TEST UR: NEGATIVE

## 2015-05-15 LAB — CBC WITH DIFFERENTIAL/PLATELET
Basophils Absolute: 0 10*3/uL (ref 0.0–0.1)
Basophils Relative: 0 %
Eosinophils Absolute: 0.7 10*3/uL (ref 0.0–0.7)
Eosinophils Relative: 8 %
HEMATOCRIT: 35.4 % — AB (ref 36.0–46.0)
Hemoglobin: 11.1 g/dL — ABNORMAL LOW (ref 12.0–15.0)
LYMPHS PCT: 45 %
Lymphs Abs: 3.6 10*3/uL (ref 0.7–4.0)
MCH: 23.8 pg — ABNORMAL LOW (ref 26.0–34.0)
MCHC: 31.4 g/dL (ref 30.0–36.0)
MCV: 76 fL — AB (ref 78.0–100.0)
MONO ABS: 0.4 10*3/uL (ref 0.1–1.0)
MONOS PCT: 5 %
NEUTROS ABS: 3.4 10*3/uL (ref 1.7–7.7)
Neutrophils Relative %: 42 %
Platelets: 340 10*3/uL (ref 150–400)
RBC: 4.66 MIL/uL (ref 3.87–5.11)
RDW: 15.6 % — AB (ref 11.5–15.5)
WBC: 8 10*3/uL (ref 4.0–10.5)

## 2015-05-15 LAB — COMPREHENSIVE METABOLIC PANEL
ALT: 9 U/L — ABNORMAL LOW (ref 14–54)
ANION GAP: 6 (ref 5–15)
AST: 23 U/L (ref 15–41)
Albumin: 4.1 g/dL (ref 3.5–5.0)
Alkaline Phosphatase: 46 U/L (ref 38–126)
BILIRUBIN TOTAL: 0.3 mg/dL (ref 0.3–1.2)
BUN: 6 mg/dL (ref 6–20)
CALCIUM: 8.9 mg/dL (ref 8.9–10.3)
CO2: 24 mmol/L (ref 22–32)
CREATININE: 0.87 mg/dL (ref 0.44–1.00)
Chloride: 107 mmol/L (ref 101–111)
GFR calc Af Amer: >60 mL/min (ref >60)
GLUCOSE: 83 mg/dL (ref 65–99)
Potassium: 3.8 mmol/L (ref 3.5–5.1)
Sodium: 137 mmol/L (ref 135–145)
TOTAL PROTEIN: 7.3 g/dL (ref 6.5–8.1)

## 2015-05-15 LAB — RAPID URINE DRUG SCREEN, HOSP PERFORMED
Amphetamines: NOT DETECTED
BENZODIAZEPINES: NOT DETECTED
Barbiturates: NOT DETECTED
Cocaine: NOT DETECTED
Opiates: POSITIVE — AB
Tetrahydrocannabinol: NOT DETECTED

## 2015-05-15 LAB — ETHANOL

## 2015-05-15 MED ORDER — AMOXICILLIN 500 MG PO CAPS
500.0000 mg | ORAL_CAPSULE | Freq: Three times a day (TID) | ORAL | Status: DC
  Administered 2015-05-15 – 2015-05-16 (×2): 500 mg via ORAL
  Filled 2015-05-15 (×2): qty 1

## 2015-05-15 MED ORDER — HYDROCODONE-ACETAMINOPHEN 5-325 MG PO TABS
1.0000 | ORAL_TABLET | Freq: Once | ORAL | Status: AC
  Administered 2015-05-15: 1 via ORAL
  Filled 2015-05-15: qty 1

## 2015-05-15 MED ORDER — PROMETHAZINE HCL 25 MG PO TABS: 12.5000 mg | ORAL_TABLET | Freq: Four times a day (QID) | ORAL | Status: DC | PRN

## 2015-05-15 MED ORDER — FAMOTIDINE 20 MG PO TABS
20.0000 mg | ORAL_TABLET | Freq: Every day | ORAL | Status: DC
  Administered 2015-05-16: 20 mg via ORAL
  Filled 2015-05-15: qty 1
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
## 2015-05-15 NOTE — ED Provider Notes (Signed)
CSN: [id number]     Arrival date & time 05/15/15  1929 History   First MD Initiated Contact with Patient 05/15/15 1937     Chief Complaint  Patient presents with  . Suicidal  . Extremity Laceration     (Consider location/radiation/quality/duration/timing/severity/associated sxs/prior Treatment) Patient is a 24 y.o. female presenting with altered mental status. The history is provided by the patient (The patient cut her left wrist today because she is depressed 1 herself.).  Altered Mental Status Presenting symptoms: behavior changes   Severity:  Severe Most recent episode:  Today Episode history:  Single Timing:  Constant Progression:  Unchanged Associated symptoms: no abdominal pain, no hallucinations, no headaches, no rash and no seizures     Past Medical History  Diagnosis Date  . Asthma   . Depressed   . Fibromyalgia   . PTSD (post-traumatic stress disorder)   . Opiate abuse, continuous    Past Surgical History  Procedure Laterality Date  . No past surgeries     No family history on file. Social History  Substance Use Topics  . Smoking status: Former Smoker -- 0.50 packs/day for 3 years    Types: Cigarettes    Quit date: 02/16/2014  . Smokeless tobacco: Current User  . Alcohol Use: 0.6 oz/week    1 Cans of beer per week   OB History    Gravida Para Term Preterm AB TAB SAB Ectopic Multiple Living       Review of Systems  Constitutional: Negative for appetite change and fatigue.  HENT: Negative for congestion, ear discharge and sinus pressure.   Eyes: Negative for discharge.  Respiratory: Negative for cough.   Cardiovascular: Negative for chest pain.  Gastrointestinal: Negative for abdominal pain and diarrhea.  Genitourinary: Negative for frequency and hematuria.  Musculoskeletal: Negative for back pain.  Skin: Negative for rash.  Neurological: Negative for seizures and headaches.  Psychiatric/Behavioral: Negative for hallucinations.        Depressed suicidal      Allergies  Bee venom  Home Medications   Prior to Admission medications   Medication Sig Start Date End Date Taking? Authorizing Provider  amoxicillin (AMOXIL) 500 MG capsule Take 1 capsule (500 mg total) by mouth 3 (three) times daily. Patient taking differently: Take 500 mg by mouth 3 (three) times daily. ABT Start Date 05/14/15 & End Date 05/23/15. 05/14/15  Yes Hope Orlene Och, NP  promethazine (PHENERGAN) 25 MG tablet Take 0.5-1 tablets (12.5-25 mg total) by mouth every 6 (six) hours as needed. 03/08/15   Misty Stanley A Leftwich-Kirby, CNM  ranitidine (ZANTAC) 150 MG capsule Take 1 capsule (150 mg total) by mouth daily. Patient not taking: Reported on 05/15/2015 03/08/15   Wilmer Floor Leftwich-Kirby, CNM   There were no vitals taken for this visit. Physical Exam  Constitutional: She is oriented to person, place, and time. She appears well-developed.  HENT:  Head: Normocephalic.  Eyes: Conjunctivae and EOM are normal. No scleral icterus.  Neck: Neck supple. No thyromegaly present.  Cardiovascular: Normal rate and regular rhythm.  Exam reveals no gallop and no friction rub.   No murmur heard. Pulmonary/Chest: No stridor. She has no wheezes. She has no rales. She exhibits no tenderness.  Abdominal: She exhibits no distension. There is no tenderness. There is no rebound.  Musculoskeletal: Normal range of motion. She exhibits no edema.  Lymphadenopathy:    She has no cervical adenopathy.  Neurological: She is oriented  to person, place, and time. She exhibits normal muscle tone. Coordination normal.  Skin: No rash noted. No erythema.  Psychiatric:  Depressed and suicidal    ED Course  Procedures (including critical care time) Labs Review Labs Reviewed  CBC WITH DIFFERENTIAL/PLATELET - Abnormal; Notable for the following:    Hemoglobin 11.1 (*)    HCT 35.4 (*)    MCV 76.0 (*)    MCH 23.8 (*)    RDW 15.6 (*)    All other components within normal limits   COMPREHENSIVE METABOLIC PANEL - Abnormal; Notable for the following:    ALT 9 (*)    All other components within normal limits  URINE RAPID DRUG SCREEN, HOSP PERFORMED - Abnormal; Notable for the following:    Opiates POSITIVE (*)    All other components within normal limits  ETHANOL  PREGNANCY, URINE    Imaging Review No results found. I have personally reviewed and evaluated these images and lab results as part of my medical decision-making.   EKG Interpretation None      MDM   Final diagnoses:  None    Pt medically cleared and will be evaluated by psyc in the am   Bethann BerkshireJoseph Kyrillos Adams, MD 05/15/15 2314

## 2015-05-15 NOTE — ED Notes (Signed)
Pt in TCU room 30 talking to TTS
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
## 2015-05-16 DIAGNOSIS — F331 Major depressive disorder, recurrent, moderate: Secondary | ICD-10-CM | POA: Diagnosis not present

## 2015-05-16 MED ORDER — HYDROCODONE-ACETAMINOPHEN 5-325 MG PO TABS
1.0000 | ORAL_TABLET | Freq: Once | ORAL | Status: AC
  Administered 2015-05-16: 1 via ORAL
  Filled 2015-05-16: qty 1
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
## 2015-05-16 NOTE — ED Notes (Signed)
Patient is A&Ox4 with mom at bedside. Patient denies SI/HI and A/V hallucinations. Patient states that if her grandmother or anyone gets on her nerves again she will just leave the house, go for a walk, or remove herself from the situation. Patient verbalized understanding to find staff if she no longer feels safe or if anything changes.  Molly Perkins, Wyman SongsterAngela Marie, RN

## 2015-05-16 NOTE — [facility] Suicide Risk Assessment (Signed)
Suicide Risk Assessment  Discharge Assessment   [facility]   Demographic Factors:  Adolescent or young adult  Total Time spent with patient: 45 minutes    Musculoskeletal: Strength & Muscle Tone: within normal limits Gait & Station: normal Patient leans: N/A  Psychiatric Specialty Exam: Review of Systems  Constitutional: Negative.   HENT: Negative.   Eyes: Negative.   Respiratory: Negative.   Cardiovascular: Negative.   Gastrointestinal: Negative.   Genitourinary: Negative.   Musculoskeletal: Negative.   Skin: Negative.   Neurological: Negative.   Endo/Heme/Allergies: Negative.   Psychiatric/Behavioral: Positive for depression.    Blood pressure 111/61, pulse 84, temperature 98.5 F (36.9 C), temperature source Oral, resp. rate 18, SpO2 100 %.There is no weight on file to calculate BMI.  General Appearance: Casual  Eye Contact::  Good  Speech:  Normal Rate  Volume:  Normal  Mood:  Depressed, mild  Affect:  Congruent  Thought Process:  Coherent  Orientation:  Full (Time, Place, and Person)  Thought Content:  WDL  Suicidal Thoughts:  No  Homicidal Thoughts:  No  Memory:  Immediate;   Good Recent;   Good Remote;   Good  Judgement:  Fair  Insight:  Fair  Psychomotor Activity:  Normal  Concentration:  Good  Recall:  Good  Fund of Knowledge:Good  Language: Good  Akathisia:  No  Handed:  Right  AIMS (if indicated):     Assets:  Housing Leisure Time Physical Health Resilience Social Support Vocational/Educational  ADL's:  Intact  Cognition: WNL  Sleep:      Has this patient used any form of tobacco in the last 30 days? (Cigarettes, Smokeless Tobacco, Cigars, and/or Pipes) No  Mental Status Per Nursing Assessment::   On Admission:   Cut wrist, depression  Current Mental Status by Physician: NA  Loss Factors: NA  Historical Factors: NA  Risk Reduction Factors:   Sense of responsibility to family, Living with another  person, especially a relative and Positive social support  Continued Clinical Symptoms:  Depression, mild  Cognitive Features That Contribute To Risk:  None    Suicide Risk:  Minimal: No identifiable suicidal ideation.  Patients presenting with no risk factors but with morbid ruminations; may be classified as minimal risk based on the severity of the depressive symptoms  Principal Problem: Moderate recurrent major depression [facility](HCC) Discharge Diagnoses:  Patient Active Problem List   Diagnosis Date Noted  . Moderate recurrent major depression ([facility]) [F33.1] 05/16/2015    Priority: High  . Marijuana dependence ([facility]) [F12.20] 05/04/2012    Priority: High    Class: Chronic  . Fibromyalgia [M79.7] 10/19/2012  . Neuropathic pain of hand [G56.90] 10/19/2012      Plan Of Care/Follow-up recommendations:  Activity:  as tolerated Diet:  heart healthy diet  Is patient on multiple antipsychotic therapies at discharge:  No   Has Patient had three or more failed trials of antipsychotic monotherapy by history:  No  Recommended Plan for Multiple Antipsychotic Therapies: NA    LORD, JAMISON, PMH-NP 05/16/2015, 4:05 PM

## 2015-05-16 NOTE — BH Assessment (Signed)
[facility] Assessment Progress Note  Per Thedore MinsMojeed Akintayo, MD, this pt does not require psychiatric hospitalization at this time. [facility]. This has been included in pt's discharge instructions. Pt's nurse has been notified.  Doylene Canninghomas Lorriann Hansmann, MA Triage Specialist [phone]
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
## 2015-05-16 NOTE — Discharge Instructions (Signed)
For your ongoing behavioral health needs you are advised to follow up at your earliest opportunity with the [facility] at [facility].  New patients are seen during walking hours, Monday - Friday from 8:00 am - 5:00 pm: ° °     [facility] °     Counseling Services °     [address] °     [phone] °

## 2015-05-16 NOTE — ED Notes (Signed)
Patient is A&Ox4, denies SI/HI and A/V hallucinations. Patient is being discharged home with family. All belongings returned to patient.   Martinez Boxx, Wyman SongsterAngela Marie, RN

## 2015-05-18 NOTE — Progress Notes (Signed)
Per Wille CelesteJanie, RN, patient's mother called stating that Prozac that was ordered at time of discharge was not called into pharmacy. AVS reviewed and no Rx given at time of discharge. Treatment plan as noted by Nanine MeansJamison Lord, DNP PMHNP on 05/16/15 was to initiate Prozac 10 mg daily and have patient follow up with [facility].   Rx for Prozac 10 mg PO daily #30 with no refills called into [facility] on [address] at [phone] by this practitioner.  Alberteen SamFran Hobson, FNP-BC [facility] 05/18/2015      12:40pm  Patient seen face-to-face for psychiatric evaluation, chart reviewed and case discussed with the physician extender and developed treatment plan. Reviewed the information documented and agree with the treatment plan. Thedore MinsMojeed Taneeka Curtner, MD

## 2015-06-04 ENCOUNTER — Emergency Department (HOSPITAL_COMMUNITY)
Admission: EM | Admit: 2015-06-04 | Discharge: 2015-06-04 | Disposition: A | Discharge status: Home / Self Care | Payer: BLUE CROSS/BLUE SHIELD | Attending: Physician Assistant | Admitting: Physician Assistant

## 2015-06-04 ENCOUNTER — Encounter (HOSPITAL_COMMUNITY): Payer: Self-pay | Admitting: *Deleted

## 2015-06-04 DIAGNOSIS — R258 Other abnormal involuntary movements: Secondary | ICD-10-CM | POA: Diagnosis not present

## 2015-06-04 DIAGNOSIS — E872 Acidosis, unspecified: Secondary | ICD-10-CM

## 2015-06-04 DIAGNOSIS — M6289 Other specified disorders of muscle: Secondary | ICD-10-CM | POA: Diagnosis not present

## 2015-06-04 DIAGNOSIS — J45909 Unspecified asthma, uncomplicated: Secondary | ICD-10-CM | POA: Insufficient documentation

## 2015-06-04 DIAGNOSIS — F1210 Cannabis abuse, uncomplicated: Secondary | ICD-10-CM | POA: Insufficient documentation

## 2015-06-04 DIAGNOSIS — Z3202 Encounter for pregnancy test, result negative: Secondary | ICD-10-CM | POA: Diagnosis not present

## 2015-06-04 DIAGNOSIS — Z792 Long term (current) use of antibiotics: Secondary | ICD-10-CM | POA: Insufficient documentation

## 2015-06-04 DIAGNOSIS — M797 Fibromyalgia: Secondary | ICD-10-CM | POA: Insufficient documentation

## 2015-06-04 DIAGNOSIS — F329 Major depressive disorder, single episode, unspecified: Secondary | ICD-10-CM | POA: Insufficient documentation

## 2015-06-04 DIAGNOSIS — Z87891 Personal history of nicotine dependence: Secondary | ICD-10-CM | POA: Insufficient documentation

## 2015-06-04 DIAGNOSIS — R000 Tachycardia, unspecified: Secondary | ICD-10-CM | POA: Diagnosis not present

## 2015-06-04 DIAGNOSIS — R29898 Other symptoms and signs involving the musculoskeletal system: Secondary | ICD-10-CM

## 2015-06-04 DIAGNOSIS — Z79899 Other long term (current) drug therapy: Secondary | ICD-10-CM | POA: Diagnosis not present

## 2015-06-04 LAB — CBC
HEMATOCRIT: 33.2 % — AB (ref 36.0–46.0)
HEMOGLOBIN: 10.3 g/dL — AB (ref 12.0–15.0)
MCH: 23.7 pg — ABNORMAL LOW (ref 26.0–34.0)
MCHC: 31 g/dL (ref 30.0–36.0)
MCV: 76.3 fL — ABNORMAL LOW (ref 78.0–100.0)
Platelets: 471 10*3/uL — ABNORMAL HIGH (ref 150–400)
RBC: 4.35 MIL/uL (ref 3.87–5.11)
RDW: 15.7 % — ABNORMAL HIGH (ref 11.5–15.5)
WBC: 16.2 10*3/uL — AB (ref 4.0–10.5)

## 2015-06-04 LAB — I-STAT TROPONIN, ED: Troponin i, poc: 0.01 ng/mL (ref 0.00–0.08)

## 2015-06-04 LAB — COMPREHENSIVE METABOLIC PANEL
ALT: 12 U/L — ABNORMAL LOW (ref 14–54)
ANION GAP: 9 (ref 5–15)
AST: 22 U/L (ref 15–41)
Albumin: 3.9 g/dL (ref 3.5–5.0)
Alkaline Phosphatase: 52 U/L (ref 38–126)
BILIRUBIN TOTAL: 0.4 mg/dL (ref 0.3–1.2)
BUN: 7 mg/dL (ref 6–20)
CO2: 22 mmol/L (ref 22–32)
Calcium: 9.1 mg/dL (ref 8.9–10.3)
Chloride: 108 mmol/L (ref 101–111)
Creatinine, Ser: 0.82 mg/dL (ref 0.44–1.00)
GFR calc Af Amer: >60 mL/min (ref >60)
Glucose, Bld: 164 mg/dL — ABNORMAL HIGH (ref 65–99)
POTASSIUM: 3.6 mmol/L (ref 3.5–5.1)
Sodium: 139 mmol/L (ref 135–145)
TOTAL PROTEIN: 7 g/dL (ref 6.5–8.1)

## 2015-06-04 LAB — ACETAMINOPHEN LEVEL: Acetaminophen (Tylenol), Serum: <10 ug/mL — ABNORMAL LOW (ref 10–30)

## 2015-06-04 LAB — RAPID URINE DRUG SCREEN, HOSP PERFORMED
AMPHETAMINES: NOT DETECTED
BARBITURATES: NOT DETECTED
BENZODIAZEPINES: NOT DETECTED
COCAINE: NOT DETECTED
Opiates: NOT DETECTED
TETRAHYDROCANNABINOL: POSITIVE — AB

## 2015-06-04 LAB — CK: CK TOTAL: 58 U/L (ref 38–234)

## 2015-06-04 LAB — POC URINE PREG, ED: Preg Test, Ur: NEGATIVE

## 2015-06-04 LAB — I-STAT CG4 LACTIC ACID, ED
LACTIC ACID, VENOUS: 1.32 mmol/L (ref 0.5–2.0)
Lactic Acid, Venous: 2.66 mmol/L (ref 0.5–2.0)

## 2015-06-04 LAB — SALICYLATE LEVEL: Salicylate Lvl: <4 mg/dL (ref 2.8–30.0)

## 2015-06-04 MED ORDER — LORAZEPAM 2 MG/ML IJ SOLN
1.0000 mg | Freq: Once | INTRAMUSCULAR | Status: AC
  Administered 2015-06-04: 1 mg via INTRAVENOUS
  Filled 2015-06-04: qty 1

## 2015-06-04 MED ORDER — SODIUM CHLORIDE 0.9 % IV BOLUS (SEPSIS)
1000.0000 mL | Freq: Once | INTRAVENOUS | Status: AC
  Administered 2015-06-04: 1000 mL via INTRAVENOUS

## 2015-06-04 NOTE — ED Notes (Signed)
Pt requesting to leave. md made aware

## 2015-06-04 NOTE — ED Notes (Addendum)
Pt reports she attempted SI a few weeks ago, has been on prozac for 2 weeks, started having neck stiffness/body tensing up after taking her medication today.   In triage pt complaining of neck pain and stiffness. Pt having difficulty moving. Pt denies other medications or drugs.

## 2015-06-04 NOTE — ED Provider Notes (Signed)
CSN: [id number]     Arrival date & time 06/04/15  1208 History   First MD Initiated Contact with Patient 06/04/15 1246     Chief Complaint  Patient presents with  . Neck Stiffness, New Prozac Rx      (Consider location/radiation/quality/duration/timing/severity/associated sxs/prior Treatment) HPI Comments: [age] year old female with past medical history including asthma, depression, PTSD, fibromyalgia who presents with muscle rigidity. Patient told EMS that she was sitting in class when she began tensing up, feeling like she couldn't control her body. She has been on Prozac for 2 weeks after a suicide attempt a few weeks ago. She reports neck stiffness and body stiffness with difficulty moving. She denies taking any other medications or drugs. She states "I'm having a mental breakdown." She denies any headache, visual changes, abdominal pain, vomiting, or fevers.  The history is provided by the patient.    Past Medical History  Diagnosis Date  . Asthma   . Depressed   . Fibromyalgia   . PTSD (post-traumatic stress disorder)   . Opiate abuse, continuous    Past Surgical History  Procedure Laterality Date  . No past surgeries     History reviewed. No pertinent family history. Social History  Substance Use Topics  . Smoking status: Former Smoker -- 0.50 packs/day for 3 years    Types: Cigarettes    Quit date: 02/16/2014  . Smokeless tobacco: Current User  . Alcohol Use: 0.6 oz/week    1 Cans of beer per week   OB History    Gravida Para Term Preterm AB TAB SAB Ectopic Multiple Living   0 0 0 0 0 0 0 0 0 0      Review of Systems  Unable to perform ROS: Mental status change      Allergies  Bee venom  Home Medications   Prior to Admission medications   Medication Sig Start Date End Date Taking? Authorizing Provider  FLUoxetine (PROZAC) 10 MG capsule Take 10 mg by mouth daily.   Yes Historical Provider, MD  amoxicillin (AMOXIL) 500 MG capsule Take 1 capsule (500 mg  total) by mouth 3 (three) times daily. Patient taking differently: Take 500 mg by mouth 3 (three) times daily. ABT Start Date 05/14/15 & End Date 05/23/15. 05/14/15   Janne NapoleonHope M Neese, NP  promethazine (PHENERGAN) 25 MG tablet Take 0.5-1 tablets (12.5-25 mg total) by mouth every 6 (six) hours as needed. 03/08/15   Misty StanleyLisa A Leftwich-Kirby, CNM  ranitidine (ZANTAC) 150 MG capsule Take 1 capsule (150 mg total) by mouth daily. Patient not taking: Reported on 05/15/2015 03/08/15   Misty StanleyLisa A Leftwich-Kirby, CNM   BP 117/81 mmHg  Pulse 105  Temp(Src) 97.5 F (36.4 C) (Oral)  Resp 16  SpO2 100% Physical Exam  Constitutional: She appears well-developed and well-nourished.  Rigid body, holding arms and legs in extension, neck tensed, extended, and turned to L,   HENT:  Head: Normocephalic and atraumatic.  Moist mucous membranes  Eyes: Conjunctivae are normal. Pupils are equal, round, and reactive to light.  Neck:  Tense and rigid with  Occasional moments where she relaxes and shows normal ROM  Cardiovascular: Regular rhythm and normal heart sounds.   No murmur heard. tachycardia  Pulmonary/Chest: Effort normal and breath sounds normal.  Abdominal: Soft. Bowel sounds are normal. She exhibits no distension. There is no tenderness.  Musculoskeletal: She exhibits no edema.  Neurological: She exhibits abnormal muscle tone.  Alert, oriented to person and place, slowed response time but able to  answer questions; full body rigidity with inducible clonus in b/l feet R>L  Skin: Skin is warm and dry.  Nursing note and vitals reviewed.   ED Course  .Critical Care Performed by: Laurence Spates Authorized by: Laurence Spates Total critical care time: 30 minutes Critical care time was exclusive of separately billable procedures and treating other patients. Critical care was necessary to treat or prevent imminent or life-threatening deterioration of the following conditions: toxidrome. Critical care  was time spent personally by me on the following activities: development of treatment plan with patient or surrogate, discussions with consultants, interpretation of cardiac output measurements, evaluation of patient's response to treatment, examination of patient, obtaining history from patient or surrogate, ordering and performing treatments and interventions, ordering and review of laboratory studies, ordering and review of radiographic studies, re-evaluation of patient's condition and review of old charts.   (including critical care time) Labs Review Labs Reviewed  COMPREHENSIVE METABOLIC PANEL - Abnormal; Notable for the following:    Glucose, Bld 164 (*)    ALT 12 (*)    All other components within normal limits  CBC - Abnormal; Notable for the following:    WBC 16.2 (*)    Hemoglobin 10.3 (*)    HCT 33.2 (*)    MCV 76.3 (*)    MCH 23.7 (*)    RDW 15.7 (*)    Platelets 471 (*)    All other components within normal limits  URINE RAPID DRUG SCREEN, HOSP PERFORMED - Abnormal; Notable for the following:    Tetrahydrocannabinol POSITIVE (*)    All other components within normal limits  ACETAMINOPHEN LEVEL - Abnormal; Notable for the following:    Acetaminophen (Tylenol), Serum <10 (*)    All other components within normal limits  I-STAT CG4 LACTIC ACID, ED - Abnormal; Notable for the following:    Lactic Acid, Venous 2.66 (*)    All other components within normal limits  CK  SALICYLATE LEVEL  POC URINE PREG, ED  I-STAT TROPOININ, ED  I-STAT CG4 LACTIC ACID, ED    Imaging Review No results found. I have personally reviewed and evaluated these lab results as part of my medical decision-making.   EKG Interpretation   Date/Time:  Wednesday June 04 2015 12:47:54 EST Ventricular Rate:  123 PR Interval:  157 QRS Duration: 68 QT Interval:  324 QTC Calculation: 463 R Axis:   78 Text Interpretation:  Sinus tachycardia Atrial premature complex  Borderline T abnormalities,  anterior leads Confirmed by Jrue Yambao MD, Yacob Wilkerson  [phone]) on 06/04/2015 1:50:00 PM      MDM   Final diagnoses:  Muscle rigidity  Clonus  Lactic acidosis   Patient presents with rigidity and neck stiffness as well as difficulty controlling her body that began this morning. Patient has been on Prozac for the past 2 weeks after a suicide attempt. On arrival by EMS, the patient was stiff with her neck extended and turned to the left, extremities extended and with tremor. She was tachycardic but afebrile. She had inducible clonus in bilateral feet. Obtained above labs including CK, UDS and gave the patient an IV fluid bolus and Ativan.  After receiving a total of 2 mg IV Ativan, the patient was resting comfortably. Initial lactate elevated at 2.66; repeat lactate was normal after 2 L of IV fluids. WBC 16.2. The patient denies any fevers or illness and I suspect that this is a stress response. UDS positive for THC. The remainder of her labs were reassuring.  I discussed the patient twice with poison control; they ultimately felt that this was not completely consistent with serotonin syndrome. The patient denies any over-the-counter medications or overdose on any medications and she denies any illicit drug use. She has remained comfortable in the ED with stable vital signs since receiving Ativan. We will observe for a few more hours and if she remains stable she will be able to go home. She is already ambulating and tolerating PO in ED.   Laurence Spates, MD 06/04/15 [phone]

## 2015-06-04 NOTE — ED Notes (Addendum)
Per EMS, pt states she was sitting in class, began tensing up, feels like she can't control her body. Pt appears to be tensed up in triage. No complaints of pain.  Per patient "I'm having a mental breakdown."

## 2015-06-04 NOTE — ED Notes (Signed)
Bed: [facility]3 Expected date:  Expected time:  Means of arrival:  Comments: Ems- "mental breakdown"

## 2015-06-04 NOTE — ED Notes (Signed)
Pt able to lay more relaxed in bed, not shaking, able to move neck more freely and speak more freely. But reports she is still a little stiff.

## 2015-06-04 NOTE — Discharge Instructions (Signed)
Please stop taking all medicines that are related serotonin. This includes your  Prozac and Haldol. Please call your psychiatrist in the morning to get change medications. Dystonia Dystonia is a condition that makes your muscles contract without warning (muscle spasms). It can make doing everyday tasks hard. There are different forms of dystonia. The condition can affect just one part of your body, or it can affect larger areas of your body. Dystonia affects people in different ways. In some people, it is mild and goes away over time, while others may need treatment. Although there is no cure for dystonia, you can manage the condition with treatment. CAUSES  Dystonia may be caused by:  Genetics. This means you inherited the genes that cause you to be at risk for dystonia.  An abnormality in the part of your brain that controls movement (basal ganglia). Dystonia may also be acquired. If you have acquired dystonia, you developed the condition after:  Brain injury.  Infection.  Drug reaction. The cause of dystonia may also not be known (idiopathic dystonia).  SIGNS AND SYMPTOMS Signs and symptoms of dystonia can depend on which type of the condition you have. Common signs and symptoms include:  Muscle twitches or spasms around your eyes (blepharospasm).  Foot cramping or dragging.  Pulling of your neck to one side (torticollis).  Muscles spasms of the face.  Spasms of the voice box.  Tremors.  Awkward and painful positions.  Muscle cramping after muscle use. DIAGNOSIS  Your health care provider can diagnose dystonia based on your symptoms and medical history. Your health care provider will also do a physical exam. You may also have:   A blood test to check for genes that cause dystonia.  Brain imaging tests to rule out other causes of your symptoms. There are no tests that can diagnose other causes of dystonia. TREATMENT  There are no treatments that can cure or prevent  dystonia. Treatment to manage dystonia may include:   Injecting the affected muscles with a chemical (botulinum) that blocks muscle spasms. This treatment can block spasms for a few days to a few months.  Medicines to relax muscles. HOME CARE INSTRUCTIONS  Physical therapy to improve muscle strength and movement may be suggested by your health care provider. Continue your physical therapy exercises at home as instructed by your physical therapist.  Make sure you have a good support system. Let your health care provider know if you are struggling with stress or anxiety.  Keep all follow-up visits as directed by your health care provider. This is important.  Take medicines only as directed by your health care provider. SEEK MEDICAL CARE IF:  Your condition is changing or getting worse.  You need more support at home.   This information is not intended to replace advice given to you by your health care provider. Make sure you discuss any questions you have with your health care provider.   Document Released: 06/25/2002 Document Revised: 07/26/2014 Document Reviewed: 08/29/2013 Elsevier Interactive Patient Education [employer]2016 Elsevier Inc.

## 2015-06-23 ENCOUNTER — Encounter: Payer: Self-pay | Admitting: Family Medicine

## 2015-09-11 ENCOUNTER — Encounter (HOSPITAL_BASED_OUTPATIENT_CLINIC_OR_DEPARTMENT_OTHER): Payer: Self-pay | Admitting: Emergency Medicine

## 2015-09-11 ENCOUNTER — Emergency Department (HOSPITAL_BASED_OUTPATIENT_CLINIC_OR_DEPARTMENT_OTHER)
Admission: EM | Admit: 2015-09-11 | Discharge: 2015-09-11 | Disposition: A | Discharge status: Home / Self Care | Payer: BLUE CROSS/BLUE SHIELD | Attending: Emergency Medicine | Admitting: Emergency Medicine

## 2015-09-11 DIAGNOSIS — K219 Gastro-esophageal reflux disease without esophagitis: Secondary | ICD-10-CM | POA: Insufficient documentation

## 2015-09-11 DIAGNOSIS — M25562 Pain in left knee: Secondary | ICD-10-CM | POA: Insufficient documentation

## 2015-09-11 DIAGNOSIS — M797 Fibromyalgia: Secondary | ICD-10-CM | POA: Diagnosis not present

## 2015-09-11 DIAGNOSIS — M2550 Pain in unspecified joint: Secondary | ICD-10-CM

## 2015-09-11 DIAGNOSIS — M62838 Other muscle spasm: Secondary | ICD-10-CM | POA: Diagnosis not present

## 2015-09-11 DIAGNOSIS — Z79899 Other long term (current) drug therapy: Secondary | ICD-10-CM | POA: Diagnosis not present

## 2015-09-11 DIAGNOSIS — F329 Major depressive disorder, single episode, unspecified: Secondary | ICD-10-CM | POA: Diagnosis not present

## 2015-09-11 DIAGNOSIS — M25572 Pain in left ankle and joints of left foot: Secondary | ICD-10-CM | POA: Insufficient documentation

## 2015-09-11 DIAGNOSIS — J45909 Unspecified asthma, uncomplicated: Secondary | ICD-10-CM | POA: Insufficient documentation

## 2015-09-11 DIAGNOSIS — M25571 Pain in right ankle and joints of right foot: Secondary | ICD-10-CM | POA: Insufficient documentation

## 2015-09-11 DIAGNOSIS — Z87891 Personal history of nicotine dependence: Secondary | ICD-10-CM | POA: Diagnosis not present

## 2015-09-11 DIAGNOSIS — M25561 Pain in right knee: Secondary | ICD-10-CM | POA: Insufficient documentation

## 2015-09-11 DIAGNOSIS — D649 Anemia, unspecified: Secondary | ICD-10-CM | POA: Diagnosis not present

## 2015-09-11 HISTORY — DX: Reserved for concepts with insufficient information to code with codable children: IMO0002

## 2015-09-11 HISTORY — DX: Unspecified osteoarthritis, unspecified site: M19.90

## 2015-09-11 HISTORY — DX: Gastro-esophageal reflux disease without esophagitis: K21.9

## 2015-09-11 HISTORY — DX: Systemic lupus erythematosus, unspecified: M32.9

## 2015-09-11 LAB — COMPREHENSIVE METABOLIC PANEL
ALT: 10 U/L — ABNORMAL LOW (ref 14–54)
ANION GAP: 6 (ref 5–15)
AST: 14 U/L — ABNORMAL LOW (ref 15–41)
Albumin: 3.6 g/dL (ref 3.5–5.0)
Alkaline Phosphatase: 45 U/L (ref 38–126)
BILIRUBIN TOTAL: 0.4 mg/dL (ref 0.3–1.2)
BUN: 11 mg/dL (ref 6–20)
CALCIUM: 9.2 mg/dL (ref 8.9–10.3)
CHLORIDE: 100 mmol/L — AB (ref 101–111)
CO2: 32 mmol/L (ref 22–32)
CREATININE: 0.8 mg/dL (ref 0.44–1.00)
GFR calc Af Amer: >60 mL/min (ref >60)
Glucose, Bld: 98 mg/dL (ref 65–99)
POTASSIUM: 3.4 mmol/L — AB (ref 3.5–5.1)
Sodium: 138 mmol/L (ref 135–145)
Total Protein: 6.6 g/dL (ref 6.5–8.1)

## 2015-09-11 LAB — CBC WITH DIFFERENTIAL/PLATELET
BASOS ABS: 0 10*3/uL (ref 0.0–0.1)
BASOS PCT: 0 %
EOS PCT: 0 %
Eosinophils Absolute: 0 10*3/uL (ref 0.0–0.7)
HEMATOCRIT: 29 % — AB (ref 36.0–46.0)
Hemoglobin: 9.1 g/dL — ABNORMAL LOW (ref 12.0–15.0)
LYMPHS PCT: 26 %
Lymphs Abs: 2.8 10*3/uL (ref 0.7–4.0)
MCH: 23.4 pg — ABNORMAL LOW (ref 26.0–34.0)
MCHC: 31.4 g/dL (ref 30.0–36.0)
MCV: 74.6 fL — AB (ref 78.0–100.0)
MONO ABS: 0.7 10*3/uL (ref 0.1–1.0)
MONOS PCT: 6 %
NEUTROS ABS: 7 10*3/uL (ref 1.7–7.7)
Neutrophils Relative %: 68 %
PLATELETS: 412 10*3/uL — AB (ref 150–400)
RBC: 3.89 MIL/uL (ref 3.87–5.11)
RDW: 15.3 % (ref 11.5–15.5)
WBC: 10.4 10*3/uL (ref 4.0–10.5)

## 2015-09-11 MED ORDER — HYDROCODONE-ACETAMINOPHEN 5-325 MG PO TABS
1.0000 | ORAL_TABLET | Freq: Once | ORAL | Status: AC
  Administered 2015-09-11: 1 via ORAL
  Filled 2015-09-11: qty 1

## 2015-09-11 MED ORDER — CYCLOBENZAPRINE HCL 10 MG PO TABS
10.0000 mg | ORAL_TABLET | Freq: Once | ORAL | Status: AC
  Administered 2015-09-11: 10 mg via ORAL
  Filled 2015-09-11: qty 1

## 2015-09-11 MED ORDER — HYDROCODONE-ACETAMINOPHEN 5-325 MG PO TABS: 1.0000 | ORAL_TABLET | Freq: Four times a day (QID) | ORAL | Status: DC | PRN

## 2015-09-11 MED ORDER — FERROUS SULFATE 325 (65 FE) MG PO TABS: 325.0000 mg | ORAL_TABLET | Freq: Every day | ORAL | Status: DC

## 2015-09-11 MED ORDER — CYCLOBENZAPRINE HCL 10 MG PO TABS: 10.0000 mg | ORAL_TABLET | Freq: Two times a day (BID) | ORAL | Status: DC | PRN

## 2015-09-11 MED FILL — CYCLOBENZAPRINE 10 MG TAB: 10 | 10 days supply | Qty: 20 | Fill #0

## 2015-09-11 MED FILL — HYDROCODON-APAP 5-325: 5-325 | 3 days supply | Qty: 20 | Fill #0

## 2015-09-11 MED FILL — FERROUS SULFATE 325 MG TAB: 325 (65 FE) | 100 days supply | Qty: 100 | Fill #0

## 2015-09-11 NOTE — ED Provider Notes (Addendum)
CSN: [id number]     Arrival date & time 09/11/15  0019 History   First MD Initiated Contact with Patient 09/11/15 0043     Chief Complaint  Patient presents with  . Knee Pain     (Consider location/radiation/quality/duration/timing/severity/associated sxs/prior Treatment) Patient is a 25 y.o. female presenting with knee pain. The history is provided by the patient.  Knee Pain Location:  Knee Time since incident: They've been hurting for months but worse in the last few days. Injury: no   Knee location:  L knee and R knee Pain details:    Quality:  Aching, pressure and throbbing   Radiates to:  Does not radiate   Severity:  Moderate   Onset quality:  Gradual   Timing:  Constant   Progression:  Waxing and waning Chronicity:  Chronic Relieved by:  Nothing Worsened by:  Activity Ineffective treatments: Prednisone, tramadol and magnesium. Associated symptoms: fatigue and stiffness   Associated symptoms: no decreased ROM, no fever, no muscle weakness, no numbness and no swelling   Associated symptoms comment:  Patient states it feels like her knees are loose and going to give out. She has a lot of pain above and below the knee as well as in bilateral feet and ankles. She was recently seen by her PCP [employer] and diagnosed with lupus or possibly rheumatoid arthritis. Patient has follow-up with her rheumatologist on the 28th   Past Medical History  Diagnosis Date  . Asthma   . Depressed   . Fibromyalgia   . PTSD (post-traumatic stress disorder)   . Opiate abuse, continuous   . Arthritis   . GERD (gastroesophageal reflux disease)   . Lupus [facility])    Past Surgical History  Procedure Laterality Date  . No past surgeries     No family history on file. Social History  Substance Use Topics  . Smoking status: Former Smoker -- 0.50 packs/day for 3 years    Types: Cigarettes    Quit date: 02/16/2014  . Smokeless tobacco: Current User  . Alcohol Use: 0.6 oz/week    1 Cans of  beer per week     Comment: Today pt denies any alcohol use and denies she ever smoked cigarettes or used drugs.   OB History    Gravida Para Term Preterm AB TAB SAB Ectopic Multiple Living       Review of Systems  Constitutional: Positive for fatigue. Negative for fever.  Musculoskeletal: Positive for stiffness.  All other systems reviewed and are negative.     Allergies  Bee venom  Home Medications   Prior to Admission medications   Medication Sig Start Date End Date Taking? Authorizing Provider  MAGNESIUM SULFATE PO Take by mouth.   Yes Historical Provider, MD  amoxicillin (AMOXIL) 500 MG capsule Take 1 capsule (500 mg total) by mouth 3 (three) times daily. Patient taking differently: Take 500 mg by mouth 3 (three) times daily. ABT Start Date 05/14/15 & End Date 05/23/15. 05/14/15   Janne Napoleon, NP  FLUoxetine (PROZAC) 10 MG capsule Take 10 mg by mouth daily.    Historical Provider, MD  promethazine (PHENERGAN) 25 MG tablet Take 0.5-1 tablets (12.5-25 mg total) by mouth every 6 (six) hours as needed. 03/08/15   Misty Stanley A Leftwich-Kirby, CNM  ranitidine (ZANTAC) 150 MG capsule Take 1 capsule (150 mg total) by mouth daily. Patient not taking: Reported on 05/15/2015 03/08/15   Hurshel Party,  CNM   BP 125/85 mmHg  Pulse 65  Temp(Src) 98.2 F (36.8 C) (Oral)  Resp 16  Ht  (1.676 m)  Wt 135 lb (61.236 kg)  BMI 21.80 kg/m2  SpO2 100%  LMP 09/01/2015 (Exact Date) Physical Exam  Constitutional: She is oriented to person, place, and time. She appears well-developed and well-nourished. No distress.  HENT:  Head: Normocephalic and atraumatic.  Mouth/Throat: Oropharynx is clear and moist.  Eyes: Conjunctivae and EOM are normal. Pupils are equal, round, and reactive to light.  Neck: Normal range of motion. Neck supple.  Cardiovascular: Normal rate, regular rhythm and intact distal pulses.   No murmur heard. Pulmonary/Chest: Effort normal and breath  sounds normal. No respiratory distress. She has no wheezes. She has no rales.  Abdominal: Soft. She exhibits no distension. There is no tenderness. There is no rebound and no guarding.  Musculoskeletal: Normal range of motion. She exhibits no edema or tenderness.       Legs: No joint swelling or erythema appreciated in the hands, wrists, shoulders, knees, ankles  Neurological: She is alert and oriented to person, place, and time.  Skin: Skin is warm and dry. No rash noted. No erythema.  Psychiatric: She has a normal mood and affect. Her behavior is normal.  Nursing note and vitals reviewed.   ED Course  Procedures (including critical care time) Labs Review Labs Reviewed  CBC WITH DIFFERENTIAL/PLATELET - Abnormal; Notable for the following:    Hemoglobin 9.1 (*)    HCT 29.0 (*)    MCV 74.6 (*)    MCH 23.4 (*)    Platelets 412 (*)    All other components within normal limits  COMPREHENSIVE METABOLIC PANEL - Abnormal; Notable for the following:    Potassium 3.4 (*)    Chloride 100 (*)    AST 14 (*)    ALT 10 (*)    All other components within normal limits    Imaging Review No results found. I have personally reviewed and evaluated these images and lab results as part of my medical decision-making.   EKG Interpretation None      MDM   Final diagnoses:  Joint pain  Muscle spasm  Anemia, unspecified anemia type    Patient is a 25 year old female here complaining of bilateral knee pain, tightness and feeling like they're getting give out those been ongoing for months but worse in the last few days. Patient was recently seen by her doctor and tested positive for lupus and rheumatoid arthritis. She has an appointment with the rheumatologist on the 28th. She was placed on tramadol and magnesium and prednisone last week when the test results came back positive however she has not had significant improvement in her symptoms. She complains of a lot of muscle spasms, generalized  fatigue and myalgias.  She is not aware of them checking her hemoglobin or electrolytes or renal function. Patient's exam is within normal limits except for some mild tenderness in her distal quadriceps. There is no evidence of joint swelling or irritation.  1:48 AM CBC showing mild anemia with a hemoglobin of 9 and recommended that patient start iron. CMP with normal renal function and a borderline potassium of 3.4. Encourage patient to drink orange juice any bananas but does not need to be on potassium therapy at this time.  Gwyneth Sprout, MD 09/11/15 9604  Gwyneth Sprout, MD 09/11/15 5409

## 2015-09-11 NOTE — ED Notes (Signed)
Myalgia, fatigue, and Joint pain for "a long time."  Tested last week at [employer] and diagnosed with Lupus and Rheumatoid arthritis.  Given a Prednisone pack and "magnesium".  Pt sts the pain is getting worse, especially in her knees and shoulders.

## 2015-09-11 NOTE — Discharge Instructions (Signed)
Anemia, Nonspecific Anemia is a condition in which the concentration of red blood cells or hemoglobin in the blood is below normal. Hemoglobin is a substance in red blood cells that carries oxygen to the tissues of the body. Anemia results in not enough oxygen reaching these tissues.  CAUSES  Common causes of anemia include:   Excessive bleeding. Bleeding may be internal or external. This includes excessive bleeding from periods (in women) or from the intestine.   Poor nutrition.   Chronic kidney, thyroid, and liver disease.  Bone marrow disorders that decrease red blood cell production.  Cancer and treatments for cancer.  HIV, AIDS, and their treatments.  Spleen problems that increase red blood cell destruction.  Blood disorders.  Excess destruction of red blood cells due to infection, medicines, and autoimmune disorders. SIGNS AND SYMPTOMS   Minor weakness.   Dizziness.   Headache.  Palpitations.   Shortness of breath, especially with exercise.   Paleness.  Cold sensitivity.  Indigestion.  Nausea.  Difficulty sleeping.  Difficulty concentrating. Symptoms may occur suddenly or they may develop slowly.  DIAGNOSIS  Additional blood tests are often needed. These help your health care provider determine the best treatment. Your health care provider will check your stool for blood and look for other causes of blood loss.  TREATMENT  Treatment varies depending on the cause of the anemia. Treatment can include:   Supplements of iron, vitamin B12, or folic acid.   Hormone medicines.   A blood transfusion. This may be needed if blood loss is severe.   Hospitalization. This may be needed if there is significant continual blood loss.   Dietary changes.  Spleen removal. HOME CARE INSTRUCTIONS Keep all follow-up appointments. It often takes many weeks to correct anemia, and having your health care provider check on your condition and your response to  treatment is very important. SEEK IMMEDIATE MEDICAL CARE IF:   You develop extreme weakness, shortness of breath, or chest pain.   You become dizzy or have trouble concentrating.  You develop heavy vaginal bleeding.   You develop a rash.   You have bloody or black, tarry stools.   You faint.   You vomit up blood.   You vomit repeatedly.   You have abdominal pain.  You have a fever or persistent symptoms for more than 2-3 days.   You have a fever and your symptoms suddenly get worse.   You are dehydrated.  MAKE SURE YOU:  Understand these instructions.  Will watch your condition.  Will get help right away if you are not doing well or get worse.   This information is not intended to replace advice given to you by your health care provider. Make sure you discuss any questions you have with your health care provider.   Document Released: 08/12/2004 Document Revised: 03/07/2013 Document Reviewed: 12/29/2012 Elsevier Interactive Patient Education 2016 Elsevier Inc.  

## 2015-09-25 DIAGNOSIS — M797 Fibromyalgia: Secondary | ICD-10-CM | POA: Insufficient documentation

## 2015-09-25 DIAGNOSIS — Z8719 Personal history of other diseases of the digestive system: Secondary | ICD-10-CM | POA: Insufficient documentation

## 2015-09-25 DIAGNOSIS — R079 Chest pain, unspecified: Secondary | ICD-10-CM | POA: Diagnosis not present

## 2015-09-25 DIAGNOSIS — Z79899 Other long term (current) drug therapy: Secondary | ICD-10-CM | POA: Diagnosis not present

## 2015-09-25 DIAGNOSIS — Z87891 Personal history of nicotine dependence: Secondary | ICD-10-CM | POA: Insufficient documentation

## 2015-09-25 DIAGNOSIS — F329 Major depressive disorder, single episode, unspecified: Secondary | ICD-10-CM | POA: Diagnosis not present

## 2015-09-25 DIAGNOSIS — J45909 Unspecified asthma, uncomplicated: Secondary | ICD-10-CM | POA: Diagnosis not present

## 2015-09-25 DIAGNOSIS — M1990 Unspecified osteoarthritis, unspecified site: Secondary | ICD-10-CM | POA: Insufficient documentation

## 2015-09-26 ENCOUNTER — Encounter (HOSPITAL_BASED_OUTPATIENT_CLINIC_OR_DEPARTMENT_OTHER): Payer: Self-pay

## 2015-09-26 ENCOUNTER — Emergency Department (HOSPITAL_BASED_OUTPATIENT_CLINIC_OR_DEPARTMENT_OTHER)
Admission: EM | Admit: 2015-09-26 | Discharge: 2015-09-26 | Disposition: A | Discharge status: Home / Self Care | Payer: BLUE CROSS/BLUE SHIELD | Attending: Emergency Medicine | Admitting: Emergency Medicine

## 2015-09-26 DIAGNOSIS — R079 Chest pain, unspecified: Secondary | ICD-10-CM

## 2015-09-26 MED ORDER — KETOROLAC TROMETHAMINE 60 MG/2ML IM SOLN
60.0000 mg | Freq: Once | INTRAMUSCULAR | Status: AC
  Administered 2015-09-26: 60 mg via INTRAMUSCULAR

## 2015-09-26 MED ORDER — KETOROLAC TROMETHAMINE 60 MG/2ML IM SOLN
INTRAMUSCULAR | Status: AC
  Filled 2015-09-26: qty 2

## 2015-09-26 NOTE — ED Notes (Addendum)
Pt c/o pain under left breast and mid sternal pain for the last two days that is worse with palpation.  Pt saw PCP Wednesday for the chest pain, [facility].  She states that the pain is the same kind of a pain and is not being helped by meds.

## 2015-09-26 NOTE — ED Provider Notes (Signed)
CSN: [id number]     Arrival date & time 09/25/15  2358 History   First MD Initiated Contact with Patient 09/26/15 0102     Chief Complaint  Patient presents with  . Pleurisy     (Consider location/radiation/quality/duration/timing/severity/associated sxs/prior Treatment) HPI   Molly Perkins is a 25 y.o. female with past medical history of fibromyalgia, lupus, PTSD, opiate abuse presenting today with chest pain. She states this has been going on for the past 2 days and began while she was on campus in college. The pain is sharp and substernal. It radiates to her bilateral breasts. She was in the emergency department at [facility] where they ran test and sent her home with tramadol. She states the tramadol does not help with her pain. Her primary care physician recommended she come back to emergency department if she's continued to having pain. She denies shortness of breath. She has no long distance travel or recent surgeries. She denies estrogen use or birth control. Patient has never had a blood clot before. There are no further complaints.   10 Systems reviewed and are negative for acute change except as noted in the HPI.   Past Medical History  Diagnosis Date  . Asthma   . Depressed   . Fibromyalgia   . PTSD (post-traumatic stress disorder)   . Opiate abuse, continuous   . Arthritis   . GERD (gastroesophageal reflux disease)   . Lupus [facility])    Past Surgical History  Procedure Laterality Date  . No past surgeries     No family history on file. Social History  Substance Use Topics  . Smoking status: Former Smoker -- 0.50 packs/day for 3 years    Types: Cigarettes    Quit date: 02/16/2014  . Smokeless tobacco: Current User  . Alcohol Use: 0.6 oz/week    1 Cans of beer per week     Comment: Today pt denies any alcohol use and denies she ever smoked cigarettes or used drugs.   OB History    Gravida Para Term Preterm AB TAB SAB Ectopic Multiple Living   0 0 0 0 0 0 0  0 0 0     Review of Systems    Allergies  Bee venom  Home Medications   Prior to Admission medications   Medication Sig Start Date End Date Taking? Authorizing Provider  amoxicillin (AMOXIL) 500 MG capsule Take 1 capsule (500 mg total) by mouth 3 (three) times daily. Patient taking differently: Take 500 mg by mouth 3 (three) times daily. ABT Start Date 05/14/15 & End Date 05/23/15. 05/14/15   Janne Napoleon, NP  cyclobenzaprine (FLEXERIL) 10 MG tablet Take 1 tablet (10 mg total) by mouth 2 (two) times daily as needed for muscle spasms. 09/11/15   Gwyneth Sprout, MD  ferrous sulfate 325 (65 FE) MG tablet Take 1 tablet (325 mg total) by mouth daily. 09/11/15   Gwyneth Sprout, MD  FLUoxetine (PROZAC) 10 MG capsule Take 10 mg by mouth daily.    Historical Provider, MD  HYDROcodone-acetaminophen (NORCO/VICODIN) 5-325 MG tablet Take 1-2 tablets by mouth every 6 (six) hours as needed. 09/11/15   Gwyneth Sprout, MD  MAGNESIUM SULFATE PO Take by mouth.    Historical Provider, MD  promethazine (PHENERGAN) 25 MG tablet Take 0.5-1 tablets (12.5-25 mg total) by mouth every 6 (six) hours as needed. 03/08/15   Misty Stanley A Leftwich-Kirby, CNM  ranitidine (ZANTAC) 150 MG capsule Take 1 capsule (150 mg total) by mouth  daily. Patient not taking: Reported on 05/15/2015 03/08/15   Misty StanleyLisa A Leftwich-Kirby, CNM   BP 115/84 mmHg  Pulse 94  Temp(Src) 98.8 F (37.1 C) (Oral)  Resp 18  Ht 5\' 6"  (1.676 m)  Wt 128 lb (58.06 kg)  BMI 20.67 kg/m2  SpO2 100%  LMP 09/01/2015 (Exact Date) Physical Exam  Constitutional: She is oriented to person, place, and time. She appears well-developed and well-nourished. No distress.  HENT:  Head: Normocephalic and atraumatic.  Nose: Nose normal.  Mouth/Throat: Oropharynx is clear and moist. No oropharyngeal exudate.  Eyes: Conjunctivae and EOM are normal. Pupils are equal, round, and reactive to light. No scleral icterus.  Neck: Normal range of motion. Neck supple. No JVD  present. No tracheal deviation present. No thyromegaly present.  Cardiovascular: Normal rate, regular rhythm and normal heart sounds.  Exam reveals no gallop and no friction rub.   No murmur heard. Pulmonary/Chest: Effort normal and breath sounds normal. No respiratory distress. She has no wheezes. She exhibits no tenderness.  Abdominal: Soft. Bowel sounds are normal. She exhibits no distension and no mass. There is no tenderness. There is no rebound and no guarding.  Musculoskeletal: Normal range of motion. She exhibits no edema or tenderness.  Lymphadenopathy:    She has no cervical adenopathy.  Neurological: She is alert and oriented to person, place, and time. No cranial nerve deficit. She exhibits normal muscle tone.  Skin: Skin is warm and dry. No rash noted. No erythema. No pallor.  Nursing note and vitals reviewed.   ED Course  Procedures (including critical care time) Labs Review Labs Reviewed - No data to display  Imaging Review No results found. I have personally reviewed and evaluated these images and lab results as part of my medical decision-making.   EKG Interpretation   Date/Time:  Friday September 26 2015 01:39:03 EST Ventricular Rate:  94 PR Interval:  164 QRS Duration: 67 QT Interval:  348 QTC Calculation: 435 R Axis:   78 Text Interpretation:  Sinus rhythm ST elev, probable normal early repol  pattern tachycardia now resolved Confirmed by Erroll Lunani, Sabrina Keough Ayokunle  [phone]([id number]) on 09/26/2015 1:51:50 AM      MDM   Final diagnoses:  None    Patient presents to the ED for evaluation of her chest pain.  She was seen at [facility] and upon chart review the ED did a cardiac work up with troponin, EKG and CXR.  They were all normal.  Patient is refusing repeat CXR because it was just performed.  EKG is unchanged from previous. She is PERC negative.  She states the tramadol is not working and is requesting hydrocodone.  Patient was advised to continue ibuprofen or  tylenol at home and use tramadol for breakthrough pain.  I did not give her any stronger narcotics.  She was advised to see her PCP for possible echo given her history of lupus.  She appears well and in NAD.  VS remain within her normal limits and she is safe for DC   Tomasita CrumbleAdeleke Chanetta Moosman, MD 09/26/15 [id number]

## 2015-09-26 NOTE — Discharge Instructions (Signed)
Nonspecific Chest Pain Molly Perkins, your EKG today was normal.  See your primary care doctor within3  Days for your chest pain.  Take ibuprofen or tylenol at home for pain.  If any symptoms worsen, come back to the ED immediately. Thank you. It is often hard to find the cause of chest pain. There is always a chance that your pain could be related to something serious, such as a heart attack or a blood clot in your lungs. Chest pain can also be caused by conditions that are not life-threatening. If you have chest pain, it is very important to follow up with your doctor.  HOME CARE  If you were prescribed an antibiotic medicine, finish it all even if you start to feel better.  Avoid any activities that cause chest pain.  Do not use any tobacco products, including cigarettes, chewing tobacco, or electronic cigarettes. If you need help quitting, ask your doctor.  Do not drink alcohol.  Take medicines only as told by your doctor.  Keep all follow-up visits as told by your doctor. This is important. This includes any further testing if your chest pain does not go away.  Your doctor may tell you to keep your head raised (elevated) while you sleep.  Make lifestyle changes as told by your doctor. These may include:  Getting regular exercise. Ask your doctor to suggest some activities that are safe for you.  Eating a heart-healthy diet. Your doctor or a diet specialist (dietitian) can help you to learn healthy eating options.  Maintaining a healthy weight.  Managing diabetes, if necessary.  Reducing stress. GET HELP IF:  Your chest pain does not go away, even after treatment.  You have a rash with blisters on your chest.  You have a fever. GET HELP RIGHT AWAY IF:  Your chest pain is worse.  You have an increasing cough, or you cough up blood.  You have severe belly (abdominal) pain.  You feel extremely weak.  You pass out (faint).  You have chills.  You have sudden,  unexplained chest discomfort.  You have sudden, unexplained discomfort in your arms, back, neck, or jaw.  You have shortness of breath at any time.  You suddenly start to sweat, or your skin gets clammy.  You feel nauseous.  You vomit.  You suddenly feel light-headed or dizzy.  Your heart begins to beat quickly, or it feels like it is skipping beats. These symptoms may be an emergency. Do not wait to see if the symptoms will go away. Get medical help right away. Call your local emergency services (911 in the [place] Do not drive yourself to the hospital.   This information is not intended to replace advice given to you by your health care provider. Make sure you discuss any questions you have with your health care provider.   Document Released: 12/22/2007 Document Revised: 07/26/2014 Document Reviewed: 02/08/2014 Elsevier Interactive Patient Education [employer]2016 Elsevier Inc.

## 2015-10-03 MED FILL — HYDROCODON-APAP 5-325: 5-325 | 5 days supply | Qty: 20 | Fill #0

## 2015-10-26 ENCOUNTER — Emergency Department (HOSPITAL_BASED_OUTPATIENT_CLINIC_OR_DEPARTMENT_OTHER)
Admission: EM | Admit: 2015-10-26 | Discharge: 2015-10-27 | Disposition: A | Discharge status: Home / Self Care | Payer: BLUE CROSS/BLUE SHIELD | Attending: Emergency Medicine | Admitting: Emergency Medicine

## 2015-10-26 ENCOUNTER — Encounter (HOSPITAL_BASED_OUTPATIENT_CLINIC_OR_DEPARTMENT_OTHER): Payer: Self-pay | Admitting: *Deleted

## 2015-10-26 ENCOUNTER — Emergency Department (HOSPITAL_BASED_OUTPATIENT_CLINIC_OR_DEPARTMENT_OTHER): Payer: BLUE CROSS/BLUE SHIELD

## 2015-10-26 DIAGNOSIS — G8929 Other chronic pain: Secondary | ICD-10-CM | POA: Diagnosis not present

## 2015-10-26 DIAGNOSIS — F329 Major depressive disorder, single episode, unspecified: Secondary | ICD-10-CM | POA: Insufficient documentation

## 2015-10-26 DIAGNOSIS — G478 Other sleep disorders: Secondary | ICD-10-CM | POA: Diagnosis not present

## 2015-10-26 DIAGNOSIS — M797 Fibromyalgia: Secondary | ICD-10-CM | POA: Insufficient documentation

## 2015-10-26 DIAGNOSIS — Z79899 Other long term (current) drug therapy: Secondary | ICD-10-CM | POA: Diagnosis not present

## 2015-10-26 DIAGNOSIS — F4310 Post-traumatic stress disorder, unspecified: Secondary | ICD-10-CM | POA: Diagnosis not present

## 2015-10-26 DIAGNOSIS — M1990 Unspecified osteoarthritis, unspecified site: Secondary | ICD-10-CM | POA: Insufficient documentation

## 2015-10-26 DIAGNOSIS — Z3202 Encounter for pregnancy test, result negative: Secondary | ICD-10-CM | POA: Diagnosis not present

## 2015-10-26 DIAGNOSIS — K219 Gastro-esophageal reflux disease without esophagitis: Secondary | ICD-10-CM | POA: Diagnosis not present

## 2015-10-26 DIAGNOSIS — Z87891 Personal history of nicotine dependence: Secondary | ICD-10-CM | POA: Insufficient documentation

## 2015-10-26 DIAGNOSIS — J45909 Unspecified asthma, uncomplicated: Secondary | ICD-10-CM | POA: Diagnosis not present

## 2015-10-26 DIAGNOSIS — M545 Low back pain: Secondary | ICD-10-CM | POA: Diagnosis not present

## 2015-10-26 DIAGNOSIS — R0789 Other chest pain: Secondary | ICD-10-CM | POA: Diagnosis not present

## 2015-10-26 DIAGNOSIS — Z792 Long term (current) use of antibiotics: Secondary | ICD-10-CM | POA: Insufficient documentation

## 2015-10-26 DIAGNOSIS — R079 Chest pain, unspecified: Secondary | ICD-10-CM | POA: Diagnosis present

## 2015-10-26 DIAGNOSIS — R42 Dizziness and giddiness: Secondary | ICD-10-CM | POA: Insufficient documentation

## 2015-10-26 LAB — COMPREHENSIVE METABOLIC PANEL
ALT: 11 U/L — AB (ref 14–54)
AST: 20 U/L (ref 15–41)
Albumin: 3.7 g/dL (ref 3.5–5.0)
Alkaline Phosphatase: 44 U/L (ref 38–126)
Anion gap: 6 (ref 5–15)
BILIRUBIN TOTAL: 0.3 mg/dL (ref 0.3–1.2)
BUN: 12 mg/dL (ref 6–20)
CHLORIDE: 104 mmol/L (ref 101–111)
CO2: 26 mmol/L (ref 22–32)
CREATININE: 0.8 mg/dL (ref 0.44–1.00)
Calcium: 9.2 mg/dL (ref 8.9–10.3)
Glucose, Bld: 108 mg/dL — ABNORMAL HIGH (ref 65–99)
Potassium: 4 mmol/L (ref 3.5–5.1)
Sodium: 136 mmol/L (ref 135–145)
TOTAL PROTEIN: 6.9 g/dL (ref 6.5–8.1)

## 2015-10-26 LAB — TROPONIN I

## 2015-10-26 LAB — CBC WITH DIFFERENTIAL/PLATELET
Basophils Absolute: 0 10*3/uL (ref 0.0–0.1)
Basophils Relative: 1 %
EOS PCT: 11 %
Eosinophils Absolute: 1 10*3/uL — ABNORMAL HIGH (ref 0.0–0.7)
HCT: 27.4 % — ABNORMAL LOW (ref 36.0–46.0)
Hemoglobin: 8.3 g/dL — ABNORMAL LOW (ref 12.0–15.0)
Lymphocytes Relative: 33 %
Lymphs Abs: 2.9 10*3/uL (ref 0.7–4.0)
MCH: 22.9 pg — AB (ref 26.0–34.0)
MCHC: 30.3 g/dL (ref 30.0–36.0)
MCV: 75.7 fL — AB (ref 78.0–100.0)
MONO ABS: 0.4 10*3/uL (ref 0.1–1.0)
MONOS PCT: 5 %
Neutro Abs: 4.4 10*3/uL (ref 1.7–7.7)
Neutrophils Relative %: 50 %
PLATELETS: 401 10*3/uL — AB (ref 150–400)
RBC: 3.62 MIL/uL — AB (ref 3.87–5.11)
RDW: 17.6 % — AB (ref 11.5–15.5)
WBC: 8.8 10*3/uL (ref 4.0–10.5)

## 2015-10-26 MED ORDER — KETOROLAC TROMETHAMINE 30 MG/ML IJ SOLN
30.0000 mg | Freq: Once | INTRAMUSCULAR | Status: AC
  Administered 2015-10-26: 30 mg via INTRAVENOUS
  Filled 2015-10-26: qty 1

## 2015-10-26 MED ORDER — METHOCARBAMOL 500 MG PO TABS
1000.0000 mg | ORAL_TABLET | Freq: Once | ORAL | Status: AC
  Administered 2015-10-26: 1000 mg via ORAL
  Filled 2015-10-26: qty 2

## 2015-10-26 NOTE — ED Notes (Signed)
C/o CP, worse with breathing, also back pain, nv, dizziness, h/o lupus.

## 2015-10-26 NOTE — ED Provider Notes (Signed)
CSN: [id number]     Arrival date & time 10/26/15  2109 History  By signing my name below, I, [facility], attest that this documentation has been prepared under the direction and in the presence of Graeden Bitner, MD. Electronically Signed: Randell Patient, ED Scribe. 10/26/2015. 11:19 PM.   Chief Complaint  Patient presents with  . Chest Pain   Patient is a 25 y.o. female presenting with chest pain. The history is provided by the patient. No language interpreter was used.  Chest Pain Pain location:  Substernal area Pain quality: sharp   Pain radiates to:  Does not radiate Pain severity:  Moderate Onset quality:  Gradual Timing:  Constant Progression:  Unchanged Chronicity:  Chronic Context: no trauma   Relieved by:  Nothing Worsened by:  Nothing tried Associated symptoms: back pain and dizziness   Associated symptoms: no cough, no fever and no shortness of breath   Risk factors: no aortic disease, not obese and no prior DVT/PE   HPI Comments: [place] C Brunn is a 25 y.o. female with an hx of lupus and fibromyalgia who presents to the Emergency Department complaining of constant, mild CP ongoing for the past 2 months, worse today. Patient reports that sleep disturbance, and intermittent dizziness. She has taken OTC pain medication without relief. She has seen her PCP who scheduled an appointment with a rheumatologist in a couple of weeks. She has had an EKG and ct angio at [facility]. Denies recent travel. Denies cough and congestion.  States she is being referred to rheumatology for her ongoing symptoms  Past Medical History  Diagnosis Date  . Asthma   . Depressed   . Fibromyalgia   . PTSD (post-traumatic stress disorder)   . Opiate abuse, continuous   . Arthritis   . GERD (gastroesophageal reflux disease)   . Lupus [facility])    Past Surgical History  Procedure Laterality Date  . No past surgeries     History reviewed. No pertinent family history. Social  History  Substance Use Topics  . Smoking status: Former Smoker -- 0.50 packs/day for 3 years    Types: Cigarettes    Quit date: 02/16/2014  . Smokeless tobacco: Current User  . Alcohol Use: 0.6 oz/week    1 Cans of beer per week     Comment: Today pt denies any alcohol use and denies she ever smoked cigarettes or used drugs.   OB History    Gravida Para Term Preterm AB TAB SAB Ectopic Multiple Living   0 0 0 0 0 0 0 0 0 0      Review of Systems  Constitutional: Negative for fever.  HENT: Negative for congestion.   Respiratory: Negative for cough and shortness of breath.   Cardiovascular: Positive for chest pain. Negative for leg swelling.  Musculoskeletal: Positive for back pain.  Neurological: Positive for dizziness.  Psychiatric/Behavioral: Positive for sleep disturbance.  All other systems reviewed and are negative.   Allergies  Bee venom  Home Medications   Prior to Admission medications   Medication Sig Start Date End Date Taking? Authorizing Provider  amoxicillin (AMOXIL) 500 MG capsule Take 1 capsule (500 mg total) by mouth 3 (three) times daily. Patient taking differently: Take 500 mg by mouth 3 (three) times daily. ABT Start Date 05/14/15 & End Date 05/23/15. 05/14/15   Janne Napoleon, NP  cyclobenzaprine (FLEXERIL) 10 MG tablet Take 1 tablet (10 mg total) by mouth 2 (two) times daily as needed for muscle spasms.  09/11/15   Gwyneth Sprout, MD  ferrous sulfate 325 (65 FE) MG tablet Take 1 tablet (325 mg total) by mouth daily. 09/11/15   Gwyneth Sprout, MD  FLUoxetine (PROZAC) 10 MG capsule Take 10 mg by mouth daily.    Historical Provider, MD  HYDROcodone-acetaminophen (NORCO/VICODIN) 5-325 MG tablet Take 1-2 tablets by mouth every 6 (six) hours as needed. 09/11/15   Gwyneth Sprout, MD  MAGNESIUM SULFATE PO Take by mouth.    Historical Provider, MD  promethazine (PHENERGAN) 25 MG tablet Take 0.5-1 tablets (12.5-25 mg total) by mouth every 6 (six) hours as needed.  03/08/15   Misty Stanley A Leftwich-Kirby, CNM  ranitidine (ZANTAC) 150 MG capsule Take 1 capsule (150 mg total) by mouth daily. Patient not taking: Reported on 05/15/2015 03/08/15   Misty Stanley A Leftwich-Kirby, CNM   BP 116/75 mmHg  Pulse 87  Temp(Src) 97.7 F (36.5 C) (Oral)  Resp 15  Ht  (1.676 m)  Wt 125 lb (56.7 kg)  BMI 20.19 kg/m2  SpO2 100%  LMP 10/06/2015 (Approximate) Physical Exam  Constitutional: She is oriented to person, place, and time. She appears well-developed and well-nourished. No distress.  HENT:  Head: Normocephalic and atraumatic.  Mouth/Throat: Oropharynx is clear and moist and mucous membranes are normal. Mucous membranes are not dry.  Eyes: Conjunctivae are normal. Pupils are equal, round, and reactive to light.  Neck: Normal range of motion.  Cardiovascular: Normal rate, regular rhythm and normal heart sounds.  Exam reveals no gallop and no friction rub.   No murmur heard. Pulmonary/Chest: Effort normal. No respiratory distress. She has no wheezes. She has no rales.  Lungs CTA bilaterally.  Abdominal: Soft. Bowel sounds are normal. She exhibits no distension and no mass. There is no tenderness. There is no rebound and no guarding.  Musculoskeletal: Normal range of motion. She exhibits no edema or tenderness.  Neurological: She is alert and oriented to person, place, and time. She has normal reflexes.  Skin: Skin is warm and dry.  Psychiatric: She has a normal mood and affect. Her behavior is normal.  Nursing note and vitals reviewed.   ED Course  Procedures   DIAGNOSTIC STUDIES: Oxygen Saturation is 100% on RA, normal by my interpretation.    COORDINATION OF CARE: 11:07 PM Will order labs, medications. Discussed treatment plan with pt at bedside and pt agreed to plan.   Labs Review Labs Reviewed  CBC WITH DIFFERENTIAL/PLATELET - Abnormal; Notable for the following:    RBC 3.62 (*)    Hemoglobin 8.3 (*)    HCT 27.4 (*)    MCV 75.7 (*)    MCH 22.9 (*)     RDW 17.6 (*)    Platelets 401 (*)    Eosinophils Absolute 1.0 (*)    All other components within normal limits  COMPREHENSIVE METABOLIC PANEL - Abnormal; Notable for the following:    Glucose, Bld 108 (*)    ALT 11 (*)    All other components within normal limits  TROPONIN I  PREGNANCY, URINE    Imaging Review Dg Chest 2 View  10/26/2015  CLINICAL DATA:  Chest pain, pleuritic. EXAM: CHEST  2 VIEW COMPARISON:  09/24/2015 FINDINGS: The heart size and mediastinal contours are within normal limits. Both lungs are clear. The visualized skeletal structures are unremarkable. IMPRESSION: No active cardiopulmonary disease. Electronically Signed   By: Ellery Plunk M.D.   On: 10/26/2015 23:10   I have personally reviewed and evaluated these images and lab results as  part of my medical decision-making.   EKG Interpretation   Date/Time:  Sunday Parilee Hally 09 2017 21:18:22 EDT Ventricular Rate:  102 PR Interval:  150 QRS Duration: 76 QT Interval:  332 QTC Calculation: 432 R Axis:   64 Text Interpretation:  Sinus tachycardia Confirmed by [facility]  MD,  Imanol Bihl ([id number]) on 10/26/2015 11:14:45 PM      MDM   Final diagnoses:  None   Results for orders placed or performed during the hospital encounter of 10/26/15  CBC with Differential  Result Value Ref Range   WBC 8.8 4.0 - 10.5 K/uL   RBC 3.62 (L) 3.87 - 5.11 MIL/uL   Hemoglobin 8.3 (L) 12.0 - 15.0 g/dL   HCT 60.4 (L) 54.0 - 98.1 %   MCV 75.7 (L) 78.0 - 100.0 fL   MCH 22.9 (L) 26.0 - 34.0 pg   MCHC 30.3 30.0 - 36.0 g/dL   RDW 19.1 (H) 47.8 - 29.5 %   Platelets 401 (H) 150 - 400 K/uL   Neutrophils Relative % 50 %   Neutro Abs 4.4 1.7 - 7.7 K/uL   Lymphocytes Relative 33 %   Lymphs Abs 2.9 0.7 - 4.0 K/uL   Monocytes Relative 5 %   Monocytes Absolute 0.4 0.1 - 1.0 K/uL   Eosinophils Relative 11 %   Eosinophils Absolute 1.0 (H) 0.0 - 0.7 K/uL   Basophils Relative 1 %   Basophils Absolute 0.0 0.0 - 0.1 K/uL  Comprehensive  metabolic panel  Result Value Ref Range   Sodium 136 135 - 145 mmol/L   Potassium 4.0 3.5 - 5.1 mmol/L   Chloride 104 101 - 111 mmol/L   CO2 26 22 - 32 mmol/L   Glucose, Bld 108 (H) 65 - 99 mg/dL   BUN 12 6 - 20 mg/dL   Creatinine, Ser 6.21 0.44 - 1.00 mg/dL   Calcium 9.2 8.9 - 30.8 mg/dL   Total Protein 6.9 6.5 - 8.1 g/dL   Albumin 3.7 3.5 - 5.0 g/dL   AST 20 15 - 41 U/L   ALT 11 (L) 14 - 54 U/L   Alkaline Phosphatase 44 38 - 126 U/L   Total Bilirubin 0.3 0.3 - 1.2 mg/dL   GFR calc non Af Amer >60 >60 mL/min   GFR calc Af Amer >60 >60 mL/min   Anion gap 6 5 - 15  Troponin I  Result Value Ref Range   Troponin I <0.03 <0.031 ng/mL  Pregnancy, urine  Result Value Ref Range   Preg Test, Ur NEGATIVE NEGATIVE  D-dimer, quantitative (not at [facility])  Result Value Ref Range   D-Dimer, Quant <0.27 0.00 - 0.50 ug/mL-FEU   Dg Chest 2 View  10/26/2015  CLINICAL DATA:  Chest pain, pleuritic. EXAM: CHEST  2 VIEW COMPARISON:  09/24/2015 FINDINGS: The heart size and mediastinal contours are within normal limits. Both lungs are clear. The visualized skeletal structures are unremarkable. IMPRESSION: No active cardiopulmonary disease. Electronically Signed   By: Ellery Plunk M.D.   On: 10/26/2015 23:10    Medications  haloperidol lactate (HALDOL) injection 2 mg (not administered)  ketorolac (TORADOL) 30 MG/ML injection 30 mg (30 mg Intravenous Given 10/26/15 2319)  methocarbamol (ROBAXIN) tablet 1,000 mg (1,000 mg Oral Given 10/26/15 2318)   As the pain is chronic have added haldol to the toradol and robaxin   PERC negative. Wells 0. Highly doubt PE. Recent angio of chest negative. Ddimer also negative an extremely low risk patient.  Have reviewed outside records  from PMD and [facility].  Symptoms believed to be related to ongoing fibromyalgia. Patient is extremely well appearing.  Patient wants refills of her tramadol and EDP explained we do not refill narcotic pain medication.  I also do not believe  this to be in the patient's best interest given opioid abuse.  Patient has been worked up for cardiac and pulmonary issues extensively but no source has been found at multiple institutions.  Highly doubt cardiac condition in a young patient.  Will treat with NSAIDs.  Follow up with your PMD for recheck.  Strict return precautions given.    I personally performed the services described in this documentation, which was scribed in my presence. The recorded information has been reviewed and is accurate.      Cy BlamerApril Floye Fesler, MD 10/27/15 [phone]

## 2015-10-27 ENCOUNTER — Encounter (HOSPITAL_BASED_OUTPATIENT_CLINIC_OR_DEPARTMENT_OTHER): Payer: Self-pay | Admitting: Emergency Medicine

## 2015-10-27 LAB — PREGNANCY, URINE: PREG TEST UR: NEGATIVE

## 2015-10-27 LAB — D-DIMER, QUANTITATIVE (NOT AT ARMC)

## 2015-10-27 MED ORDER — HALOPERIDOL LACTATE 5 MG/ML IJ SOLN
2.0000 mg | Freq: Once | INTRAMUSCULAR | Status: AC
  Administered 2015-10-27: 2 mg via INTRAVENOUS
  Filled 2015-10-27: qty 1

## 2015-10-27 MED ORDER — MELOXICAM 15 MG PO TABS: 15.0000 mg | ORAL_TABLET | Freq: Every day | ORAL | Status: DC

## 2015-10-27 NOTE — ED Notes (Signed)
Declines w/c at d/c, steady gait

## 2015-10-27 NOTE — Discharge Instructions (Signed)

## 2015-10-27 NOTE — ED Notes (Signed)
MD currently at bedside explaining policy on narcotic refills. Pt presented a urine specimen cup that had clear fluid in it, stating it was her urine; MD explained the need for an in and out catheterization for her urine specimen. Pt agrees to plan of care.

## 2015-11-04 ENCOUNTER — Other Ambulatory Visit: Payer: Self-pay | Admitting: Rheumatology

## 2015-11-04 DIAGNOSIS — R112 Nausea with vomiting, unspecified: Secondary | ICD-10-CM

## 2015-11-04 DIAGNOSIS — R10811 Right upper quadrant abdominal tenderness: Secondary | ICD-10-CM

## 2015-11-18 ENCOUNTER — Inpatient Hospital Stay: Admission: RE | Admit: 2015-11-18 | Payer: Self-pay | Source: Ambulatory Visit

## 2015-11-21 ENCOUNTER — Emergency Department (HOSPITAL_COMMUNITY)
Admission: EM | Admit: 2015-11-21 | Discharge: 2015-11-21 | Disposition: A | Discharge status: Home / Self Care | Payer: BLUE CROSS/BLUE SHIELD | Attending: Emergency Medicine | Admitting: Emergency Medicine

## 2015-11-21 ENCOUNTER — Emergency Department (HOSPITAL_COMMUNITY): Payer: BLUE CROSS/BLUE SHIELD

## 2015-11-21 ENCOUNTER — Encounter (HOSPITAL_COMMUNITY): Payer: Self-pay | Admitting: Emergency Medicine

## 2015-11-21 DIAGNOSIS — M329 Systemic lupus erythematosus, unspecified: Secondary | ICD-10-CM | POA: Insufficient documentation

## 2015-11-21 DIAGNOSIS — M1990 Unspecified osteoarthritis, unspecified site: Secondary | ICD-10-CM | POA: Diagnosis not present

## 2015-11-21 DIAGNOSIS — R12 Heartburn: Secondary | ICD-10-CM | POA: Insufficient documentation

## 2015-11-21 DIAGNOSIS — Z87891 Personal history of nicotine dependence: Secondary | ICD-10-CM | POA: Diagnosis not present

## 2015-11-21 DIAGNOSIS — J45901 Unspecified asthma with (acute) exacerbation: Secondary | ICD-10-CM | POA: Insufficient documentation

## 2015-11-21 DIAGNOSIS — R079 Chest pain, unspecified: Secondary | ICD-10-CM | POA: Diagnosis present

## 2015-11-21 DIAGNOSIS — F329 Major depressive disorder, single episode, unspecified: Secondary | ICD-10-CM | POA: Insufficient documentation

## 2015-11-21 DIAGNOSIS — D6859 Other primary thrombophilia: Secondary | ICD-10-CM | POA: Diagnosis not present

## 2015-11-21 DIAGNOSIS — K219 Gastro-esophageal reflux disease without esophagitis: Secondary | ICD-10-CM | POA: Insufficient documentation

## 2015-11-21 DIAGNOSIS — I319 Disease of pericardium, unspecified: Secondary | ICD-10-CM | POA: Diagnosis not present

## 2015-11-21 DIAGNOSIS — M797 Fibromyalgia: Secondary | ICD-10-CM | POA: Diagnosis not present

## 2015-11-21 DIAGNOSIS — Z79899 Other long term (current) drug therapy: Secondary | ICD-10-CM | POA: Diagnosis not present

## 2015-11-21 LAB — TROPONIN I

## 2015-11-21 LAB — CBC
HCT: 29.3 % — ABNORMAL LOW (ref 36.0–46.0)
HEMOGLOBIN: 8.8 g/dL — AB (ref 12.0–15.0)
MCH: 22.1 pg — ABNORMAL LOW (ref 26.0–34.0)
MCHC: 30 g/dL (ref 30.0–36.0)
MCV: 73.4 fL — AB (ref 78.0–100.0)
PLATELETS: 921 10*3/uL — AB (ref 150–400)
RBC: 3.99 MIL/uL (ref 3.87–5.11)
RDW: 17.3 % — ABNORMAL HIGH (ref 11.5–15.5)
WBC: 15.7 10*3/uL — AB (ref 4.0–10.5)

## 2015-11-21 LAB — BASIC METABOLIC PANEL
ANION GAP: 11 (ref 5–15)
BUN: 9 mg/dL (ref 6–20)
CHLORIDE: 102 mmol/L (ref 101–111)
CO2: 25 mmol/L (ref 22–32)
CREATININE: 0.82 mg/dL (ref 0.44–1.00)
Calcium: 9.6 mg/dL (ref 8.9–10.3)
GFR calc non Af Amer: >60 mL/min (ref >60)
Glucose, Bld: 111 mg/dL — ABNORMAL HIGH (ref 65–99)
Potassium: 4.3 mmol/L (ref 3.5–5.1)
SODIUM: 138 mmol/L (ref 135–145)

## 2015-11-21 LAB — I-STAT TROPONIN, ED: TROPONIN I, POC: 0 ng/mL (ref 0.00–0.08)

## 2015-11-21 MED ORDER — RANITIDINE HCL 150 MG PO TABS
150.0000 mg | ORAL_TABLET | Freq: Two times a day (BID) | ORAL | Status: DC
Start: 2015-11-21 — End: 2016-08-11

## 2015-11-21 MED ORDER — KETOROLAC TROMETHAMINE 15 MG/ML IJ SOLN
15.0000 mg | Freq: Once | INTRAMUSCULAR | Status: AC
  Administered 2015-11-21: 15 mg via INTRAVENOUS
  Filled 2015-11-21: qty 1

## 2015-11-21 MED ORDER — ASPIRIN 81 MG PO CHEW: 324.0000 mg | CHEWABLE_TABLET | Freq: Every day | ORAL | Status: AC

## 2015-11-21 MED ORDER — GI COCKTAIL ~~LOC~~
30.0000 mL | Freq: Once | ORAL | Status: AC
  Administered 2015-11-21: 30 mL via ORAL
  Filled 2015-11-21: qty 30

## 2015-11-21 NOTE — ED Notes (Signed)
Critical Platelets given to EDP [place] 921

## 2015-11-21 NOTE — ED Notes (Signed)
Patient comes from home states has Hx of Pericarditis and feel the same states Chest pain center of chest no radiating. Tender on palpation. Patient has hx. Of lupus states took pain medication and did not work. Patient states pain increase with lying.

## 2015-11-21 NOTE — ED Provider Notes (Signed)
CSN: [id number]     Arrival date & time 11/21/15  2142 History   First MD Initiated Contact with Patient 11/21/15 2144     Chief Complaint  Patient presents with  . Chest Pain    HPI Comments: [facility] similar sx, dx with pericarditis. Trops neg mutliple times. Trop and inflamm markers neg yesterday. Saw Cardiology yesterday and started indocin, first dose today.  TTE last week wo effusion. No hemoptysis, no hx of DVT/PE.  Pain got much better [facility] with GI cocktail, she has been using lots of NSAIDs  Patient is a 25 y.o. female presenting with chest pain. The history is provided by the patient and medical records.  Chest Pain Pain location:  Substernal area Pain quality: sharp   Pain radiates to:  Does not radiate Pain radiates to the back: no   Pain severity:  Severe Onset quality:  Gradual Duration:  3 weeks Timing:  Constant Progression:  Worsening Chronicity:  New Context: breathing   Context: no trauma   Context comment:  Lying flat Relieved by:  Leaning forward Exacerbated by: lying flat. Associated symptoms: heartburn and shortness of breath   Associated symptoms: no abdominal pain, no anorexia, no back pain, no claudication, no cough, no diaphoresis, no fever, no headache, no lower extremity edema, no nausea, no orthopnea, no syncope, not vomiting and no weakness   Risk factors comment:  Lupus, fibromyalgia  On plaquenil. On prednisone for pericarditis  Past Medical History  Diagnosis Date  . Asthma   . Depressed   . Fibromyalgia   . PTSD (post-traumatic stress disorder)   . Opiate abuse, continuous   . Arthritis   . GERD (gastroesophageal reflux disease)   . Lupus [facility](HCC)    Past Surgical History  Procedure Laterality Date  . No past surgeries     No family history on file. Social History  Substance Use Topics  . Smoking status: Former Smoker -- 0.50 packs/day for 3 years    Types: Cigarettes    Quit date: 02/16/2014  .  Smokeless tobacco: Current User  . Alcohol Use: 0.6 oz/week    1 Cans of beer per week     Comment: Today pt denies any alcohol use and denies she ever smoked cigarettes or used drugs.   OB History    Gravida Para Term Preterm AB TAB SAB Ectopic Multiple Living   0 0 0 0 0 0 0 0 0 0      Review of Systems  Constitutional: Negative for fever, chills, diaphoresis, activity change and appetite change.  HENT: Negative for congestion.   Respiratory: Positive for shortness of breath. Negative for cough and wheezing.   Cardiovascular: Positive for chest pain. Negative for orthopnea, claudication, leg swelling and syncope.  Gastrointestinal: Positive for heartburn. Negative for nausea, vomiting, abdominal pain and anorexia.  Musculoskeletal: Negative for back pain.  Skin: Negative for rash.  Neurological: Negative for weakness and headaches.  All other systems reviewed and are negative.  Allergies  Bee venom  Home Medications   Prior to Admission medications   Medication Sig Start Date End Date Taking? Authorizing Provider  colchicine 0.6 MG tablet Take 0.6 mg by mouth daily. 11/09/15  Yes Historical Provider, MD  cyclobenzaprine (FLEXERIL) 10 MG tablet Take 1 tablet (10 mg total) by mouth 2 (two) times daily as needed for muscle spasms. 09/11/15  Yes Gwyneth SproutWhitney Plunkett, MD  ferrous sulfate 325 (65 FE) MG tablet Take 1  tablet (325 mg total) by mouth daily. 09/11/15  Yes Gwyneth Sprout, MD  FLUoxetine (PROZAC) 10 MG capsule Take 10 mg by mouth daily.   Yes Historical Provider, MD  HYDROcodone-acetaminophen (NORCO/VICODIN) 5-325 MG tablet Take 1-2 tablets by mouth every 6 (six) hours as needed. 09/11/15  Yes Gwyneth Sprout, MD  hydroxychloroquine (PLAQUENIL) 200 MG tablet Take 200 mg by mouth 2 (two) times daily. 11/12/15  Yes Historical Provider, MD  indomethacin (INDOCIN) 50 MG capsule Take 50 mg by mouth 2 (two) times daily. 11/20/15 02/18/16 Yes Historical Provider, MD  traMADol (ULTRAM) 50 MG  tablet Take 50 mg by mouth every 6 (six) hours as needed for moderate pain.  10/29/15  Yes Historical Provider, MD  aspirin 81 MG chewable tablet Chew 4 tablets (324 mg total) by mouth daily. 11/21/15 11/26/15  Sofie Rower, MD  ranitidine (ZANTAC) 150 MG tablet Take 1 tablet (150 mg total) by mouth 2 (two) times daily. 11/21/15   Sofie Rower, MD   BP 131/82 mmHg  Pulse 93  Resp 11  SpO2 100%  LMP 11/07/2015 Physical Exam  Constitutional: She is oriented to person, place, and time. She appears well-developed and well-nourished. No distress.  Tearful in pain, sitting upright  HENT:  Head: Normocephalic and atraumatic.  Nose: Nose normal.  Eyes: Conjunctivae are normal. Pupils are equal, round, and reactive to light.  No conj pallor  Neck: Normal range of motion. Neck supple. No tracheal deviation present.  No JVD  Cardiovascular: Normal rate, regular rhythm and normal heart sounds.  Exam reveals no gallop and no friction rub.   No murmur heard. Pulmonary/Chest: Effort normal and breath sounds normal. No respiratory distress. She has no rales.  Abdominal: Soft. Bowel sounds are normal. She exhibits no distension and no mass. There is no tenderness.  No hsm  Musculoskeletal: Normal range of motion. She exhibits no edema or tenderness.  No lower extremity edema, calf tenderness, warmth, erythema or palpable cords   Neurological: She is alert and oriented to person, place, and time.  Skin: Skin is warm and dry. No rash noted.  Psychiatric: She has a normal mood and affect.  Nursing note and vitals reviewed.   ED Course  Procedures (including critical care time) Labs Review Labs Reviewed  BASIC METABOLIC PANEL - Abnormal; Notable for the following:    Glucose, Bld 111 (*)    All other components within normal limits  CBC - Abnormal; Notable for the following:    WBC 15.7 (*)    Hemoglobin 8.8 (*)    HCT 29.3 (*)    MCV 73.4 (*)    MCH 22.1 (*)    RDW 17.3 (*)    Platelets 921 (*)     All other components within normal limits  TROPONIN I  PATHOLOGIST SMEAR REVIEW  I-STAT TROPOININ, ED    Imaging Review Dg Chest 2 View  11/21/2015  CLINICAL DATA:  Chest pain for 3 days. EXAM: CHEST  2 VIEW COMPARISON:  10/26/2015 FINDINGS: The lungs are clear. The pulmonary vasculature is normal. Heart size is normal. Hilar and mediastinal contours are unremarkable. There is no pleural effusion. IMPRESSION: No active cardiopulmonary disease. Electronically Signed   By: Ellery Plunk M.D.   On: 11/21/2015 22:55   I have personally reviewed and evaluated these images and lab results as part of my medical decision-making.   EKG Interpretation   Date/Time:  Friday Nov 21 2015 22:23:08 EDT Ventricular Rate:  83 PR Interval:  149 QRS  Duration: 87 QT Interval:  340 QTC Calculation: 399 R Axis:   64 Text Interpretation:  Sinus rhythm ST elev, probable normal early repol  pattern Sinus rhythm ST elevation in multiple leads , not new Abnormal ekg  Confirmed by Gerhard Munch  MD [phone]) on 11/21/2015 10:29:56 PM      MDM   Final diagnoses:  Pericarditis  Thrombophilia (HCC)   EKG with diffuse < 1mm STE in all leads, except AVR. Unchanged compared to multiple priors.  No reciprocal changes to indicate ACS.   On multiple NSAIDs this past month, did have prior improvement with GI cocktail, provided here. 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 Trop neg, doubt myocarditis.   No clinical features of tamponade, no JVD, no hypotension.    11:06 PM pain much better controlled.  Thombophilia noted, no neuro sx. Will start ASA and have her repeat this lab test in 2 days. Could be reactive in nature.  Leukocytosis noted with recent prednisone use.  She does not appear to have a infectious source on physical exam.  Multiple NSAIDs and she notes some heartburn/ stomach irritation/ nausea. Will start zantac.   Counseled on findings during visit.  Regarding thrombophilia, she is to have  a repeat CBC in 2 days, she will make appt on Monday.  Counseled on return precautions for worsening pain, syncope, fevers, neuro sx, clotting.       EMERGENCY DEPARTMENT [place] CARDIAC EXAM "Study: Limited Ultrasound of the heart and pericardium"  INDICATIONS:Dyspnea Multiple views of the heart and pericardium are obtained with a multi-frequency probe.  PERFORMED RU:EAVWUJ  IMAGES ARCHIVED?: Yes  FINDINGS: No pericardial effusion, Normal contractility, IVC normal and Tamponade physiology absent  LIMITATIONS:  None   VIEWS USED: Subcostal 4 chamber, Parasternal long axis and Parasternal short axis  INTERPRETATION: Cardiac activity present, Pericardial effusioin absent, Cardiac tamponade absent and Normal contractility  COMMENT:  No pericardial effusion or tamponade.  No right heart strain on echo   Sofie Rower, MD 11/21/15 8119  Gerhard Munch, MD 11/21/15 2356

## 2015-11-21 NOTE — ED Notes (Signed)
Radiology called advised patient ready for transport

## 2015-11-21 NOTE — Discharge Instructions (Signed)
Pericarditis °Pericarditis is swelling (inflammation) of the pericardium. The pericardium is a thin, double-layered, fluid-filled tissue sac that surrounds the heart. The purpose of the pericardium is to contain the heart in the chest cavity and keep the heart from overexpanding. Different types of pericarditis can occur, such as: °· Acute pericarditis. Inflammation can develop suddenly in acute pericarditis. °· Chronic pericarditis. Inflammation develops gradually and is long-lasting in chronic pericarditis. °· Constrictive pericarditis. In this type of pericarditis, the layers of the pericardium stiffen and develop scar tissue. The scar tissue thickens and sticks together. This makes it difficult for the heart to pump and work as it normally does. °CAUSES  °Pericarditis can be caused from different conditions, such as: °· A bacterial, fungal or viral infection. °· After a heart attack (myocardial infarction). °· After open-heart surgery (coronary bypass graft surgery). °· Auto-immune conditions such as lupus, rheumatoid arthritis or scleroderma. °· Kidney failure. °· Low thyroid condition (hypothyroidism). °· Cancer from another part of the body that has spread (metastasized) to the pericardium. °· Chest injury or trauma. °· After radiation treatment. °· Certain medicines. °SYMPTOMS  °Symptoms of pericarditis can include: °· Chest pain. Chest pain symptoms may increase when laying down and may be relieved when sitting up and leaning forward. °· A chronic, dry cough. °· Heart palpitations. These may feel like rapid, fluttering or pounding heart beats. °· Chest pain may be worse when swallowing. °· Dizziness or fainting. °· Tiredness, fatigue or lethargy. °· Fever. °DIAGNOSIS  °Pericarditis is diagnosed by the following: °· A physical exam. A heart sound called a pericardial friction rub may be heard when your caregiver listens to your heart. °· Blood work. Blood may be drawn to check for an infection and to look at  your blood chemistry. °· Electrocardiography. During electrocardiography your heart's electrical activity is monitored and recorded with a tracing on paper (electrocardiogram [ECG]). °· Echocardiography. °· Computed tomography (CT). °· Magnetic resonance image (MRI). °TREATMENT  °To treat pericarditis, it is important to know the cause of it. The cause of pericarditis determines the treatment.  °· If the cause of pericarditis is due to an infection, treatment is based on the type of infection. If an infection is suspected in the pericardial fluid, a procedure called a pericardial fluid culture and biopsy may be done. This takes a sample of the pericardial fluid. The sample is sent to a lab which runs tests on the pericardial fluid to check for an infection. °· If the autoimmune disease is the cause, treatment of the autoimmune condition will help improve the pericarditis. °· If the cause of pericarditis is not known, anti-inflammatory medicines may be used to help decrease the inflammation. °· Surgery may be needed. The following are types of surgeries or procedures that may be done to treat pericarditis: °¨ Pericardial window. A pericardial window makes a cut (incision) into the pericardial sac. This allows excess fluid in the pericardium to drain. °¨ Pericardiocentesis. A pericardiocentesis is also known as a pericardial tap. This procedure uses a needle that is guided by X-ray to drain (aspirate) excess fluid from the pericardium. °¨ Pericardiectomy. A pericardiectomy removes part or all of the pericardium. °HOME CARE INSTRUCTIONS  °· Do not smoke. If you smoke, quit. Your caregiver can help you quit smoking. °· Maintain a healthy weight. °· Follow an exercise program as directed by your health care provider. You may need to limit your exercising until your symptoms go away. °· If you drink alcohol, do so in moderation. °·   Eat a heart healthy diet. A registered dietitian can help you learn about healthy food  choices. °· Keep a list of all your medicines with you at all times. Include the name, dose, how often it is taken and how it is taken. °SEEK IMMEDIATE MEDICAL CARE IF:  °· You have chest pain or feelings of chest pressure. °· You have sweating (diaphoresis) when at rest. °· You have irregular heartbeats (palpitations). °· You have rapid, racing heart beats. °· You have unexplained fainting episodes. °· You feel sick to your stomach (nausea) or vomiting without cause. °· You have unexplained weakness. °If you develop any of the symptoms which originally made you seek care, call for local emergency medical help. Do not drive yourself to the hospital. °  °This information is not intended to replace advice given to you by your health care provider. Make sure you discuss any questions you have with your health care provider. °  °Document Released: 12/29/2000 Document Revised: 11/19/2014 Document Reviewed: 01/15/2015 °Elsevier Interactive Patient Education ©2016 [employer] ° °

## 2015-11-24 LAB — PATHOLOGIST SMEAR REVIEW

## 2015-12-23 ENCOUNTER — Emergency Department (HOSPITAL_COMMUNITY)
Admission: EM | Admit: 2015-12-23 | Discharge: 2015-12-23 | Discharge status: Against Medical Advice | Payer: BLUE CROSS/BLUE SHIELD | Attending: Dermatology | Admitting: Dermatology

## 2015-12-23 DIAGNOSIS — Z87891 Personal history of nicotine dependence: Secondary | ICD-10-CM | POA: Insufficient documentation

## 2015-12-23 DIAGNOSIS — R509 Fever, unspecified: Secondary | ICD-10-CM | POA: Diagnosis present

## 2015-12-23 DIAGNOSIS — Z5321 Procedure and treatment not carried out due to patient leaving prior to being seen by health care provider: Secondary | ICD-10-CM | POA: Insufficient documentation

## 2015-12-23 DIAGNOSIS — J45909 Unspecified asthma, uncomplicated: Secondary | ICD-10-CM | POA: Insufficient documentation

## 2015-12-23 NOTE — ED Notes (Signed)
Pt called from triage, no answer 

## 2016-02-24 ENCOUNTER — Encounter (HOSPITAL_BASED_OUTPATIENT_CLINIC_OR_DEPARTMENT_OTHER): Payer: Self-pay | Admitting: *Deleted

## 2016-02-24 ENCOUNTER — Emergency Department (HOSPITAL_BASED_OUTPATIENT_CLINIC_OR_DEPARTMENT_OTHER): Payer: BLUE CROSS/BLUE SHIELD

## 2016-02-24 ENCOUNTER — Emergency Department (HOSPITAL_BASED_OUTPATIENT_CLINIC_OR_DEPARTMENT_OTHER)
Admission: EM | Admit: 2016-02-24 | Discharge: 2016-02-24 | Disposition: A | Discharge status: Home / Self Care | Payer: BLUE CROSS/BLUE SHIELD | Attending: Emergency Medicine | Admitting: Emergency Medicine

## 2016-02-24 DIAGNOSIS — Z7982 Long term (current) use of aspirin: Secondary | ICD-10-CM | POA: Diagnosis not present

## 2016-02-24 DIAGNOSIS — Z87891 Personal history of nicotine dependence: Secondary | ICD-10-CM | POA: Insufficient documentation

## 2016-02-24 DIAGNOSIS — Z79899 Other long term (current) drug therapy: Secondary | ICD-10-CM | POA: Insufficient documentation

## 2016-02-24 DIAGNOSIS — M25521 Pain in right elbow: Secondary | ICD-10-CM | POA: Insufficient documentation

## 2016-02-24 DIAGNOSIS — R509 Fever, unspecified: Secondary | ICD-10-CM | POA: Diagnosis not present

## 2016-02-24 DIAGNOSIS — M25561 Pain in right knee: Secondary | ICD-10-CM | POA: Insufficient documentation

## 2016-02-24 DIAGNOSIS — R0600 Dyspnea, unspecified: Secondary | ICD-10-CM

## 2016-02-24 DIAGNOSIS — M25562 Pain in left knee: Secondary | ICD-10-CM | POA: Diagnosis not present

## 2016-02-24 DIAGNOSIS — J45909 Unspecified asthma, uncomplicated: Secondary | ICD-10-CM | POA: Insufficient documentation

## 2016-02-24 DIAGNOSIS — M25522 Pain in left elbow: Secondary | ICD-10-CM | POA: Diagnosis not present

## 2016-02-24 DIAGNOSIS — R0602 Shortness of breath: Secondary | ICD-10-CM | POA: Diagnosis present

## 2016-02-24 LAB — CBC WITH DIFFERENTIAL/PLATELET
BASOS PCT: 1 %
Basophils Absolute: 0 10*3/uL (ref 0.0–0.1)
EOS ABS: 0.7 10*3/uL (ref 0.0–0.7)
EOS PCT: 11 %
HCT: 38 % (ref 36.0–46.0)
Hemoglobin: 12.1 g/dL (ref 12.0–15.0)
LYMPHS ABS: 2.1 10*3/uL (ref 0.7–4.0)
Lymphocytes Relative: 33 %
MCH: 24.9 pg — AB (ref 26.0–34.0)
MCHC: 31.8 g/dL (ref 30.0–36.0)
MCV: 78.2 fL (ref 78.0–100.0)
MONOS PCT: 8 %
Monocytes Absolute: 0.5 10*3/uL (ref 0.1–1.0)
Neutro Abs: 3.2 10*3/uL (ref 1.7–7.7)
Neutrophils Relative %: 47 %
PLATELETS: 410 10*3/uL — AB (ref 150–400)
RBC: 4.86 MIL/uL (ref 3.87–5.11)
RDW: 17.3 % — ABNORMAL HIGH (ref 11.5–15.5)
WBC: 6.5 10*3/uL (ref 4.0–10.5)

## 2016-02-24 LAB — COMPREHENSIVE METABOLIC PANEL
ALT: 28 U/L (ref 14–54)
AST: 45 U/L — ABNORMAL HIGH (ref 15–41)
Albumin: 3.9 g/dL (ref 3.5–5.0)
Alkaline Phosphatase: 60 U/L (ref 38–126)
Anion gap: 8 (ref 5–15)
BUN: 9 mg/dL (ref 6–20)
CHLORIDE: 107 mmol/L (ref 101–111)
CO2: 21 mmol/L — ABNORMAL LOW (ref 22–32)
Calcium: 9 mg/dL (ref 8.9–10.3)
Creatinine, Ser: 0.91 mg/dL (ref 0.44–1.00)
Glucose, Bld: 82 mg/dL (ref 65–99)
POTASSIUM: 4.2 mmol/L (ref 3.5–5.1)
Sodium: 136 mmol/L (ref 135–145)
Total Bilirubin: 0.6 mg/dL (ref 0.3–1.2)
Total Protein: 6.6 g/dL (ref 6.5–8.1)

## 2016-02-24 LAB — TROPONIN I

## 2016-02-24 NOTE — ED Provider Notes (Signed)
[facility] Provider Note   CSN: [id number] Arrival date & time: 02/24/16  1347  First Provider Contact:  First MD Initiated Contact with Patient 02/24/16 1500        History   Chief Complaint Chief Complaint  Patient presents with  . Shortness of Breath  . Joint Pain  "I think I'm having a lupus flareup"  HPI Molly Perkins is a 25 y.o. female.Plains of bilateral elbow pain and bilateral knee pain for the past 2 weeks accompanied by intermittent shortness of breath for the past 2 weeks. Dyspnea is worse with exertion. She reports temperature 100.9 approximately 3 days ago no nausea or vomiting. She experienced an episode of chest pain anterior lasting a split-second immediately prior to my entering the room. She was seen by her lupus physician in [place](whose name she does not recall) earlier today, for same complaints. He increased her dose of indomethacin and methotrexate. Dyspnea is worse with exertion nothing makes joint pain better or worse. No other associated symptoms  HPI  Past Medical History:  Diagnosis Date  . Arthritis   . Asthma   . Depressed   . Fibromyalgia   . GERD (gastroesophageal reflux disease)   . Lupus (HCC)   . Opiate abuse, continuous   . PTSD (post-traumatic stress disorder)     Patient Active Problem List   Diagnosis Date Noted  . 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 Fibromyalgia 10/19/2012  . Neuropathic pain of hand 10/19/2012  . Marijuana dependence (HCC) 05/04/2012    Class: Chronic    Past Surgical History:  Procedure Laterality Date  . NO PAST SURGERIES      OB History    Gravida Para Term Preterm AB Living   0 0 0 0 0 0   SAB TAB Ectopic Multiple Live Births   0 0 0 0         Home Medications    Prior to Admission medications   Medication Sig Start Date End Date Taking? Authorizing Provider  aspirin 81 MG tablet Take 81 mg by mouth daily.   Yes Historical Provider, MD  colchicine 0.6  MG tablet Take 0.6 mg by mouth daily. 11/09/15  Yes Historical Provider, MD  ferrous sulfate 325 (65 FE) MG tablet Take 1 tablet (325 mg total) by mouth daily. 09/11/15  Yes Gwyneth SproutWhitney Plunkett, MD  FLUoxetine (PROZAC) 10 MG capsule Take 10 mg by mouth daily.   Yes Historical Provider, MD  FOLIC ACID PO Take by mouth.   Yes Historical Provider, MD  HYDROcodone-acetaminophen (NORCO/VICODIN) 5-325 MG tablet Take 1-2 tablets by mouth every 6 (six) hours as needed. 09/11/15  Yes Gwyneth SproutWhitney Plunkett, MD  hydroxychloroquine (PLAQUENIL) 200 MG tablet Take 200 mg by mouth 2 (two) times daily. 11/12/15  Yes Historical Provider, MD  INDOMETHACIN PO Take by mouth.   Yes Historical Provider, MD  METHOTREXATE PO Take by mouth.   Yes Historical Provider, MD  omeprazole (PRILOSEC) 20 MG capsule Take 20 mg by mouth daily.   Yes Historical Provider, MD  cyclobenzaprine (FLEXERIL) 10 MG tablet Take 1 tablet (10 mg total) by mouth 2 (two) times daily as needed for muscle spasms. 09/11/15   Gwyneth SproutWhitney Plunkett, MD  ranitidine (ZANTAC) 150 MG tablet Take 1 tablet (150 mg total) by mouth 2 (two) times daily. 11/21/15   Sofie RowerMike Stengel, MD  traMADol (ULTRAM) 50 MG tablet Take 50 mg by mouth every 6 (six) hours as needed for moderate pain.  10/29/15  Historical Provider, MD    Family History No family history on file.  Social History Social History  Substance Use Topics  . Smoking status: Former Smoker    Packs/day: 0.50    Years: 3.00    Types: Cigarettes    Quit date: 02/16/2014  . Smokeless tobacco: Never Used  . Alcohol use No     Comment: denies current use  Denies smoking   Allergies   Bee venom   Review of Systems Review of Systems  Constitutional: Positive for fever.  HENT: Negative.   Respiratory: Positive for chest tightness and shortness of breath.   Cardiovascular: Negative.   Gastrointestinal: Negative.   Musculoskeletal: Positive for arthralgias.  Skin: Negative.   Neurological: Negative.     Psychiatric/Behavioral: Negative.   All other systems reviewed and are negative.    Physical Exam Updated Vital Signs BP 113/91   Pulse 96   Temp 99.1 F (37.3 C) (Oral)   Resp 18   Ht  (1.676 m)   Wt 143 lb (64.9 kg)   LMP 02/23/2016   SpO2 100%   BMI 23.08 kg/m   Physical Exam  Constitutional: She appears well-developed and well-nourished. No distress.  HENT:  Head: Normocephalic and atraumatic.  Eyes: Conjunctivae are normal. Pupils are equal, round, and reactive to light.  Neck: Neck supple. No tracheal deviation present. No thyromegaly present.  Cardiovascular: Normal rate and regular rhythm.   No murmur heard. Pulmonary/Chest: Effort normal and breath sounds normal.  Abdominal: Soft. Bowel sounds are normal. She exhibits no distension. There is no tenderness.  Musculoskeletal: Normal range of motion. She exhibits no edema or tenderness.  All 4 extremities without redness swelling or tenderness neurovascularly intact  Neurological: She is alert. Coordination normal.  Skin: Skin is warm and dry. Capillary refill takes less than 2 seconds.  Psychiatric: She has a normal mood and affect. Judgment normal.  Nursing note and vitals reviewed.    ED Treatments / Results  Labs (all labs ordered are listed, but only abnormal results are displayed) Labs Reviewed  CBC WITH DIFFERENTIAL/PLATELET - Abnormal; Notable for the following:       Result Value   MCH 24.9 (*)    RDW 17.3 (*)    Platelets 410 (*)    All other components within normal limits  COMPREHENSIVE METABOLIC PANEL - Abnormal; Notable for the following:    CO2 21 (*)    AST 45 (*)    All other components within normal limits  TROPONIN I    EKG  EKG Interpretation  Date/Time:  Tuesday February 24 2016 15:19:18 EDT Ventricular Rate:  94 PR Interval:    QRS Duration: 73 QT Interval:  362 QTC Calculation: 453 R Axis:   69 Text Interpretation:  Sinus rhythm ST elevation suggests acute pericarditis  No significant change since last tracing Confirmed by Ethelda Chick  MD, Shresta Risden [phone]) on 02/24/2016 3:27:36 PM       Radiology Dg Chest 2 View  Result Date: 02/24/2016 CLINICAL DATA:  Shortness of breath and joint pain for 2 days EXAM: CHEST  2 VIEW COMPARISON:  Nov 21, 2015 FINDINGS: The heart size and mediastinal contours are within normal limits. Both lungs are clear. The visualized skeletal structures are unremarkable. IMPRESSION: No active cardiopulmonary disease. Electronically Signed   By: Gerome Ermagene Saidi III M.D   On: 02/24/2016 14:36    Procedures Procedures (including critical care time)  Medications Ordered in ED Medications - No data to display  Initial Impression / Assessment and Plan / ED Course  I have reviewed the triage vital signs and the nursing notes.  Pertinent labs & imaging results that were available during my care of the patient were reviewed by me and considered in my medical decision making (see chart for details).  Clinical Course   Chest x-ray viewed by me. Results for orders placed or performed during the hospital encounter of 02/24/16  CBC with Differential/Platelet  Result Value Ref Range   WBC 6.5 4.0 - 10.5 K/uL   RBC 4.86 3.87 - 5.11 MIL/uL   Hemoglobin 12.1 12.0 - 15.0 g/dL   HCT 16.1 09.6 - 04.5 %   MCV 78.2 78.0 - 100.0 fL   MCH 24.9 (L) 26.0 - 34.0 pg   MCHC 31.8 30.0 - 36.0 g/dL   RDW 40.9 (H) 81.1 - 91.4 %   Platelets 410 (H) 150 - 400 K/uL   Neutrophils Relative % 47 %   Neutro Abs 3.2 1.7 - 7.7 K/uL   Lymphocytes Relative 33 %   Lymphs Abs 2.1 0.7 - 4.0 K/uL   Monocytes Relative 8 %   Monocytes Absolute 0.5 0.1 - 1.0 K/uL   Eosinophils Relative 11 %   Eosinophils Absolute 0.7 0.0 - 0.7 K/uL   Basophils Relative 1 %   Basophils Absolute 0.0 0.0 - 0.1 K/uL  Comprehensive metabolic panel  Result Value Ref Range   Sodium 136 135 - 145 mmol/L   Potassium 4.2 3.5 - 5.1 mmol/L   Chloride 107 101 - 111 mmol/L   CO2 21 (L) 22 - 32 mmol/L    Glucose, Bld 82 65 - 99 mg/dL   BUN 9 6 - 20 mg/dL   Creatinine, Ser 7.82 0.44 - 1.00 mg/dL   Calcium 9.0 8.9 - 95.6 mg/dL   Total Protein 6.6 6.5 - 8.1 g/dL   Albumin 3.9 3.5 - 5.0 g/dL   AST 45 (H) 15 - 41 U/L   ALT 28 14 - 54 U/L   Alkaline Phosphatase 60 38 - 126 U/L   Total Bilirubin 0.6 0.3 - 1.2 mg/dL   GFR calc non Af Amer >60 >60 mL/min   GFR calc Af Amer >60 >60 mL/min   Anion gap 8 5 - 15  Troponin I  Result Value Ref Range   Troponin I <0.03 <0.03 ng/mL   Dg Chest 2 View  Result Date: 02/24/2016 CLINICAL DATA:  Shortness of breath and joint pain for 2 days EXAM: CHEST  2 VIEW COMPARISON:  Nov 21, 2015 FINDINGS: The heart size and mediastinal contours are within normal limits. Both lungs are clear. The visualized skeletal structures are unremarkable. IMPRESSION: No active cardiopulmonary disease. Electronically Signed   By: Gerome Trinidad Ingle III M.D   On: 02/24/2016 14:36   Patient states symptoms similar to lupus flareup that she's had in the past. I suggest she take medications as prescribed and contact her lupus specialist if not improved in a week. Highly doubt acute coronary syndrome with highly atypical symptoms in this 25 year old. Strongly doubt pulmonary embolism. Highly atypical symptoms, PERC neg  Final Clinical Impressions(s) / ED Diagnoses  Diagnosis #1 arthralgias #2 dyspnea Final diagnoses:  None    New Prescriptions New Prescriptions   No medications on file     Doug Sou, MD 02/24/16 1531

## 2016-02-24 NOTE — ED Triage Notes (Signed)
Hx of lupus. Reports flare up x 4 days with SOB and joint pain

## 2016-02-24 NOTE — ED Notes (Signed)
MD at bedside. 

## 2016-02-24 NOTE — Discharge Instructions (Signed)
Contact your lupus doctor if you are not improved in a week. Take your medications prescribed by your lupus doctor today at the new dosages

## 2016-02-24 NOTE — ED Notes (Signed)
Pt provided a note showing she was in the emergenct department today per her request

## 2016-02-24 NOTE — ED Notes (Signed)
Nurse first-pt NAD-O2 sat 100%-HR 95-RR 20

## 2016-06-10 ENCOUNTER — Emergency Department (HOSPITAL_BASED_OUTPATIENT_CLINIC_OR_DEPARTMENT_OTHER): Payer: BLUE CROSS/BLUE SHIELD

## 2016-06-10 ENCOUNTER — Emergency Department (HOSPITAL_BASED_OUTPATIENT_CLINIC_OR_DEPARTMENT_OTHER)
Admission: EM | Admit: 2016-06-10 | Discharge: 2016-06-10 | Disposition: A | Discharge status: Home / Self Care | Payer: BLUE CROSS/BLUE SHIELD | Attending: Emergency Medicine | Admitting: Emergency Medicine

## 2016-06-10 ENCOUNTER — Encounter (HOSPITAL_BASED_OUTPATIENT_CLINIC_OR_DEPARTMENT_OTHER): Payer: Self-pay | Admitting: Emergency Medicine

## 2016-06-10 DIAGNOSIS — J069 Acute upper respiratory infection, unspecified: Secondary | ICD-10-CM | POA: Diagnosis not present

## 2016-06-10 DIAGNOSIS — J45909 Unspecified asthma, uncomplicated: Secondary | ICD-10-CM | POA: Insufficient documentation

## 2016-06-10 DIAGNOSIS — Z79899 Other long term (current) drug therapy: Secondary | ICD-10-CM | POA: Diagnosis not present

## 2016-06-10 DIAGNOSIS — B9789 Other viral agents as the cause of diseases classified elsewhere: Secondary | ICD-10-CM

## 2016-06-10 DIAGNOSIS — Z87891 Personal history of nicotine dependence: Secondary | ICD-10-CM | POA: Insufficient documentation

## 2016-06-10 DIAGNOSIS — Z7982 Long term (current) use of aspirin: Secondary | ICD-10-CM | POA: Insufficient documentation

## 2016-06-10 DIAGNOSIS — R05 Cough: Secondary | ICD-10-CM | POA: Diagnosis present

## 2016-06-10 LAB — URINE MICROSCOPIC-ADD ON

## 2016-06-10 LAB — URINALYSIS, ROUTINE W REFLEX MICROSCOPIC
Bilirubin Urine: NEGATIVE
Glucose, UA: NEGATIVE mg/dL
Ketones, ur: NEGATIVE mg/dL
LEUKOCYTES UA: NEGATIVE
NITRITE: NEGATIVE
PROTEIN: NEGATIVE mg/dL
SPECIFIC GRAVITY, URINE: 1.011 (ref 1.005–1.030)
pH: 5 (ref 5.0–8.0)

## 2016-06-10 LAB — PREGNANCY, URINE: PREG TEST UR: NEGATIVE

## 2016-06-10 MED ORDER — DM-GUAIFENESIN ER 30-600 MG PO TB12: 1.0000 | ORAL_TABLET | Freq: Two times a day (BID) | ORAL | 0 refills | Status: DC | PRN

## 2016-06-10 MED ORDER — IBUPROFEN 400 MG PO TABS
600.0000 mg | ORAL_TABLET | Freq: Once | ORAL | Status: AC
  Administered 2016-06-10: 600 mg via ORAL
  Filled 2016-06-10: qty 1

## 2016-06-10 MED ORDER — BENZONATATE 100 MG PO CAPS: 100.0000 mg | ORAL_CAPSULE | Freq: Three times a day (TID) | ORAL | 0 refills | Status: DC | PRN

## 2016-06-10 MED ORDER — HYDROCODONE-ACETAMINOPHEN 5-325 MG PO TABS
1.0000 | ORAL_TABLET | Freq: Once | ORAL | Status: AC
  Administered 2016-06-10: 1 via ORAL
  Filled 2016-06-10: qty 1

## 2016-06-10 NOTE — ED Notes (Signed)
Patient transported to X-ray 

## 2016-06-10 NOTE — ED Triage Notes (Signed)
Patient reports back and neck pain.  Also coughing up phlegm.  States "my joints are stiff and my skin hurts".

## 2016-06-10 NOTE — ED Provider Notes (Signed)
[facility] Provider Note   CSN: [id number] Arrival date & time: 06/10/16  1633     History   Chief Complaint Chief Complaint  Patient presents with  . Lupus    HPI Molly Perkins is a 25 y.o. female.  25yo F w/ PMH including lupus, fibromyalgia, depression who p/w cough, joint pains. 2 days ago the patient began having cough productive of phlegm and back and neck pain. She also reports joint stiffness and her skin hurting "all over" today. She has had no fevers, vomiting, diarrhea, or runny nose but she does report sore throat. No sick contacts or recent travel. She has not taken any medications for her symptoms. No rash. No urinary symptoms, no vaginal discharge. She is menstruating currently. She does mention some chest pain that is like previous episodes of pericarditis.    The history is provided by the patient.    Past Medical History:  Diagnosis Date  . Arthritis   . Asthma   . Depressed   . Fibromyalgia   . GERD (gastroesophageal reflux disease)   . Lupus   . Opiate abuse, continuous   . PTSD (post-traumatic stress disorder)     Patient Active Problem List   Diagnosis Date Noted  . 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 Fibromyalgia 10/19/2012  . Neuropathic pain of hand 10/19/2012  . Marijuana dependence (HCC) 05/04/2012    Class: Chronic    Past Surgical History:  Procedure Laterality Date  . NO PAST SURGERIES      OB History    Gravida Para Term Preterm AB Living   0 0 0 0 0 0   SAB TAB Ectopic Multiple Live Births   0 0 0 0         Home Medications    Prior to Admission medications   Medication Sig Start Date End Date Taking? Authorizing Provider  aspirin 81 MG tablet Take 81 mg by mouth daily.    Historical Provider, MD  benzonatate (TESSALON) 100 MG capsule Take 1 capsule (100 mg total) by mouth 3 (three) times daily as needed for cough. 06/10/16   Laurence Spates, MD  colchicine 0.6 MG tablet Take 0.6  mg by mouth daily. 11/09/15   Historical Provider, MD  cyclobenzaprine (FLEXERIL) 10 MG tablet Take 1 tablet (10 mg total) by mouth 2 (two) times daily as needed for muscle spasms. 09/11/15   Gwyneth Sprout, MD  dextromethorphan-guaiFENesin (MUCINEX DM) 30-600 MG 12hr tablet Take 1 tablet by mouth 2 (two) times daily as needed for cough. 06/10/16   Laurence Spates, MD  ferrous sulfate 325 (65 FE) MG tablet Take 1 tablet (325 mg total) by mouth daily. 09/11/15   Gwyneth Sprout, MD  FLUoxetine (PROZAC) 10 MG capsule Take 10 mg by mouth daily.    Historical Provider, MD  FOLIC ACID PO Take by mouth.    Historical Provider, MD  HYDROcodone-acetaminophen (NORCO/VICODIN) 5-325 MG tablet Take 1-2 tablets by mouth every 6 (six) hours as needed. 09/11/15   Gwyneth Sprout, MD  hydroxychloroquine (PLAQUENIL) 200 MG tablet Take 200 mg by mouth 2 (two) times daily. 11/12/15   Historical Provider, MD  INDOMETHACIN PO Take by mouth.    Historical Provider, MD  METHOTREXATE PO Take by mouth.    Historical Provider, MD  omeprazole (PRILOSEC) 20 MG capsule Take 20 mg by mouth daily.    Historical Provider, MD  ranitidine (ZANTAC) 150 MG tablet Take 1 tablet (150 mg total) by  mouth 2 (two) times daily. 11/21/15   Sofie RowerMike Stengel, MD  traMADol (ULTRAM) 50 MG tablet Take 50 mg by mouth every 6 (six) hours as needed for moderate pain.  10/29/15   Historical Provider, MD    Family History History reviewed. No pertinent family history.  Social History Social History  Substance Use Topics  . Smoking status: Former Smoker    Packs/day: 0.50    Years: 3.00    Types: Cigarettes    Quit date: 02/16/2014  . Smokeless tobacco: Never Used  . Alcohol use No     Comment: denies current use     Allergies   Bee venom   Review of Systems Review of Systems 10 Systems reviewed and are negative for acute change except as noted in the HPI.   Physical Exam Updated Vital Signs BP 126/94 (BP Location: Right Arm)    Pulse 71   Temp 97.5 F (36.4 C) (Oral)   Resp 16   Ht 5\' 5"  (1.651 m)   Wt 140 lb (63.5 kg)   LMP 06/07/2016   SpO2 100%   BMI 23.30 kg/m   Physical Exam  Constitutional: She is oriented to person, place, and time. She appears well-developed and well-nourished. No distress.  HENT:  Head: Normocephalic and atraumatic.  Right Ear: Tympanic membrane and ear canal normal.  Left Ear: Tympanic membrane and ear canal normal.  Mouth/Throat: No oropharyngeal exudate.  Moist mucous membranes No tonsillar edema or asymmetry  Eyes: Conjunctivae are normal. Pupils are equal, round, and reactive to light.  Neck: Neck supple.  Cardiovascular: Normal rate, regular rhythm and normal heart sounds.   No murmur heard. Pulmonary/Chest: Effort normal and breath sounds normal.  Abdominal: Soft. Bowel sounds are normal. She exhibits no distension. There is no tenderness.  Musculoskeletal: She exhibits no edema.  Neurological: She is alert and oriented to person, place, and time.  Fluent speech  Skin: Skin is warm and dry. No rash noted.  Psychiatric: She has a normal mood and affect. Judgment normal.  Nursing note and vitals reviewed.    ED Treatments / Results  Labs (all labs ordered are listed, but only abnormal results are displayed) Labs Reviewed  URINALYSIS, ROUTINE W REFLEX MICROSCOPIC (NOT AT [facility]) - Abnormal; Notable for the following:       Result Value   Hgb urine dipstick LARGE (*)    All other components within normal limits  URINE MICROSCOPIC-ADD ON - Abnormal; Notable for the following:    Squamous Epithelial / LPF 0-5 (*)    Bacteria, UA RARE (*)    All other components within normal limits  PREGNANCY, URINE    EKG  EKG Interpretation None       Radiology Dg Chest 2 View  Result Date: 06/10/2016 CLINICAL DATA:  Cough and chills. EXAM: CHEST  2 VIEW COMPARISON:  February 24, 2016 FINDINGS: The heart size and mediastinal contours are within normal limits. Both lungs  are clear. The visualized skeletal structures are unremarkable. IMPRESSION: No active cardiopulmonary disease. Electronically Signed   By: Gerome Samavid  Williams III M.D   On: 06/10/2016 17:19    Procedures Procedures (including critical care time)  Medications Ordered in ED Medications  HYDROcodone-acetaminophen (NORCO/VICODIN) 5-325 MG per tablet 1 tablet (1 tablet Oral Given 06/10/16 1705)  ibuprofen (ADVIL,MOTRIN) tablet 600 mg (600 mg Oral Given 06/10/16 1705)     Initial Impression / Assessment and Plan / ED Course  I have reviewed the triage vital signs and the  nursing notes.  Pertinent labs & imaging results that were available during my care of the patient were reviewed by me and considered in my medical decision making (see chart for details).  Clinical Course    Pt with a few days of cough, sore throat, joint pain, and back and neck pain. Pt did mention CP however states that pain is just like his episodes of pericarditis. She has had extensive workup previously including CTA, all of which have been negative. Given her young age and extensive history of pericarditis, do not feel she needs workup for her chest pain currently. Her predominant symptoms suggest a viral etiology. Chest x-ray here is clear. UA with blood due to menstruation but no proteinuria to suggest kidney dysfunction from SLE. Chest supportive care for sore throat and cough and provided with cough medication. Instructed to follow-up with PCP and cardiologist if her symptoms continue. Patient discharged in satisfactory condition.  Final Clinical Impressions(s) / ED Diagnoses   Final diagnoses:  Viral URI with cough    New Prescriptions New Prescriptions   BENZONATATE (TESSALON) 100 MG CAPSULE    Take 1 capsule (100 mg total) by mouth 3 (three) times daily as needed for cough.   DEXTROMETHORPHAN-GUAIFENESIN (MUCINEX DM) 30-600 MG 12HR TABLET    Take 1 tablet by mouth 2 (two) times daily as needed for cough.       Laurence Spatesachel Morgan Little, MD 06/10/16 [phone]

## 2016-06-11 ENCOUNTER — Telehealth: Payer: Self-pay | Admitting: *Deleted

## 2016-08-11 ENCOUNTER — Emergency Department: Payer: BLUE CROSS/BLUE SHIELD

## 2016-08-11 ENCOUNTER — Encounter: Payer: Self-pay | Admitting: *Deleted

## 2016-08-11 ENCOUNTER — Observation Stay
Admission: EM | Admit: 2016-08-11 | Discharge: 2016-08-12 | Disposition: A | Discharge status: Home / Self Care | Payer: BLUE CROSS/BLUE SHIELD | Attending: Internal Medicine | Admitting: Internal Medicine

## 2016-08-11 ENCOUNTER — Observation Stay
Admit: 2016-08-11 | Discharge: 2016-08-11 | Disposition: A | Discharge status: Home / Self Care | Payer: BLUE CROSS/BLUE SHIELD | Attending: Internal Medicine | Admitting: Internal Medicine

## 2016-08-11 DIAGNOSIS — M329 Systemic lupus erythematosus, unspecified: Secondary | ICD-10-CM | POA: Diagnosis not present

## 2016-08-11 DIAGNOSIS — Z87891 Personal history of nicotine dependence: Secondary | ICD-10-CM | POA: Diagnosis not present

## 2016-08-11 DIAGNOSIS — R079 Chest pain, unspecified: Secondary | ICD-10-CM | POA: Diagnosis not present

## 2016-08-11 DIAGNOSIS — M1990 Unspecified osteoarthritis, unspecified site: Secondary | ICD-10-CM | POA: Diagnosis not present

## 2016-08-11 DIAGNOSIS — F1110 Opioid abuse, uncomplicated: Secondary | ICD-10-CM | POA: Diagnosis not present

## 2016-08-11 DIAGNOSIS — Z79899 Other long term (current) drug therapy: Secondary | ICD-10-CM | POA: Diagnosis not present

## 2016-08-11 DIAGNOSIS — I308 Other forms of acute pericarditis: Secondary | ICD-10-CM | POA: Diagnosis present

## 2016-08-11 DIAGNOSIS — F331 Major depressive disorder, recurrent, moderate: Secondary | ICD-10-CM | POA: Insufficient documentation

## 2016-08-11 DIAGNOSIS — Z8719 Personal history of other diseases of the digestive system: Secondary | ICD-10-CM | POA: Diagnosis not present

## 2016-08-11 DIAGNOSIS — J45909 Unspecified asthma, uncomplicated: Secondary | ICD-10-CM | POA: Diagnosis not present

## 2016-08-11 DIAGNOSIS — I319 Disease of pericardium, unspecified: Secondary | ICD-10-CM

## 2016-08-11 DIAGNOSIS — M797 Fibromyalgia: Secondary | ICD-10-CM | POA: Diagnosis not present

## 2016-08-11 DIAGNOSIS — K219 Gastro-esophageal reflux disease without esophagitis: Secondary | ICD-10-CM | POA: Diagnosis present

## 2016-08-11 DIAGNOSIS — F4310 Post-traumatic stress disorder, unspecified: Secondary | ICD-10-CM | POA: Diagnosis not present

## 2016-08-11 DIAGNOSIS — Z7982 Long term (current) use of aspirin: Secondary | ICD-10-CM | POA: Diagnosis not present

## 2016-08-11 DIAGNOSIS — G8929 Other chronic pain: Secondary | ICD-10-CM | POA: Insufficient documentation

## 2016-08-11 DIAGNOSIS — R1013 Epigastric pain: Secondary | ICD-10-CM | POA: Diagnosis present

## 2016-08-11 HISTORY — DX: Personal history of peptic ulcer disease: Z87.11

## 2016-08-11 HISTORY — DX: Personal history of other diseases of the digestive system: Z87.19

## 2016-08-11 LAB — CBC
HCT: 38.5 % (ref 35.0–47.0)
HEMOGLOBIN: 12.7 g/dL (ref 12.0–16.0)
MCH: 26.6 pg (ref 26.0–34.0)
MCHC: 33 g/dL (ref 32.0–36.0)
MCV: 80.7 fL (ref 80.0–100.0)
PLATELETS: 311 10*3/uL (ref 150–440)
RBC: 4.77 MIL/uL (ref 3.80–5.20)
RDW: 17.7 % — ABNORMAL HIGH (ref 11.5–14.5)
WBC: 7 10*3/uL (ref 3.6–11.0)

## 2016-08-11 LAB — COMPREHENSIVE METABOLIC PANEL
ALBUMIN: 4.3 g/dL (ref 3.5–5.0)
ALK PHOS: 53 U/L (ref 38–126)
ALT: 14 U/L (ref 14–54)
AST: 26 U/L (ref 15–41)
Anion gap: 7 (ref 5–15)
BUN: 13 mg/dL (ref 6–20)
CALCIUM: 9 mg/dL (ref 8.9–10.3)
CO2: 26 mmol/L (ref 22–32)
CREATININE: 0.94 mg/dL (ref 0.44–1.00)
Chloride: 103 mmol/L (ref 101–111)
GFR calc Af Amer: >60 mL/min (ref >60)
GFR calc non Af Amer: >60 mL/min (ref >60)
GLUCOSE: 89 mg/dL (ref 65–99)
Potassium: 4.2 mmol/L (ref 3.5–5.1)
SODIUM: 136 mmol/L (ref 135–145)
Total Bilirubin: 0.7 mg/dL (ref 0.3–1.2)
Total Protein: 7.1 g/dL (ref 6.5–8.1)

## 2016-08-11 LAB — TROPONIN I

## 2016-08-11 LAB — LIPASE, BLOOD: Lipase: 24 U/L (ref 11–51)

## 2016-08-11 LAB — HEMOGLOBIN: HEMOGLOBIN: 11.9 g/dL — AB (ref 12.0–16.0)

## 2016-08-11 MED ORDER — SUCRALFATE 1 GM/10ML PO SUSP
1.0000 g | Freq: Three times a day (TID) | ORAL | Status: DC
  Administered 2016-08-11 – 2016-08-12 (×3): 1 g via ORAL
  Filled 2016-08-11 (×8): qty 10

## 2016-08-11 MED ORDER — SUCRALFATE 1 G PO TABS: 1.0000 g | ORAL_TABLET | Freq: Four times a day (QID) | ORAL | 0 refills | Status: DC

## 2016-08-11 MED ORDER — ONDANSETRON HCL 4 MG/2ML IJ SOLN
4.0000 mg | Freq: Once | INTRAMUSCULAR | Status: AC
  Administered 2016-08-11: 4 mg via INTRAVENOUS
  Filled 2016-08-11: qty 2

## 2016-08-11 MED ORDER — COLCHICINE 0.6 MG PO TABS
0.6000 mg | ORAL_TABLET | Freq: Every day | ORAL | Status: DC
  Administered 2016-08-11 – 2016-08-12 (×2): 0.6 mg via ORAL
  Filled 2016-08-11 (×2): qty 1

## 2016-08-11 MED ORDER — HYDROXYCHLOROQUINE SULFATE 200 MG PO TABS
200.0000 mg | ORAL_TABLET | Freq: Two times a day (BID) | ORAL | Status: DC
  Administered 2016-08-11 – 2016-08-12 (×2): 200 mg via ORAL
  Filled 2016-08-11 (×2): qty 1

## 2016-08-11 MED ORDER — OXYCODONE HCL 5 MG PO TABS
5.0000 mg | ORAL_TABLET | ORAL | Status: DC | PRN
  Administered 2016-08-11 – 2016-08-12 (×4): 5 mg via ORAL
  Filled 2016-08-11 (×4): qty 1

## 2016-08-11 MED ORDER — MORPHINE SULFATE (PF) 4 MG/ML IV SOLN
4.0000 mg | Freq: Once | INTRAVENOUS | Status: AC
  Administered 2016-08-11: 4 mg via INTRAVENOUS
  Filled 2016-08-11: qty 1

## 2016-08-11 MED ORDER — ONDANSETRON HCL 4 MG/2ML IJ SOLN: 4.0000 mg | Freq: Four times a day (QID) | INTRAMUSCULAR | Status: DC | PRN

## 2016-08-11 MED ORDER — MORPHINE SULFATE (PF) 2 MG/ML IV SOLN
2.0000 mg | Freq: Once | INTRAVENOUS | Status: AC
  Administered 2016-08-11: 2 mg via INTRAVENOUS
  Filled 2016-08-11: qty 1

## 2016-08-11 MED ORDER — OXYCODONE HCL 5 MG PO TABS
5.0000 mg | ORAL_TABLET | Freq: Once | ORAL | Status: AC
  Administered 2016-08-11: 5 mg via ORAL
  Filled 2016-08-11: qty 1

## 2016-08-11 MED ORDER — PREDNISONE 20 MG PO TABS
20.0000 mg | ORAL_TABLET | Freq: Once | ORAL | Status: AC
  Administered 2016-08-11: 20 mg via ORAL
  Filled 2016-08-11: qty 1

## 2016-08-11 MED ORDER — ACETAMINOPHEN 325 MG PO TABS
650.0000 mg | ORAL_TABLET | Freq: Four times a day (QID) | ORAL | Status: DC | PRN
  Administered 2016-08-11: 650 mg via ORAL
  Filled 2016-08-11: qty 2

## 2016-08-11 MED ORDER — FOLIC ACID 1 MG PO TABS
1.0000 mg | ORAL_TABLET | Freq: Every day | ORAL | Status: DC
  Administered 2016-08-12: 1 mg via ORAL
  Filled 2016-08-11: qty 1

## 2016-08-11 MED ORDER — SERTRALINE HCL 50 MG PO TABS
25.0000 mg | ORAL_TABLET | Freq: Every day | ORAL | Status: DC
  Administered 2016-08-12: 25 mg via ORAL
  Filled 2016-08-11: qty 1

## 2016-08-11 MED ORDER — ACETAMINOPHEN 650 MG RE SUPP: 650.0000 mg | Freq: Four times a day (QID) | RECTAL | Status: DC | PRN

## 2016-08-11 MED ORDER — PANTOPRAZOLE SODIUM 40 MG IV SOLR
40.0000 mg | Freq: Two times a day (BID) | INTRAVENOUS | Status: DC
  Administered 2016-08-11 – 2016-08-12 (×2): 40 mg via INTRAVENOUS
  Filled 2016-08-11 (×2): qty 40

## 2016-08-11 MED ORDER — GI COCKTAIL ~~LOC~~
30.0000 mL | Freq: Once | ORAL | Status: AC
  Administered 2016-08-11: 30 mL via ORAL
  Filled 2016-08-11: qty 30

## 2016-08-11 NOTE — Discharge Instructions (Signed)
You were evaluated for upper abdominal pain, and as we discussed, I do suspect you might have stomach ulcers, however no evidence of emergency medical complication at this point in time.  As we discussed, because of your lupus and pericarditis, it is very difficult to manage the risk versus benefit of each medication treatment, but for now continue your medications. I am adding Carafate to help her stomach. You do need to follow up with GI doctor. You may try over-the-counter Maalox, use as directed on labeling to neutralize acid in for immediate relief.  Return to the emergency department immediately for any worsening symptoms, including any worsening chest pain, or trouble breathing, dizziness or passing out, or for worsening abdominal pain, any vomiting blood, any black or bloody stools.

## 2016-08-11 NOTE — ED Provider Notes (Addendum)
[facility] Emergency Department Provider Note ____________________________________________   I have reviewed the triage vital signs and the triage nursing note.  HISTORY  Chief Complaint Emesis and Abdominal Pain   Historian Patient and mother at the bedside  HPI Molly Perkins is a 26 y.o. 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 She states that she thinks that she has ulcers, and that she has been diagnosed with ulcers in the past although does not follow with a GI doctor currently.  No black or bloody stools. No bloody vomiting. Pain is in the epigastrium. The pain feels different than the left-sided cardiac pain that she feels during pericarditis flare. She still has occasional pericarditis/cardiac-type pains, but the pains today are not that type.  No congestion or respiratory distress. No shortness of breath. No pleuritic chest pain. No fever.  She is supposed to make it in with her PCP next week.    Past Medical History:  Diagnosis Date  . Arthritis   . Asthma   . Depressed   . Fibromyalgia   . GERD (gastroesophageal reflux disease)   . Lupus   . Opiate abuse, continuous   . PTSD (post-traumatic stress disorder)     Patient Active Problem List   Diagnosis Date Noted  . 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 Fibromyalgia 10/19/2012  . Neuropathic pain of hand 10/19/2012  . Marijuana dependence (HCC) 05/04/2012    Class: Chronic    Past Surgical History:  Procedure Laterality Date  . NO PAST SURGERIES      Prior to Admission medications   Medication Sig Start Date End Date Taking? Authorizing Provider  aspirin 81 MG tablet Take 325 mg by mouth daily.    Yes Historical Provider, MD  colchicine 0.6 MG tablet Take 0.6 mg by mouth daily. 11/09/15  Yes Historical Provider,  MD  ferrous sulfate 325 (65 FE) MG tablet Take 1 tablet (325 mg total) by mouth daily. 09/11/15  Yes Gwyneth Sprout, MD  FOLIC ACID PO Take by mouth.   Yes Historical Provider, MD  hydroxychloroquine (PLAQUENIL) 200 MG tablet Take 200 mg by mouth 2 (two) times daily. 11/12/15  Yes Historical Provider, MD  INDOMETHACIN PO Take 50 mg by mouth daily.    Yes Historical Provider, MD  METHOTREXATE PO Take by mouth.   Yes Historical Provider, MD  pantoprazole (PROTONIX) 20 MG tablet Take 20 mg by mouth 2 (two) times daily.   Yes Historical Provider, MD  sertraline (ZOLOFT) 25 MG tablet Take 25 mg by mouth daily.   Yes Historical Provider, MD  benzonatate (TESSALON) 100 MG capsule Take 1 capsule (100 mg total) by mouth 3 (three) times daily as needed for cough. Patient not taking: Reported on 08/11/2016 06/10/16   Laurence Spates, MD  cyclobenzaprine (FLEXERIL) 10 MG tablet Take 1 tablet (10 mg total) by mouth 2 (two) times daily as needed for muscle spasms. Patient not taking: Reported on 08/11/2016 09/11/15   Gwyneth Sprout, MD  dextromethorphan-guaiFENesin [facility] DM) 30-600 MG 12hr tablet Take 1 tablet by mouth 2 (two) times daily as needed for cough. Patient not taking: Reported on 08/11/2016 06/10/16   Laurence Spates, MD  HYDROcodone-acetaminophen (NORCO/VICODIN) 5-325 MG tablet Take 1-2 tablets by mouth every 6 (six) hours as needed. Patient not taking: Reported on 08/11/2016 09/11/15   Gwyneth Sprout,  MD  ranitidine (ZANTAC) 150 MG tablet Take 1 tablet (150 mg total) by mouth 2 (two) times daily. Patient not taking: Reported on 08/11/2016 11/21/15   Sofie RowerMike Stengel, MD  sucralfate (CARAFATE) 1 g tablet Take 1 tablet (1 g total) by mouth 4 (four) times daily. 08/11/16 08/11/17  Governor Rooksebecca Candida Vetter, MD    Allergies  Allergen Reactions  . Bee Venom Anaphylaxis    Has epi-pen    History reviewed. No pertinent family history.  Social History Social History  Substance Use Topics  . Smoking  status: Former Smoker    Packs/day: 0.50    Years: 3.00    Types: Cigarettes    Quit date: 02/16/2014  . Smokeless tobacco: Never Used  . Alcohol use No     Comment: denies current use    Review of Systems  Constitutional: Negative for fever. Eyes: Negative for visual changes. ENT: Negative for sore throat. Cardiovascular: Chronic intermittent left sided chest pain, not pleuritic. Respiratory: Negative for shortness of breath. Gastrointestinal: Positive for upper abdominal pain.   Genitourinary: Negative for dysuria. Musculoskeletal: Negative for back pain. Skin: Negative for rash. Neurological: Negative for headache. 10 point Review of Systems otherwise negative ____________________________________________   PHYSICAL EXAM:  VITAL SIGNS: ED Triage Vitals [08/11/16 1009]  Enc Vitals Group     BP 111/83     Pulse Rate 82     Resp 18     Temp 97.9 F (36.6 C)     Temp Source Oral     SpO2 100 %     Weight 150 lb (68 kg)     Height 5\' 5"  (1.651 m)     Head Circumference      Peak Flow      Pain Score 8     Pain Loc      Pain Edu?      Excl. in GC?      Constitutional: Alert and oriented. Well appearing and in no distress. HEENT   Head: Normocephalic and atraumatic.      Eyes: Conjunctivae are normal. PERRL. Normal extraocular movements.      Ears:         Nose: No congestion/rhinnorhea.   Mouth/Throat: Mucous membranes are moist.   Neck: No stridor. Cardiovascular/Chest: Normal rate, regular rhythm.  No murmurs, rubs, or gallops. Respiratory: Normal respiratory effort without tachypnea nor retractions. Breath sounds are clear and equal bilaterally. No wheezes/rales/rhonchi. Gastrointestinal: Soft. No distention, no guarding, no rebound.  Mild-moderate upper abdominal pain to palpation.  No lower abdominal pain.  Genitourinary/rectal:Deferred Musculoskeletal: Nontender with normal range of motion in all extremities. No joint effusions.  No lower  extremity tenderness.  No edema. Neurologic:  Normal speech and language. No gross or focal neurologic deficits are appreciated. Skin:  Skin is warm, dry and intact. No rash noted. Psychiatric: Mood and affect are normal. Speech and behavior are normal. Patient exhibits appropriate insight and judgment.   ____________________________________________  LABS (pertinent positives/negatives)  Labs Reviewed  CBC - Abnormal; Notable for the following:       Result Value   RDW 17.7 (*)    All other components within normal limits  LIPASE, BLOOD  COMPREHENSIVE METABOLIC PANEL  TROPONIN I  URINALYSIS, COMPLETE (UACMP) WITH MICROSCOPIC    ____________________________________________    EKG I, Governor Rooksebecca Aiden Helzer, MD, the attending physician have personally viewed and interpreted all ECGs.  83 bpm. Normal sinus rhythm. Narrow QRS. Normal axis. Monocytic elevation in II, III, and F aVF with normal  morphology appearance, minimal elevation in V5 and v6, looks like prior EKG for pericarditis ____________________________________________  RADIOLOGY All Xrays were viewed by me. Imaging interpreted by Radiologist.  Chest x-ray two-view: No active cardiopulmonary disease. __________________________________________  PROCEDURES  Procedure(s) performed: None  Critical Care performed: None  ____________________________________________   ED COURSE / ASSESSMENT AND PLAN  Pertinent labs & imaging results that were available during my care of the patient were reviewed by me and considered in my medical decision making (see chart for details).  Ms. Allers came in with her mom, with symptoms of epigastric pain consistent with prior GERD and prior diagnosis of stomach ulcers. No symptoms concerning for perforated ulcer, or bleeding ulcer.  Patient is quite certain that her pain is coming from her stomach rather than her chest in the known pericarditis.  She's on indomethacin for the pericarditis from  several months ago, I spoke with patient and mom about changing to prednisone, given gi symptoms, however they stated that prednisone did not work for her previously, and the only thing that will work for the pericarditis is indomethacin. They really do not want to stop it. I think without her having some active bleeding, I guess risk versus benefit is okay to continue, unless there are worsening symptoms and I discussed this at length with them.  Ultimately, she does need to get referred to GI. No emergency condition for admission in terms of these symptoms today.  She is supposed to see her primary care doctor next week. They are in the process of changing different rheumatologists, states they're going to see [facility] for that.  We discussed return precautions with regard to both epigastric but also any other concerning symptoms.    CONSULTATIONS:  Unassigned cardiology Dr. Gwen Pounds, recommends consultation for uncontrolled pain, on echocardiogram for further evaluation of potential pericardial effusion and does comment that the patient's with lupus, they inflammatory condition may make resolution of pericarditis quite difficult.  Hospitalist for admission.   Patient / Family / Caregiver informed of clinical course, medical decision-making process, and agree with plan.   I discussed return precautions, follow-up instructions, and discharge instructions with patient and/or family.   Addended to include when I went back to check in on the patient, she was clutching the left side of her chest and fine with pain. Patient and mom state that these are the episode she experiences with severe pericarditis which are coming more frequently in the past week or so. Mom says she cannot take her home in this condition, and I agree.  Repeat EKG was obtained and reassuring. She is given IV pain medication for symptomatic relief.  Given persistent symptoms of epigastric  pain as well as now chest pain/pericarditis, discussed with the hospitalist for admission.  ___________________________________________   FINAL CLINICAL IMPRESSION(S) / ED DIAGNOSES   Final diagnoses:  Epigastric pain  Gastroesophageal reflux disease, esophagitis presence not specified  Other acute pericarditis  Chest pain in adult  Pericarditis, unspecified chronicity, unspecified type              Note: This dictation was prepared with Dragon dictation. Any transcriptional errors that result from this process are unintentional    Governor Rooks, MD 08/11/16 1258    Governor Rooks, MD 08/11/16 [phone]

## 2016-08-11 NOTE — ED Triage Notes (Signed)
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

## 2016-08-11 NOTE — H&P (Signed)
[facility] [place] [facility]   PATIENT NAME: Molly Perkins    MR#:  [id number]  DATE OF BIRTH:  12/31/1990  DATE OF ADMISSION:  08/11/2016  PRIMARY CARE PHYSICIAN: Verlon Au, MD   REQUESTING/REFERRING PHYSICIAN: Dr Governor Rooks  CHIEF COMPLAINT:   Chief Complaint  Patient presents with  . Emesis  . Abdominal Pain    HISTORY OF PRESENT ILLNESS:  Molly Perkins  is a 26 y.o. female with a known history of Lupus and she states that she's going through a flare. Her skin is hurting to the touch. She had a cold sweat this morning. She felt like she was going to vomit. She ended up vomiting 2 today. She thinks her ulcers are acting up. She is having sharp chest pains and a fluttering in her chest. Her cardiologist as outpatient put on a monitor and that was sent off for evaluation. Chest pains are described as sharp and severe in nature. The patient states that occasionally she has black stools but she does take iron. She also has takes indomethacin and aspirin. ER physician asked for an observation for the patient because of her history of pericarditis. I advised the patient I do not have any gastrointestinal coverage for the next 2 days in the hospital, so I cannot get an endoscopy while she is here.  PAST MEDICAL HISTORY:   Past Medical History:  Diagnosis Date  . Arthritis   . Asthma   . Depressed   . Fibromyalgia   . GERD (gastroesophageal reflux disease)   . Lupus   . Opiate abuse, continuous   . PTSD (post-traumatic stress disorder)     PAST SURGICAL HISTORY:   Past Surgical History:  Procedure Laterality Date  . NO PAST SURGERIES      SOCIAL HISTORY:   Social History  Substance Use Topics  . Smoking status: Former Smoker    Packs/day: 0.50    Years: 3.00    Types: Cigarettes    Quit date: 02/16/2014  . Smokeless tobacco: Never Used  . Alcohol use No     Comment: denies current use    FAMILY HISTORY:   Family History   Problem Relation Age of Onset  . Fibromyalgia Mother     DRUG ALLERGIES:   Allergies  Allergen Reactions  . Bee Venom Anaphylaxis    Has epi-pen    REVIEW OF SYSTEMS:  CONSTITUTIONAL: No fever, Positive for fatigue. Positive for cold sweats EYES: No blurred or double vision.  EARS, NOSE, AND THROAT: No tinnitus or ear pain. No sore throat RESPIRATORY: No cough, shortness of breath, wheezing or hemoptysis.  CARDIOVASCULAR: Positive for chest pain, no orthopnea, edema.  GASTROINTESTINAL: Positive for nausea and vomiting, no diarrhea. Positive for abdominal pain. Occasional black stools GENITOURINARY: No dysuria, hematuria.  ENDOCRINE: No polyuria, nocturia,  HEMATOLOGY: History of anemia, easy bruising or bleeding SKIN: Does have skin lesions with lupus MUSCULOSKELETAL: Positive for joint pain   NEUROLOGIC: No tingling, numbness, weakness.  PSYCHIATRY: History of anxiety and depression.   MEDICATIONS AT HOME:   Prior to Admission medications   Medication Sig Start Date End Date Taking? Authorizing Provider  aspirin 81 MG tablet Take 325 mg by mouth daily.    Yes Historical Provider, MD  colchicine 0.6 MG tablet Take 0.6 mg by mouth daily. 11/09/15  Yes Historical Provider, MD  ferrous sulfate 325 (65 FE) MG tablet Take 1 tablet (325 mg total) by mouth daily. 09/11/15  Yes Whitney  Plunkett, MD  FOLIC ACID PO Take by mouth.   Yes Historical Provider, MD  hydroxychloroquine (PLAQUENIL) 200 MG tablet Take 200 mg by mouth 2 (two) times daily. 11/12/15  Yes Historical Provider, MD  INDOMETHACIN PO Take 50 mg by mouth daily.    Yes Historical Provider, MD  METHOTREXATE PO Take by mouth.   Yes Historical Provider, MD  pantoprazole (PROTONIX) 20 MG tablet Take 20 mg by mouth 2 (two) times daily.   Yes Historical Provider, MD  sertraline (ZOLOFT) 25 MG tablet Take 25 mg by mouth daily.   Yes Historical Provider, MD  sucralfate (CARAFATE) 1 g tablet Take 1 tablet (1 g total) by mouth 4  (four) times daily. 08/11/16 08/11/17  Governor Rooks, MD      VITAL SIGNS:  Blood pressure 115/62, pulse 89, temperature 97.9 F (36.6 C), temperature source Oral, resp. rate 14, height 5\' 5"  (1.651 m), weight 68 kg (150 lb), last menstrual period 08/03/2016, SpO2 99 %.  PHYSICAL EXAMINATION:  GENERAL:  26 y.o.-year-old patient lying in the bed with no acute distress.  EYES: Pupils equal, round, reactive to light and accommodation. No scleral icterus. Extraocular muscles intact.  HEENT: Head atraumatic, normocephalic. Oropharynx and nasopharynx clear.  NECK:  Supple, no jugular venous distention. No thyroid enlargement, no tenderness.  LUNGS: Normal breath sounds bilaterally, no wheezing, rales,rhonchi or crepitation. No use of accessory muscles of respiration.  CARDIOVASCULAR: S1, S2 normal. No murmurs, rubs, or gallops.  ABDOMEN: Soft, nontender, nondistended. Bowel sounds present. No organomegaly or mass.  EXTREMITIES: No pedal edema, cyanosis, or clubbing.  NEUROLOGIC: Cranial nerves II through XII are intact. Muscle strength 5/5 in all extremities. Sensation intact. Gait not checked.  PSYCHIATRIC: The patient is alert and oriented x 3.  SKIN: Old healing lesions seen on her legs   LABORATORY PANEL:   CBC  Recent Labs Lab 08/11/16 1025  WBC 7.0  HGB 12.7  HCT 38.5  PLT 311   ------------------------------------------------------------------------------------------------------------------  Chemistries   Recent Labs Lab 08/11/16 1025  NA 136  K 4.2  CL 103  CO2 26  GLUCOSE 89  BUN 13  CREATININE 0.94  CALCIUM 9.0  AST 26  ALT 14  ALKPHOS 53  BILITOT 0.7   ------------------------------------------------------------------------------------------------------------------  Cardiac Enzymes  Recent Labs Lab 08/11/16 1025  TROPONINI <0.03    ------------------------------------------------------------------------------------------------------------------  RADIOLOGY:  Dg Chest 2 View  Result Date: 08/11/2016 CLINICAL DATA:  Vomiting for 2 days, chest pain, asthma EXAM: CHEST  2 VIEW COMPARISON:  Chest x-ray of 06/10/2016 FINDINGS: No active infiltrate or effusion is seen. Mediastinal and hilar contours are unremarkable. The heart is within normal limits in size. No bony abnormality is seen. IMPRESSION: No active cardiopulmonary disease. Electronically Signed   By: Dwyane Dee M.D.   On: 08/11/2016 10:57     IMPRESSION AND PLAN:   1. Chest pain. Patient has a history of pericarditis and takes aspirin and colchicine. I will give 1 dose of prednisone at this time. Obtain an echocardiogram to rule out pericardial effusion. When necessary oral pain medication. 2. History of gastric ulcers. Patient taking indomethacin and aspirin. I will hold both of these medications at this time. Start IV Protonix 40 mg IV twice a day and Carafate. I advised the patient that I do not have gastroenterology coverage here at this hospital over the next 2 days so I cannot get an endoscopy. I will check a couple more hemoglobins and treat medically at this time. Can be referred  to gastroenterology as outpatient. When necessary nausea medication. 3. History of lupus. Continue Plaquenil 4. History of fibromyalgia and depression on Zoloft  All the records are reviewed and case discussed with ED provider. Management plans discussed with the patient, family and they are in agreement.  CODE STATUS: Full code  TOTAL TIME TAKING CARE OF THIS PATIENT: 50 minutes.    Alford HighlandWIETING, Paeton Studer M.D on 08/11/2016 at 3:36 PM  Between 7am to 6pm - Pager - [phone]  After 6pm call admission pager [phone]  [employer] Office  [phone]  CC: Primary care physician; Verlon AuBoyd, Tammy Lamonica, MD

## 2016-08-12 ENCOUNTER — Other Ambulatory Visit: Payer: Self-pay | Admitting: *Deleted

## 2016-08-12 LAB — ECHOCARDIOGRAM COMPLETE
Height: 65 in
Weight: 2336 oz

## 2016-08-12 LAB — CBC
HCT: 36.8 % (ref 35.0–47.0)
Hemoglobin: 12 g/dL (ref 12.0–16.0)
MCH: 26.4 pg (ref 26.0–34.0)
MCHC: 32.5 g/dL (ref 32.0–36.0)
MCV: 81.3 fL (ref 80.0–100.0)
Platelets: 304 10*3/uL (ref 150–440)
RBC: 4.52 MIL/uL (ref 3.80–5.20)
RDW: 17.7 % — AB (ref 11.5–14.5)
WBC: 6 10*3/uL (ref 3.6–11.0)

## 2016-08-12 LAB — BASIC METABOLIC PANEL
Anion gap: 7 (ref 5–15)
BUN: 9 mg/dL (ref 6–20)
CHLORIDE: 105 mmol/L (ref 101–111)
CO2: 24 mmol/L (ref 22–32)
Calcium: 8.6 mg/dL — ABNORMAL LOW (ref 8.9–10.3)
Creatinine, Ser: 0.74 mg/dL (ref 0.44–1.00)
GFR calc non Af Amer: >60 mL/min (ref >60)
Glucose, Bld: 116 mg/dL — ABNORMAL HIGH (ref 65–99)
POTASSIUM: 4.2 mmol/L (ref 3.5–5.1)
SODIUM: 136 mmol/L (ref 135–145)

## 2016-08-12 NOTE — Consult Note (Signed)
[facility] CLINIC CARDIOLOGY [facility]  CARDIOLOGY CONSULT NOTE  Patient ID: Molly Perkins MRN: [id number] DOB/AGE: [birth year] 26 y.o.  Admit date: 08/11/2016 Referring Physician [facility] Primary Physician Dr. Dion Body Primary Cardiologist [facility] Reason for Consultation pericarditis  HPI: 26 yo female with history of lupus and pericarditisWho [place] in [place] who presented with complaints of chest pain. EKG shows diffuse ST elevation consistent with pericarditis. It is unchanged from her electrocardiogram done as an outpatient. She has been treated chronically with prednisone, colchicine, narcotics for her pain. She states that her chest constantly hurts. Cardiac markers are negative. Echocardiogram done several months ago at [facility] showed preserved LV function with a trivial pericardial effusion. Echocardiogram is currently pending. Stable  Review of Systems  Constitutional: Negative.   HENT: Negative.   Eyes: Negative.   Respiratory: Negative.   Cardiovascular: Positive for chest pain.  Gastrointestinal: Negative.   Genitourinary: Negative.   Musculoskeletal: Positive for joint pain and myalgias.  Skin: Negative.   Neurological: Negative.   Endo/Heme/Allergies: Negative.   Psychiatric/Behavioral: Negative.     Past Medical History:  Diagnosis Date  . Arthritis   . Asthma   . Depressed   . Fibromyalgia   . GERD (gastroesophageal reflux disease)   . History of stomach ulcers   . Lupus   . Opiate abuse, continuous   . PTSD (post-traumatic stress disorder)     Family History  Problem Relation Age of Onset  . Fibromyalgia Mother     Social History   Social History  . Marital status: Single    Spouse name: N/A  . Number of children: N/A  . Years of education: N/A   Occupational History  . Not on file.   Social History Main Topics  . Smoking status: Former Smoker    Packs/day: 0.50    Years: 3.00     Types: Cigarettes    Quit date: 02/16/2014  . Smokeless tobacco: Never Used  . Alcohol use No     Comment: denies current use  . Drug use: No     Comment: denies current use  . Sexual activity: No     Comment: only with females   Other Topics Concern  . Not on file   Social History Narrative  . No narrative on file    Past Surgical History:  Procedure Laterality Date  . NO PAST SURGERIES       Prescriptions Prior to Admission  Medication Sig Dispense Refill Last Dose  . aspirin 81 MG tablet Take 325 mg by mouth daily.    08/10/2016 at 1800  . colchicine 0.6 MG tablet Take 0.6 mg by mouth daily.   08/10/2016 at 1800  . ferrous sulfate 325 (65 FE) MG tablet Take 1 tablet (325 mg total) by mouth daily. 30 tablet 0 08/10/2016 at 1800  . folic acid (FOLVITE) 1 MG tablet Take 1 mg by mouth daily.    08/10/2016 at 1800  . hydroxychloroquine (PLAQUENIL) 200 MG tablet Take 200 mg by mouth 2 (two) times daily.  0 08/10/2016 at 1800  . indomethacin (INDOCIN) 25 MG capsule Take 25 mg by mouth 2 (two) times daily.    08/10/2016 at 1800  . methotrexate (RHEUMATREX) 2.5 MG tablet Take 10 mg by mouth once a week. Caution:Chemotherapy. Protect from light.   08/10/2016 at 1800  . pantoprazole (PROTONIX) 20 MG tablet Take 20 mg by mouth 2 (two) times daily.   08/10/2016 at  1800  . sertraline (ZOLOFT) 25 MG tablet Take 25 mg by mouth daily.   08/10/2016 at 1800    Physical Exam: Blood pressure (!) 99/47, pulse 85, temperature 98.2 F (36.8 C), temperature source Oral, resp. rate 17, height 5\' 5"  (1.651 m), weight 146 lb (66.2 kg), last menstrual period 08/03/2016, SpO2 98 %.   Wt Readings from Last 1 Encounters:  08/11/16 146 lb (66.2 kg)     General appearance: uncooperative Resp: clear to auscultation bilaterally Chest wall: right sided chest wall tenderness, left sided chest wall tenderness Cardio: regular rate and rhythm GI: soft, non-tender; bowel sounds normal; no masses,  no  organomegaly Extremities: extremities normal, atraumatic, no cyanosis or edema Neurologic: Grossly normal  Labs:   Lab Results  Component Value Date   WBC 6.0 08/12/2016   HGB 12.0 08/12/2016   HCT 36.8 08/12/2016   MCV 81.3 08/12/2016   PLT 304 08/12/2016    Recent Labs Lab 08/11/16 1025 08/12/16 0458  NA 136 136  K 4.2 4.2  CL 103 105  CO2 26 24  BUN 13 9  CREATININE 0.94 0.74  CALCIUM 9.0 8.6*  PROT 7.1  --   BILITOT 0.7  --   ALKPHOS 53  --   ALT 14  --   AST 26  --   GLUCOSE 89 116*   Lab Results  Component Value Date   CKTOTAL 58 06/04/2015   TROPONINI <0.03 08/11/2016      Radiology: No active cardiopulmonary disease EKG: Sinus rhythm with diffuse ST elevation consistent with pericarditis and consistent with her previous electrocardiogram  ASSESSMENT AND PLAN:  26 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 Cardiac markers are normal. EKG is unchanged from previously. Pain is consistent with pericarditis. She is currently on colchicine, steroids and narcotics for the pain. On my exam patient did not give any history other than asking where her pain medication was. Echocardiogram is pending to evaluate for pericardial effusion although clinically patient does not have evidence of tamponade physiology. We continue to treat with current pain medications. We'll review echocardiogram when available.  Signed: Dalia HeadingKenneth A Manasi Dishon MD, [facility] Center[facility]ACC 08/12/2016, 8:47 AM

## 2016-08-12 NOTE — Discharge Summary (Signed)
[facility] [employer] [facility] [place] [facility]   PATIENT NAME: Molly Perkins    MR#:  [id number]  DATE OF BIRTH:  1990/07/23  DATE OF ADMISSION:  08/11/2016 ADMITTING PHYSICIAN: Alford Highlandichard Wieting, MD  DATE OF DISCHARGE: 08/12/2016  PRIMARY CARE PHYSICIAN: Verlon AuBoyd, Tammy Lamonica, MD    ADMISSION DIAGNOSIS:  Other acute pericarditis [I30.8] Epigastric pain [R10.13] Gastroesophageal reflux disease, esophagitis presence not specified [K21.9] Chest pain in adult [R07.9] Pericarditis, unspecified chronicity, unspecified type [I31.9]  DISCHARGE DIAGNOSIS:  Active Problems:   Chest pain   SECONDARY DIAGNOSIS:   Past Medical History:  Diagnosis Date  . Arthritis   . Asthma   . Depressed   . Fibromyalgia   . GERD (gastroesophageal reflux disease)   . History of stomach ulcers   . Lupus   . Opiate abuse, continuous   . PTSD (post-traumatic stress disorder)     HOSPITAL COURSE:  26 y/o female with hx SLE here with chest pain.   1. Chest pain. Patient has a history of pericarditis and takes aspirin and colchicine.  She wanted to continue these medications and see her lupus MD. Her ECHO did not show large pericardial effusion. Troponins were negative.  2. History of gastric ulcers. Patient is taking indomethacin and aspirin and did nto want to stop these medications. she will continue PPI BID.Her hemoglobin remanined stable.  3. History of lupus. Continue Plaquenil 4. History of fibromyalgia and depression on Zoloft   DISCHARGE CONDITIONS AND DIET:   Stable Cardiac diet  CONSULTS OBTAINED:  Treatment Team:  Lamar BlinksBruce J Kowalski, MD Dalia HeadingKenneth A Fath, MD  DRUG ALLERGIES:   Allergies  Allergen Reactions  . Bee Venom Anaphylaxis    Has epi-pen    DISCHARGE MEDICATIONS:   Current Discharge Medication List    START taking these medications   Details  sucralfate (CARAFATE) 1 g tablet Take 1 tablet (1 g total) by mouth 4 (four) times daily. Qty: 120 tablet, Refills:  0      CONTINUE these medications which have NOT CHANGED   Details  aspirin 81 MG tablet Take 325 mg by mouth daily.     colchicine 0.6 MG tablet Take 0.6 mg by mouth daily.    ferrous sulfate 325 (65 FE) MG tablet Take 1 tablet (325 mg total) by mouth daily. Qty: 30 tablet, Refills: 0    folic acid (FOLVITE) 1 MG tablet Take 1 mg by mouth daily.     hydroxychloroquine (PLAQUENIL) 200 MG tablet Take 200 mg by mouth 2 (two) times daily. Refills: 0    indomethacin (INDOCIN) 25 MG capsule Take 25 mg by mouth 2 (two) times daily.     methotrexate (RHEUMATREX) 2.5 MG tablet Take 10 mg by mouth once a week. Caution:Chemotherapy. Protect from light.    pantoprazole (PROTONIX) 20 MG tablet Take 20 mg by mouth 2 (two) times daily.    sertraline (ZOLOFT) 25 MG tablet Take 25 mg by mouth daily.              Today   CHIEF COMPLAINT:  Has chronic chest pain   VITAL SIGNS:  Blood pressure 109/66, pulse 88, temperature 98 F (36.7 C), temperature source Oral, resp. rate 20, height 5\' 5"  (1.651 m), weight 66.2 kg (146 lb), last menstrual period 08/03/2016, SpO2 100 %.   REVIEW OF SYSTEMS:  Review of Systems  Constitutional: Negative.  Negative for chills, fever and malaise/fatigue.  HENT: Negative.  Negative for ear discharge, ear pain, hearing loss, nosebleeds  and sore throat.   Eyes: Negative.  Negative for blurred vision and pain.  Respiratory: Negative.  Negative for cough, hemoptysis, shortness of breath and wheezing.   Cardiovascular: Positive for chest pain (chronic). Negative for palpitations and leg swelling.  Gastrointestinal: Negative.  Negative for abdominal pain, blood in stool, diarrhea, nausea and vomiting.  Genitourinary: Negative.  Negative for dysuria.  Musculoskeletal: Negative.  Negative for back pain.  Skin: Negative.   Neurological: Negative for dizziness, tremors, speech change, focal weakness, seizures and headaches.  Endo/Heme/Allergies: Negative.   Does not bruise/bleed easily.  Psychiatric/Behavioral: Negative.  Negative for depression, hallucinations and suicidal ideas.     PHYSICAL EXAMINATION:  GENERAL:  26 y.o.-year-old patient lying in the bed with no acute distress.  NECK:  Supple, no jugular venous distention. No thyroid enlargement, no tenderness.  LUNGS: Normal breath sounds bilaterally, no wheezing, rales,rhonchi  No use of accessory muscles of respiration.  CARDIOVASCULAR: S1, S2 normal. No murmurs, rubs, or gallops.  ABDOMEN: Soft, non-tender, non-distended. Bowel sounds present. No organomegaly or mass.  EXTREMITIES: No pedal edema, cyanosis, or clubbing.  PSYCHIATRIC: The patient is alert and oriented x 3.  SKIN: No obvious rash, lesion, or ulcer.   DATA REVIEW:   CBC  Recent Labs Lab 08/12/16 0458  WBC 6.0  HGB 12.0  HCT 36.8  PLT 304    Chemistries   Recent Labs Lab 08/11/16 1025 08/12/16 0458  NA 136 136  K 4.2 4.2  CL 103 105  CO2 26 24  GLUCOSE 89 116*  BUN 13 9  CREATININE 0.94 0.74  CALCIUM 9.0 8.6*  AST 26  --   ALT 14  --   ALKPHOS 53  --   BILITOT 0.7  --     Cardiac Enzymes  Recent Labs Lab 08/11/16 1025 08/11/16 1744 08/11/16 2205  TROPONINI <0.03 <0.03 <0.03    Microbiology Results  @[email or web address] @  RADIOLOGY:  Dg Chest 2 View  Result Date: 08/11/2016 CLINICAL DATA:  Vomiting for 2 days, chest pain, asthma EXAM: CHEST  2 VIEW COMPARISON:  Chest x-ray of 06/10/2016 FINDINGS: No active infiltrate or effusion is seen. Mediastinal and hilar contours are unremarkable. The heart is within normal limits in size. No bony abnormality is seen. IMPRESSION: No active cardiopulmonary disease. Electronically Signed   By: Dwyane Dee M.D.   On: 08/11/2016 10:57      Management plans discussed with the patient and she is in agreement. Stable for discharge home  Patient should follow up with pcp  CODE STATUS:     Code Status Orders        Start     Ordered   08/11/16  1620  Full code  Continuous     08/11/16 1620    Code Status History    Date Active Date Inactive Code Status Order ID Comments User Context   05/15/2015 11:11 PM 05/16/2015  3:51 PM Full Code [id number]  Bethann Berkshire, MD ED   05/02/2012  6:14 AM 05/03/2012  4:57 PM Full Code [id number]  Renne Crigler, PA ED      TOTAL TIME TAKING CARE OF THIS PATIENT: 37 minutes.    Note: This dictation was prepared with Dragon dictation along with smaller phrase technology. Any transcriptional errors that result from this process are unintentional.  Willoughby Doell M.D on 08/12/2016 at 10:31 AM  Between 7am to 6pm - Pager - [phone] After 6pm go to [email or web address] - password [email or web address]  [employer]  [phone]  CC: Primary care physician; Verlon Au, MD

## 2016-08-12 NOTE — Progress Notes (Signed)
Patient discharged to home as ordered. Discharge instructions given to patient and prescription for carafate sent electronically to patients pharmacy. Patient is alert and oriented, no acute distress noted. Vital signs stable. IV discontinued, site clean dry and intact. Telemetry discontinued.

## 2016-08-25 ENCOUNTER — Ambulatory Visit: Payer: Self-pay | Admitting: Gastroenterology

## 2016-11-20 IMAGING — CR DG CHEST 2V
2 series · 2 of 2 positions shown · non-contrast
Comparison: 10/26/2015

CLINICAL DATA: Chest pain for 3 days.

EXAM:
CHEST  2 VIEW

[chest pa]
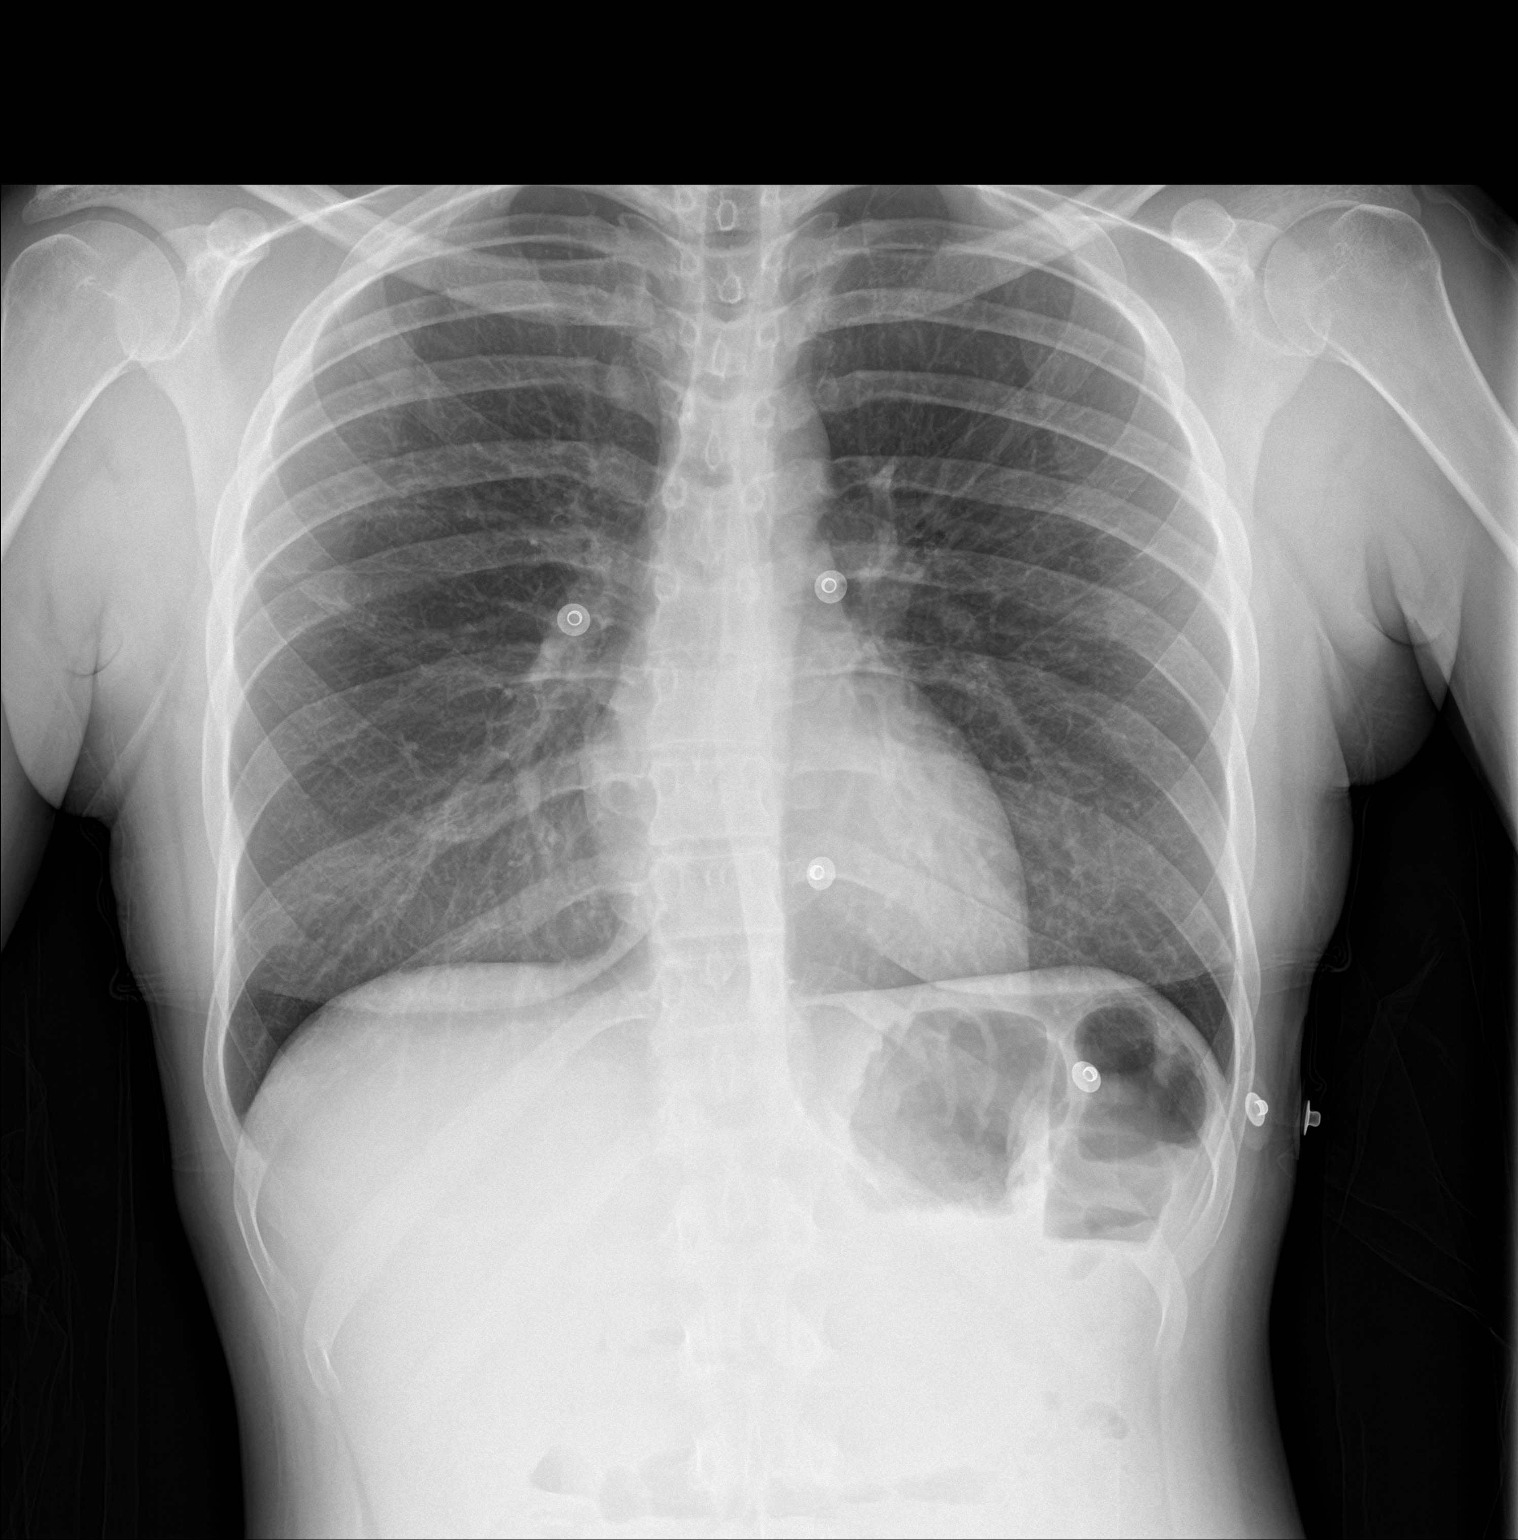

[chest lat]
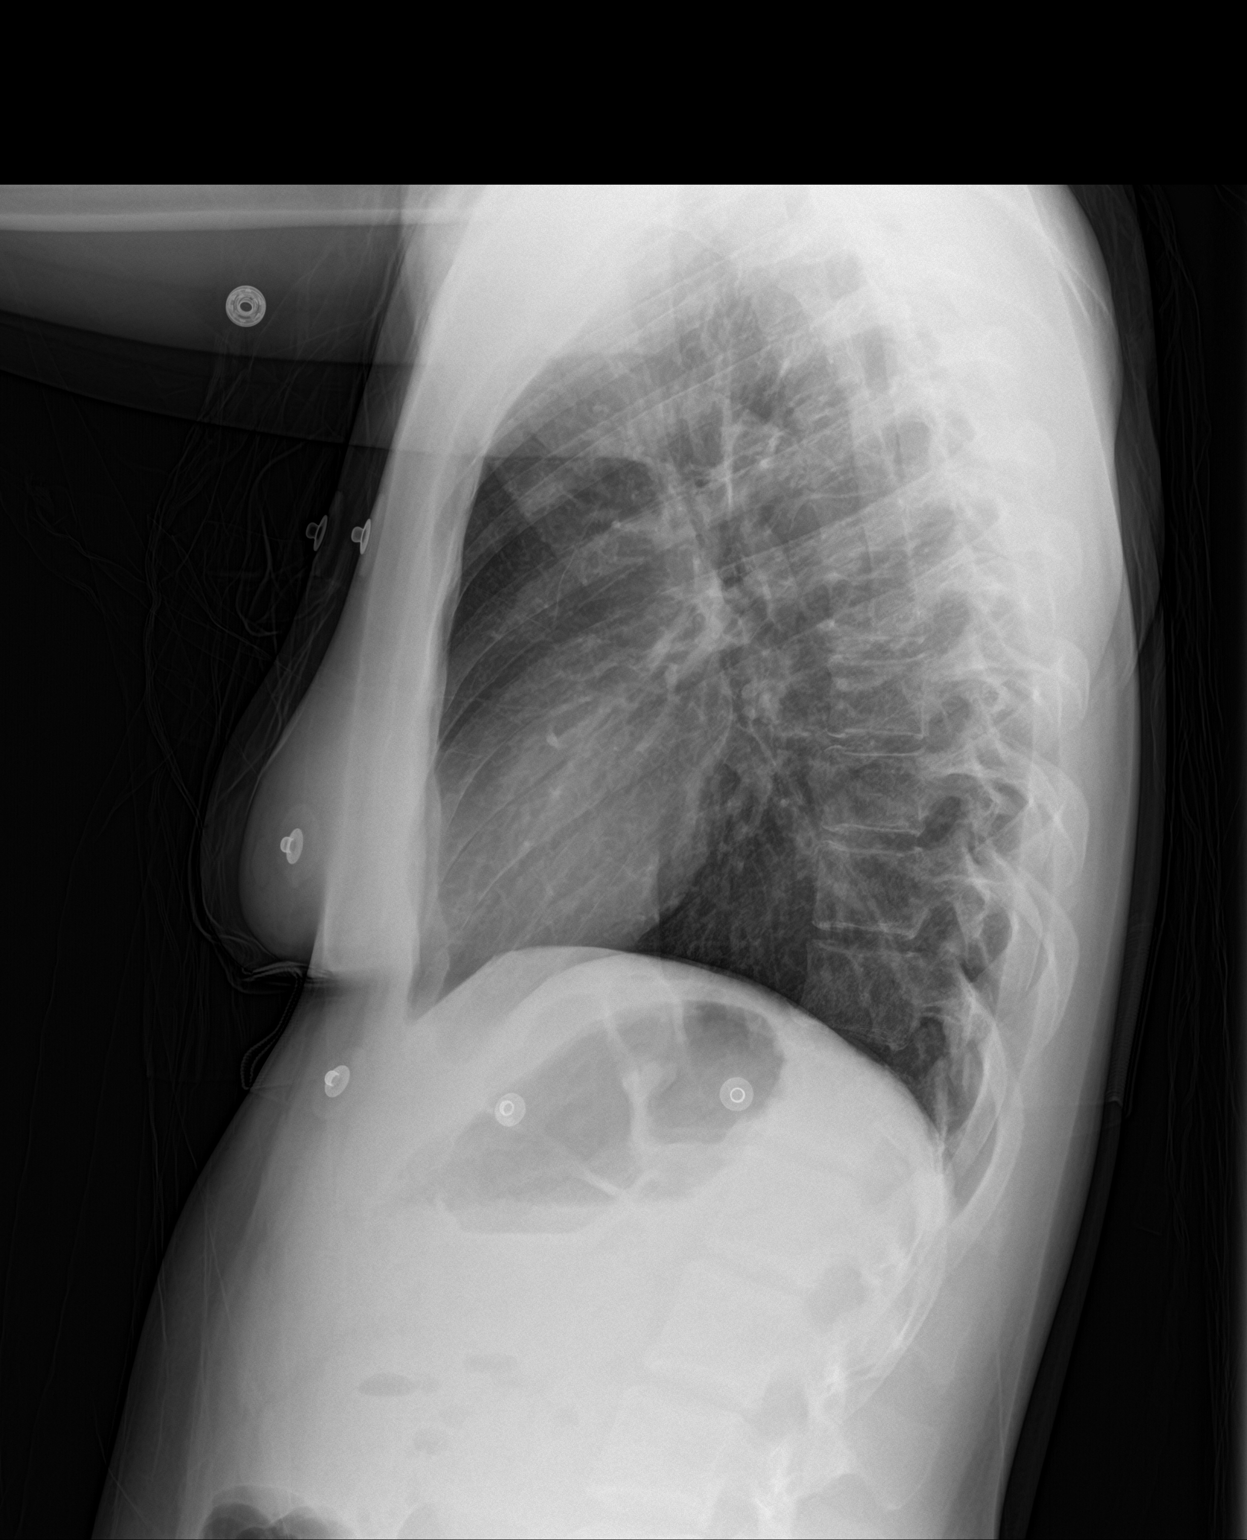

[2 of 2 positions shown; findings below may reference images not displayed]

FINDINGS: The lungs are clear. The pulmonary vasculature is normal. Heart size
is normal. Hilar and mediastinal contours are unremarkable. There is
no pleural effusion.
IMPRESSION: No active cardiopulmonary disease.

## 2017-04-15 ENCOUNTER — Emergency Department (HOSPITAL_BASED_OUTPATIENT_CLINIC_OR_DEPARTMENT_OTHER)
Admission: EM | Admit: 2017-04-15 | Discharge: 2017-04-15 | Disposition: A | Discharge status: Home / Self Care | Payer: Self-pay | Attending: Emergency Medicine | Admitting: Emergency Medicine

## 2017-04-15 ENCOUNTER — Encounter (HOSPITAL_BASED_OUTPATIENT_CLINIC_OR_DEPARTMENT_OTHER): Payer: Self-pay | Admitting: Emergency Medicine

## 2017-04-15 ENCOUNTER — Emergency Department (HOSPITAL_BASED_OUTPATIENT_CLINIC_OR_DEPARTMENT_OTHER): Payer: Self-pay

## 2017-04-15 DIAGNOSIS — J45909 Unspecified asthma, uncomplicated: Secondary | ICD-10-CM | POA: Insufficient documentation

## 2017-04-15 DIAGNOSIS — Z7982 Long term (current) use of aspirin: Secondary | ICD-10-CM | POA: Insufficient documentation

## 2017-04-15 DIAGNOSIS — M329 Systemic lupus erythematosus, unspecified: Secondary | ICD-10-CM | POA: Insufficient documentation

## 2017-04-15 DIAGNOSIS — R0602 Shortness of breath: Secondary | ICD-10-CM

## 2017-04-15 DIAGNOSIS — R6883 Chills (without fever): Secondary | ICD-10-CM

## 2017-04-15 DIAGNOSIS — R52 Pain, unspecified: Secondary | ICD-10-CM

## 2017-04-15 DIAGNOSIS — I309 Acute pericarditis, unspecified: Secondary | ICD-10-CM | POA: Insufficient documentation

## 2017-04-15 DIAGNOSIS — R079 Chest pain, unspecified: Secondary | ICD-10-CM

## 2017-04-15 DIAGNOSIS — Z87891 Personal history of nicotine dependence: Secondary | ICD-10-CM | POA: Insufficient documentation

## 2017-04-15 DIAGNOSIS — Z79899 Other long term (current) drug therapy: Secondary | ICD-10-CM | POA: Insufficient documentation

## 2017-04-15 DIAGNOSIS — I319 Disease of pericardium, unspecified: Secondary | ICD-10-CM

## 2017-04-15 LAB — CBC WITH DIFFERENTIAL/PLATELET
BASOS ABS: 0 10*3/uL (ref 0.0–0.1)
Basophils Relative: 0 %
Eosinophils Absolute: 0.3 10*3/uL (ref 0.0–0.7)
Eosinophils Relative: 4 %
HEMATOCRIT: 35.3 % — AB (ref 36.0–46.0)
HEMOGLOBIN: 11.4 g/dL — AB (ref 12.0–15.0)
LYMPHS ABS: 2.7 10*3/uL (ref 0.7–4.0)
LYMPHS PCT: 36 %
MCH: 26.6 pg (ref 26.0–34.0)
MCHC: 32.3 g/dL (ref 30.0–36.0)
MCV: 82.5 fL (ref 78.0–100.0)
Monocytes Absolute: 0.5 10*3/uL (ref 0.1–1.0)
Monocytes Relative: 7 %
NEUTROS ABS: 4 10*3/uL (ref 1.7–7.7)
NEUTROS PCT: 53 %
PLATELETS: 409 10*3/uL — AB (ref 150–400)
RBC: 4.28 MIL/uL (ref 3.87–5.11)
RDW: 13.7 % (ref 11.5–15.5)
WBC: 7.5 10*3/uL (ref 4.0–10.5)

## 2017-04-15 LAB — COMPREHENSIVE METABOLIC PANEL
ALK PHOS: 51 U/L (ref 38–126)
ALT: 10 U/L — AB (ref 14–54)
AST: 20 U/L (ref 15–41)
Albumin: 3.8 g/dL (ref 3.5–5.0)
Anion gap: 5 (ref 5–15)
BUN: 8 mg/dL (ref 6–20)
CALCIUM: 8.9 mg/dL (ref 8.9–10.3)
CHLORIDE: 108 mmol/L (ref 101–111)
CO2: 22 mmol/L (ref 22–32)
CREATININE: 0.79 mg/dL (ref 0.44–1.00)
GFR calc Af Amer: >60 mL/min (ref >60)
Glucose, Bld: 89 mg/dL (ref 65–99)
Potassium: 3.9 mmol/L (ref 3.5–5.1)
Sodium: 135 mmol/L (ref 135–145)
Total Bilirubin: 0.6 mg/dL (ref 0.3–1.2)
Total Protein: 6.7 g/dL (ref 6.5–8.1)

## 2017-04-15 LAB — URINALYSIS, ROUTINE W REFLEX MICROSCOPIC
Bilirubin Urine: NEGATIVE
Glucose, UA: NEGATIVE mg/dL
Hgb urine dipstick: NEGATIVE
Ketones, ur: NEGATIVE mg/dL
LEUKOCYTES UA: NEGATIVE
NITRITE: NEGATIVE
PROTEIN: NEGATIVE mg/dL
Specific Gravity, Urine: 1.02 (ref 1.005–1.030)
pH: 5.5 (ref 5.0–8.0)

## 2017-04-15 LAB — D-DIMER, QUANTITATIVE: D-Dimer, Quant: 0.4 ug/mL-FEU (ref 0.00–0.50)

## 2017-04-15 LAB — I-STAT CG4 LACTIC ACID, ED: LACTIC ACID, VENOUS: 0.42 mmol/L — AB (ref 0.5–1.9)

## 2017-04-15 LAB — TROPONIN I

## 2017-04-15 LAB — LIPASE, BLOOD: LIPASE: 34 U/L (ref 11–51)

## 2017-04-15 MED ORDER — OXYCODONE-ACETAMINOPHEN 5-325 MG PO TABS: 1.0000 | ORAL_TABLET | ORAL | 0 refills | Status: DC | PRN

## 2017-04-15 MED ORDER — SODIUM CHLORIDE 0.9 % IV BOLUS (SEPSIS)
1000.0000 mL | Freq: Once | INTRAVENOUS | Status: AC
  Administered 2017-04-15: 1000 mL via INTRAVENOUS

## 2017-04-15 MED ORDER — MORPHINE SULFATE (PF) 4 MG/ML IV SOLN
4.0000 mg | Freq: Once | INTRAVENOUS | Status: AC
Start: 2017-04-15 — End: 2017-04-15
  Administered 2017-04-15: 4 mg via INTRAVENOUS
  Filled 2017-04-15: qty 1

## 2017-04-15 MED ORDER — ONDANSETRON HCL 4 MG/2ML IJ SOLN
4.0000 mg | Freq: Once | INTRAMUSCULAR | Status: AC
  Administered 2017-04-15: 4 mg via INTRAVENOUS
  Filled 2017-04-15: qty 2

## 2017-04-15 MED ORDER — COLCHICINE 0.6 MG PO TABS: 0.6000 mg | ORAL_TABLET | Freq: Every day | ORAL | 0 refills | Status: DC

## 2017-04-15 MED ORDER — ONDANSETRON 4 MG PO TBDP: 4.0000 mg | ORAL_TABLET | Freq: Three times a day (TID) | ORAL | 0 refills | Status: DC | PRN

## 2017-04-15 MED ORDER — ONDANSETRON HCL 4 MG PO TABS: 4.0000 mg | ORAL_TABLET | Freq: Three times a day (TID) | ORAL | 0 refills | Status: DC | PRN

## 2017-04-15 MED ORDER — MORPHINE SULFATE (PF) 4 MG/ML IV SOLN
4.0000 mg | Freq: Once | INTRAVENOUS | Status: AC
  Administered 2017-04-15: 4 mg via INTRAVENOUS
  Filled 2017-04-15: qty 1

## 2017-04-15 NOTE — ED Provider Notes (Signed)
[facility] Provider Note   CSN: [id number] Arrival date & time: 04/15/17  1148     History   Chief Complaint Chief Complaint  Patient presents with  . joint pain    HPI Molly Perkins is a 26 y.o. female.  The history is provided by the patient, a parent and medical records.  Chest Pain   This is a recurrent problem. The current episode started yesterday. The problem occurs constantly. The problem has not changed since onset.The pain is associated with exertion, breathing and coughing. The pain is present in the substernal region. The pain is at a severity of 10/10. The pain is severe. The quality of the pain is described as exertional, heavy, pleuritic, pressure-like, sharp, dull and stabbing. The pain does not radiate. The symptoms are aggravated by exertion and deep breathing. Associated symptoms include abdominal pain, cough, exertional chest pressure, malaise/fatigue, nausea, palpitations, shortness of breath and vomiting. Pertinent negatives include no back pain, no diaphoresis, no fever, no headaches, no irregular heartbeat, no leg pain, no lower extremity edema, no near-syncope, no numbness and no syncope. She has tried nothing for the symptoms. The treatment provided no relief.  Pertinent negatives for past medical history include no CAD.    Past Medical History:  Diagnosis Date  . Arthritis   . Asthma   . Depressed   . Fibromyalgia   . GERD (gastroesophageal reflux disease)   . History of stomach ulcers   . Lupus   . Opiate abuse, continuous   . PTSD (post-traumatic stress disorder)     Patient Active Problem List   Diagnosis Date Noted  . Chest pain 08/11/2016  . 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 Fibromyalgia 10/19/2012  . Neuropathic pain of hand 10/19/2012  . Marijuana dependence (HCC) 05/04/2012    Class: Chronic    Past Surgical History:  Procedure Laterality Date  . NO PAST SURGERIES      OB History    Gravida  Para Term Preterm AB Living   0 0 0 0 0 0   SAB TAB Ectopic Multiple Live Births   0 0 0 0         Home Medications    Prior to Admission medications   Medication Sig Start Date End Date Taking? Authorizing Provider  aspirin 81 MG tablet Take 325 mg by mouth daily.     [provider]  colchicine 0.6 MG tablet Take 0.6 mg by mouth daily. 11/09/15   [provider]  ferrous sulfate 325 (65 FE) MG tablet Take 1 tablet (325 mg total) by mouth daily. 09/11/15   Gwyneth Sprout, MD  folic acid (FOLVITE) 1 MG tablet Take 1 mg by mouth daily.     [provider]  hydroxychloroquine (PLAQUENIL) 200 MG tablet Take 200 mg by mouth 2 (two) times daily. 11/12/15   [provider]  hydroxychloroquine (PLAQUENIL) 200 MG tablet Take 200 mg by mouth. 11/12/15   [provider]  indomethacin (INDOCIN) 25 MG capsule Take 25 mg by mouth 2 (two) times daily.     [provider]  methotrexate (RHEUMATREX) 2.5 MG tablet Take 10 mg by mouth once a week. Caution:Chemotherapy. Protect from light.    [provider]  methotrexate (RHEUMATREX) 5 MG tablet Take by mouth.    [provider]  pantoprazole (PROTONIX) 20 MG tablet Take 20 mg by mouth 2 (two) times daily.    [provider]  pantoprazole (PROTONIX) 40 MG  tablet Take 40 mg by mouth. 08/09/16   [provider]  ranitidine (ZANTAC) 75 MG tablet Take 75 mg by mouth. 11/09/15   [provider]  sertraline (ZOLOFT) 25 MG tablet Take 25 mg by mouth daily.    [provider]  sucralfate (CARAFATE) 1 g tablet Take 1 tablet (1 g total) by mouth 4 (four) times daily. 08/11/16 08/11/17  Governor Rooks, MD  traMADol (ULTRAM) 50 MG tablet Take 50 mg by mouth. 06/11/16   [provider]    Family History Family History  Problem Relation Age of Onset  . Fibromyalgia Mother     Social History Social History  Substance Use Topics  . Smoking status:  Former Smoker    Packs/day: 0.50    Years: 3.00    Types: Cigarettes    Quit date: 02/16/2014  . Smokeless tobacco: Never Used  . Alcohol use No     Comment: denies current use     Allergies   Bee venom; Corn-containing products; Fish-derived products; and Soy allergy   Review of Systems Review of Systems  Constitutional: Positive for chills, fatigue and malaise/fatigue. Negative for diaphoresis and fever.  HENT: Negative for congestion and rhinorrhea.   Eyes: Negative for visual disturbance.  Respiratory: Positive for cough, chest tightness and shortness of breath. Negative for wheezing and stridor.   Cardiovascular: Positive for chest pain and palpitations. Negative for leg swelling, syncope and near-syncope.  Gastrointestinal: Positive for abdominal pain, nausea and vomiting. Negative for constipation and diarrhea.  Genitourinary: Negative for dysuria, flank pain and pelvic pain.  Musculoskeletal: Negative for back pain, neck pain and neck stiffness.  Skin: Negative for rash and wound.  Neurological: Negative for light-headedness, numbness and headaches.  Psychiatric/Behavioral: Negative for agitation and confusion.  All other systems reviewed and are negative.    Physical Exam Updated Vital Signs BP 111/63 (BP Location: Left Arm)   Pulse 93   Temp 98.3 F (36.8 C) (Oral)   Resp 20   Ht  (1.651 m)   Wt 65.8 kg (145 lb)   LMP 04/11/2017   SpO2 99%   BMI 24.13 kg/m   Physical Exam  Constitutional: She is oriented to person, place, and time. She appears well-developed and well-nourished. No distress.  HENT:  Head: Normocephalic and atraumatic.  Mouth/Throat: Oropharynx is clear and moist. No oropharyngeal exudate.  Eyes: Pupils are equal, round, and reactive to light. Conjunctivae and EOM are normal.  Neck: Normal range of motion. Neck supple.  Cardiovascular: Regular rhythm, normal heart sounds and intact distal pulses.  Tachycardia present.   No murmur  heard. Pulmonary/Chest: Effort normal and breath sounds normal. No stridor. No respiratory distress. She has no wheezes.  Abdominal: Soft. She exhibits no distension. There is tenderness (mild and diffuse).  Musculoskeletal: She exhibits tenderness (diffuse). She exhibits no edema.  Neurological: She is alert and oriented to person, place, and time. No cranial nerve deficit or sensory deficit. She exhibits normal muscle tone.  Skin: Skin is warm and dry. Capillary refill takes less than 2 seconds. No rash noted. She is not diaphoretic. No erythema.  Psychiatric: She has a normal mood and affect.  Nursing note and vitals reviewed.    ED Treatments / Results  Labs (all labs ordered are listed, but only abnormal results are displayed) Labs Reviewed  CBC WITH DIFFERENTIAL/PLATELET - Abnormal; Notable for the following:       Result Value   Hemoglobin 11.4 (*)    HCT  35.3 (*)    Platelets 409 (*)    All other components within normal limits  COMPREHENSIVE METABOLIC PANEL - Abnormal; Notable for the following:    ALT 10 (*)    All other components within normal limits  I-STAT CG4 LACTIC ACID, ED - Abnormal; Notable for the following:    Lactic Acid, Venous 0.42 (*)    All other components within normal limits  URINE CULTURE  URINALYSIS, ROUTINE W REFLEX MICROSCOPIC  TROPONIN I  LIPASE, BLOOD  D-DIMER, QUANTITATIVE (NOT AT [facility])  CBC WITH DIFFERENTIAL/PLATELET  TROPONIN I  I-STAT CG4 LACTIC ACID, ED    EKG  EKG Interpretation  Date/Time:  Friday April 15 2017 13:43:57 EDT Ventricular Rate:  98 PR Interval:    QRS Duration: 86 QT Interval:  348 QTC Calculation: 445 R Axis:   76 Text Interpretation:  Sinus rhythm ST elev, probable normal early repol pattern When compared to prior, similar diffuse ST elevations. No STEMI Confirmed by Theda Belfast ([id number]) on 04/15/2017 1:48:22 PM Also confirmed by Theda Belfast ([id number]), editor Barbette Hair [phone])  on 04/15/2017 2:16:26 PM        Radiology Dg Chest 2 View  Result Date: 04/15/2017 CLINICAL DATA:  Chest pain and shortness of breath today. Shortness of breath earlier this week. EXAM: CHEST  2 VIEW COMPARISON:  Radiographs 08/11/2016 and 06/10/2016. FINDINGS: The heart size and mediastinal contours are normal. The lungs are clear. There is no pleural effusion or pneumothorax. No acute osseous findings are identified. Telemetry leads overlie the chest. IMPRESSION: Stable chest.  No active cardiopulmonary process. Electronically Signed   By: Carey Bullocks M.D.   On: 04/15/2017 15:04    Procedures Procedures (including critical care time)  Medications Ordered in ED Medications  ondansetron (ZOFRAN) injection 4 mg (4 mg Intravenous Given 04/15/17 1350)  morphine 4 MG/ML injection 4 mg (4 mg Intravenous Given 04/15/17 1425)  sodium chloride 0.9 % bolus 1,000 mL (0 mLs Intravenous Stopped 04/15/17 1633)  morphine 4 MG/ML injection 4 mg (4 mg Intravenous Given 04/15/17 1604)     Initial Impression / Assessment and Plan / ED Course  I have reviewed the triage vital signs and the nursing notes.  Pertinent labs & imaging results that were available during my care of the patient were reviewed by me and considered in my medical decision making (see chart for details).     Molly Perkins is a 26 y.o. female with a past medical history significant for asthma, lupus, GERD, stomach ulcers, and pericarditis who presents with chills, chest pain, shortness of breath, fatigue, malaise, generalized joint aching, nausea, and vomiting. Patient says that for the last week she has had some generalized aching however beginning yesterday, she had nausea and vomiting. She says she developed chest pain that is tender to 10 in severity and severe. She describes as a stabbing sharp pain that is worsened when she leans forward. She says it feels like when she had the severe pericarditis requiring admission in the past. She says that she is  having shortness of breath. It is worse when she takes deep breaths. She says that her symptoms are worse when she tries to walk around and exert herself. She denies radiation of the pain. She reports a mild dry cough. She denies hemoptysis. She denies leg swelling or unilateral leg pain. She reports pain in all of her joints. She denies pain in her flanks. She denies urinary symptoms or GI symptoms. She has chills but  no fevers. She denies recent traumas. She says that she normally takes colchicine for the pericarditis but it is not working. She denies other complaints on arrival.  On exam, patient has tenderness all over her body. She also has tenderness in her chest. Patient reports her pain is worsened when she leans forward. Patient's lungs were clear to auscultation bilaterally. No murmurs were heard. Abdomen was nonfocal and nontender. No focal neurologic deficits. No facial droop. No lower extremity edema seen.  Patient had EKG showing diffuse ST elevation similar to prior EKG with the pericarditis. Patient will have workup to look for infection or other etiology of her symptoms. Suspect recurrence of pericarditis given the description of symptoms being similar.  Anticipate reassessment after workup.   4:25 PM Patient's diagnostic testing results are seen above. Troponin negative. No leukocytosis. Lactic acid nonelevated. Doubt infection. Metabolic panel reassuring. Lipase not elevated. Urinalysis shows no infection. D-dimer negative, doubt PE. Chest x-ray showed no pneumonia. EKG appears similar to prior.  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 With patient's reassuring vital signs and improving symptoms with narcotic pain medicine, suspect patient will be safe for discharge home and outpatient managing.  Previous cardiology note recommends colchicine and narcotics for the pain. Patient reports that she is almost out of her medications. Patient will be given a prescription  for colchicine as well as an order for Percocet to escalate her pain regimen. Patient given nausea medication for her nausea and to stay hydrated. Patient will follow-up with her PCP and understood strict return precautions. Patient agreed with plan of discharge and patient discharged in good condition.    Final Clinical Impressions(s) / ED Diagnoses   Final diagnoses:  Diffuse pain  Chills  Chest pain, unspecified type  Shortness of breath  Pericarditis, unspecified chronicity, unspecified type    New Prescriptions New Prescriptions   COLCHICINE 0.6 MG TABLET    Take 1 tablet (0.6 mg total) by mouth daily.   ONDANSETRON (ZOFRAN) 4 MG TABLET    Take 1 tablet (4 mg total) by mouth every 8 (eight) hours as needed for nausea or vomiting.   OXYCODONE-ACETAMINOPHEN (PERCOCET/ROXICET) 5-325 MG TABLET    Take 1 tablet by mouth every 4 (four) hours as needed for severe pain.    Clinical Impression: 1. Diffuse pain   2. Chills   3. Chest pain, unspecified type   4. Shortness of breath   5. Pericarditis, unspecified chronicity, unspecified type     Disposition: Discharge  Condition: Good  I have discussed the results, Dx and Tx plan with the pt(& family if present). He/she/they expressed understanding and agree(s) with the plan. Discharge instructions discussed at great length. Strict return precautions discussed and pt &/or family have verbalized understanding of the instructions. No further questions at time of discharge.    New Prescriptions   COLCHICINE 0.6 MG TABLET    Take 1 tablet (0.6 mg total) by mouth daily.   ONDANSETRON (ZOFRAN) 4 MG TABLET    Take 1 tablet (4 mg total) by mouth every 8 (eight) hours as needed for nausea or vomiting.   OXYCODONE-ACETAMINOPHEN (PERCOCET/ROXICET) 5-325 MG TABLET    Take 1 tablet by mouth every 4 (four) hours as needed for severe pain.    Follow Up: Verlon Au, MD [address] [place]  [address] [phone]  Schedule an appointment as soon as possible for a visit    [facility] EMERGENCY DEPARTMENT [address] [id number] mc [facility]  [place] [address] [phone]  If symptoms worsen     Tegeler, Canary Brim, MD 04/15/17 [phone]

## 2017-04-15 NOTE — Discharge Instructions (Signed)
Your workup today showed no evidence of infections or injury to your heart. Your tests were negative for a blood clot in your lungs. We suspect you have pericarditis again causing your symptoms. Given your reassuring blood work and vital signs and improving symptoms, we feel comfortable letting you home for outpatient management. I am adding a stronger pain medication to your home colchicine which I have refilled. Please use the nausea medicine and stay hydrated. Please follow-up with your primary care physician in the next several days. If any symptoms change or worsen, please return to the nearest emergency department.

## 2017-04-15 NOTE — ED Triage Notes (Signed)
Pt reports hx of lupus. C/o joint pain x 1 week, states this feels like her normal lupus flare.

## 2017-04-15 NOTE — ED Notes (Signed)
Pt's mother to desk, states that pt is sitting on floor "throwing up into a trash can". States pt needs ekg because she has pericarditis. Dr. Rush Landmark made aware and states he will eval pt

## 2017-04-15 NOTE — ED Notes (Signed)
RN called mother; stated she was being discharged and needed a ride home. Mother stated she was on her way. Charge nurse notified.

## 2017-04-15 NOTE — ED Notes (Signed)
Pt mother in hall stating that pt needs to be on the monitor, IV fluids, something for pain, and something for nausea. MD notified and going to evaluate pt.

## 2017-04-15 NOTE — ED Notes (Signed)
Family at bedside. 

## 2017-04-15 NOTE — ED Notes (Signed)
Molly Perkins (mother)- [phone]

## 2017-04-15 NOTE — ED Notes (Signed)
ED Provider at bedside. 

## 2017-04-15 NOTE — ED Notes (Signed)
1953   Pt mother picked up prescriptions for zofran, percocet and colchicine     s Karilyn Wind rn

## 2017-04-16 LAB — URINE CULTURE

## 2017-04-21 ENCOUNTER — Emergency Department (HOSPITAL_BASED_OUTPATIENT_CLINIC_OR_DEPARTMENT_OTHER): Payer: Medicaid Other

## 2017-04-21 ENCOUNTER — Encounter (HOSPITAL_BASED_OUTPATIENT_CLINIC_OR_DEPARTMENT_OTHER): Payer: Self-pay | Admitting: Emergency Medicine

## 2017-04-21 ENCOUNTER — Emergency Department (HOSPITAL_BASED_OUTPATIENT_CLINIC_OR_DEPARTMENT_OTHER)
Admission: EM | Admit: 2017-04-21 | Discharge: 2017-04-21 | Disposition: A | Discharge status: Home / Self Care | Payer: Medicaid Other | Attending: Physician Assistant | Admitting: Physician Assistant

## 2017-04-21 DIAGNOSIS — Z87891 Personal history of nicotine dependence: Secondary | ICD-10-CM | POA: Diagnosis not present

## 2017-04-21 DIAGNOSIS — R112 Nausea with vomiting, unspecified: Secondary | ICD-10-CM | POA: Diagnosis not present

## 2017-04-21 DIAGNOSIS — J45909 Unspecified asthma, uncomplicated: Secondary | ICD-10-CM | POA: Diagnosis not present

## 2017-04-21 DIAGNOSIS — Z79899 Other long term (current) drug therapy: Secondary | ICD-10-CM | POA: Diagnosis not present

## 2017-04-21 DIAGNOSIS — Z7982 Long term (current) use of aspirin: Secondary | ICD-10-CM | POA: Insufficient documentation

## 2017-04-21 DIAGNOSIS — M3212 Pericarditis in systemic lupus erythematosus: Secondary | ICD-10-CM | POA: Diagnosis not present

## 2017-04-21 DIAGNOSIS — R079 Chest pain, unspecified: Secondary | ICD-10-CM | POA: Diagnosis present

## 2017-04-21 HISTORY — DX: Pericarditis in systemic lupus erythematosus: M32.12

## 2017-04-21 LAB — TROPONIN I: Troponin I: <0.03 ng/mL (ref <0.03)

## 2017-04-21 LAB — BASIC METABOLIC PANEL
Anion gap: 7 (ref 5–15)
BUN: 11 mg/dL (ref 6–20)
CO2: 26 mmol/L (ref 22–32)
CREATININE: 0.85 mg/dL (ref 0.44–1.00)
Calcium: 8.9 mg/dL (ref 8.9–10.3)
Chloride: 103 mmol/L (ref 101–111)
Glucose, Bld: 86 mg/dL (ref 65–99)
POTASSIUM: 3.9 mmol/L (ref 3.5–5.1)
SODIUM: 136 mmol/L (ref 135–145)

## 2017-04-21 LAB — CBC
HCT: 36.8 % (ref 36.0–46.0)
Hemoglobin: 11.9 g/dL — ABNORMAL LOW (ref 12.0–15.0)
MCH: 26.9 pg (ref 26.0–34.0)
MCHC: 32.3 g/dL (ref 30.0–36.0)
MCV: 83.1 fL (ref 78.0–100.0)
PLATELETS: 375 10*3/uL (ref 150–400)
RBC: 4.43 MIL/uL (ref 3.87–5.11)
RDW: 13.5 % (ref 11.5–15.5)
WBC: 7.2 10*3/uL (ref 4.0–10.5)

## 2017-04-21 MED ORDER — ONDANSETRON 4 MG PO TBDP
ORAL_TABLET | ORAL | Status: AC
  Filled 2017-04-21: qty 1

## 2017-04-21 MED ORDER — GI COCKTAIL ~~LOC~~
30.0000 mL | Freq: Once | ORAL | Status: AC
  Administered 2017-04-21: 30 mL via ORAL
  Filled 2017-04-21: qty 30

## 2017-04-21 MED ORDER — SUCRALFATE 1 GM/10ML PO SUSP
1.0000 g | Freq: Once | ORAL | Status: AC
  Administered 2017-04-21: 1 g via ORAL
  Filled 2017-04-21: qty 10

## 2017-04-21 MED ORDER — SODIUM CHLORIDE 0.9 % IV BOLUS (SEPSIS)
1000.0000 mL | Freq: Once | INTRAVENOUS | Status: AC
  Administered 2017-04-21: 1000 mL via INTRAVENOUS

## 2017-04-21 MED ORDER — ONDANSETRON HCL 4 MG/2ML IJ SOLN
4.0000 mg | Freq: Once | INTRAMUSCULAR | Status: AC
  Administered 2017-04-21: 4 mg via INTRAVENOUS
  Filled 2017-04-21: qty 2

## 2017-04-21 MED ORDER — MORPHINE SULFATE (PF) 4 MG/ML IV SOLN
4.0000 mg | Freq: Once | INTRAVENOUS | Status: AC
  Administered 2017-04-21: 4 mg via INTRAVENOUS
  Filled 2017-04-21: qty 1

## 2017-04-21 MED ORDER — ONDANSETRON 4 MG PO TBDP
4.0000 mg | ORAL_TABLET | Freq: Once | ORAL | Status: AC
  Administered 2017-04-21: 4 mg via ORAL
  Filled 2017-04-21: qty 1

## 2017-04-21 NOTE — ED Notes (Signed)
Pt's mother called department angry, belligerent.  Demanding to know why patient was not taken to a room.  Mother upset that the patient has been waiting for 40 minutes.  Mother upset that her daughter has pericarditis.  Informed mother that we did do an EKG and the MD saw it.  She did not feel this was enough.  Explained to her that we would bring the patient back ASAP.  Pt was seen in triage and VSS.

## 2017-04-21 NOTE — ED Notes (Signed)
Pt given a gown to change into and a warm blanket 

## 2017-04-21 NOTE — ED Notes (Signed)
ED Provider at bedside. 

## 2017-04-21 NOTE — ED Notes (Signed)
Pt put on cardiac monitoring. Pt in NAD. Pt reports no vomiting and decreased nausea at this time. Pt resting comfortably.

## 2017-04-21 NOTE — ED Triage Notes (Signed)
Ongoing chest pain for over 1 week. Seen last week here and by PCP yesterday. Also reports vomiting. States PCP gave her zofran yesterday and it is not working.

## 2017-04-21 NOTE — ED Notes (Signed)
Pt given gingerale for PO challenge 

## 2017-04-21 NOTE — ED Provider Notes (Signed)
[facility] Provider Note   CSN: [id number] Arrival date & time: 04/21/17  1709     History   Chief Complaint Chief Complaint  Patient presents with  . Chest Pain    HPI [place] C Toft is a 26 y.o. female.  HPI   Patient is a 26 year old female with history of lupus, chronic pericarditis seen by [facility] cardiology. She is here today with continued chest pain. Patient is seen by a pain physician and had all of her perceptions refilled today. However she states she's been having nausea and vomiting which is made hard to take her pain medications at home. No diarrhea no fevers. She reports it feels much like an ulcer that she had previously. Patient supposed to be on Nexium daily.    Past Medical History:  Diagnosis Date  . Arthritis   . Asthma   . 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 Depressed   . Fibromyalgia   . GERD (gastroesophageal reflux disease)   . History of stomach ulcers   . Lupus   . Opiate abuse, continuous (HCC)   . PTSD (post-traumatic stress disorder)     Patient Active Problem List   Diagnosis Date Noted  . Chest pain 08/11/2016  . 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 Fibromyalgia 10/19/2012  . Neuropathic pain of hand 10/19/2012  . Marijuana dependence (HCC) 05/04/2012    Class: Chronic    Past Surgical History:  Procedure Laterality Date  . NO PAST SURGERIES      OB History    Gravida Para Term Preterm AB Living   0 0 0 0 0 0   SAB TAB Ectopic Multiple Live Births   0 0 0 0         Home Medications    Prior to Admission medications   Medication Sig Start Date End Date Taking? Authorizing Provider  aspirin 81 MG tablet Take 325 mg by mouth daily.     [provider]  colchicine 0.6 MG tablet Take 0.6 mg by mouth daily. 11/09/15   [provider]  colchicine 0.6 MG tablet Take 1 tablet (0.6 mg total) by mouth daily. 04/15/17   Tegeler,  Canary Brim, MD  colchicine 0.6 MG tablet Take 1 tablet (0.6 mg total) by mouth daily. 04/15/17   Little, Ambrose Finland, MD  ferrous sulfate 325 (65 FE) MG tablet Take 1 tablet (325 mg total) by mouth daily. 09/11/15   Gwyneth Sprout, MD  folic acid (FOLVITE) 1 MG tablet Take 1 mg by mouth daily.     [provider]  hydroxychloroquine (PLAQUENIL) 200 MG tablet Take 200 mg by mouth 2 (two) times daily. 11/12/15   [provider]  hydroxychloroquine (PLAQUENIL) 200 MG tablet Take 200 mg by mouth. 11/12/15   [provider]  indomethacin (INDOCIN) 25 MG capsule Take 25 mg by mouth 2 (two) times daily.     [provider]  methotrexate (RHEUMATREX) 2.5 MG tablet Take 10 mg by mouth once a week. Caution:Chemotherapy. Protect from light.    [provider]  methotrexate (RHEUMATREX) 5 MG tablet Take by mouth.    [provider]  ondansetron (ZOFRAN ODT) 4 MG disintegrating tablet Take 1 tablet (4 mg total) by mouth every 8 (eight) hours as needed for nausea or vomiting. 04/15/17   Little, Ambrose Finland, MD  ondansetron (ZOFRAN) 4 MG tablet Take 1 tablet (4 mg total) by mouth every 8 (eight) hours  as needed for nausea or vomiting. 04/15/17   Tegeler, Canary Brim, MD  oxyCODONE-acetaminophen (PERCOCET) 5-325 MG tablet Take 1 tablet by mouth every 4 (four) hours as needed. 04/15/17   Little, Ambrose Finland, MD  oxyCODONE-acetaminophen (PERCOCET/ROXICET) 5-325 MG tablet Take 1 tablet by mouth every 4 (four) hours as needed for severe pain. 04/15/17   Tegeler, Canary Brim, MD  pantoprazole (PROTONIX) 20 MG tablet Take 20 mg by mouth 2 (two) times daily.    [provider]  pantoprazole (PROTONIX) 40 MG tablet Take 40 mg by mouth. 08/09/16   [provider]  ranitidine (ZANTAC) 75 MG tablet Take 75 mg by mouth. 11/09/15   [provider]  sertraline (ZOLOFT) 25 MG tablet Take 25 mg by mouth daily.    [provider]    sucralfate (CARAFATE) 1 g tablet Take 1 tablet (1 g total) by mouth 4 (four) times daily. 08/11/16 08/11/17  Governor Rooks, MD  traMADol (ULTRAM) 50 MG tablet Take 50 mg by mouth. 06/11/16   [provider]    Family History Family History  Problem Relation Age of Onset  . Fibromyalgia Mother     Social History Social History  Substance Use Topics  . Smoking status: Former Smoker    Packs/day: 0.50    Years: 3.00    Types: Cigarettes    Quit date: 02/16/2014  . Smokeless tobacco: Never Used  . Alcohol use No     Comment: denies current use     Allergies   Bee venom; Corn-containing products; Fish-derived products; and Soy allergy   Review of Systems Review of Systems  Constitutional: Negative for activity change.  Respiratory: Negative for shortness of breath.   Cardiovascular: Positive for chest pain.  Gastrointestinal: Positive for abdominal pain and nausea.  All other systems reviewed and are negative.    Physical Exam Updated Vital Signs BP 117/73   Pulse 89   Temp 99 F (37.2 C) (Oral)   Resp 12   Ht  (1.651 m)   Wt 65.8 kg (145 lb)   LMP 04/11/2017   SpO2 100%   BMI 24.13 kg/m   Physical Exam  Constitutional: She is oriented to person, place, and time. She appears well-developed and well-nourished.  HENT:  Head: Normocephalic and atraumatic.  Eyes: Right eye exhibits no discharge.  Cardiovascular: Normal rate.   No murmur heard. No rub.  Pulmonary/Chest: Effort normal.  Abdominal: Soft. She exhibits no distension. There is no tenderness.  Mild epigastric pain.  Neurological: She is oriented to person, place, and time.  Skin: Skin is warm and dry. She is not diaphoretic.  Psychiatric: She has a normal mood and affect.  Nursing note and vitals reviewed.    ED Treatments / Results  Labs (all labs ordered are listed, but only abnormal results are displayed) Labs Reviewed  CBC - Abnormal; Notable for the following:       Result  Value   Hemoglobin 11.9 (*)    All other components within normal limits  BASIC METABOLIC PANEL  TROPONIN I    EKG  EKG Interpretation  Date/Time:  Thursday April 21 2017 17:18:39 EDT Ventricular Rate:  95 PR Interval:  162 QRS Duration: 70 QT Interval:  350 QTC Calculation: 439 R Axis:   82 Text Interpretation:  Normal sinus rhythm with sinus arrhythmia ST elevation, consider early repolarization, pericarditis, or injury Abnormal ECG No significant change since last tracing Confirmed by [facility], Kanyon Seibold ([id number]) on 04/21/2017 8:17:43 PM  Radiology Dg Chest 2 View  Result Date: 04/21/2017 CLINICAL DATA:  Chest pain with N/V x 5 days. HX: Asthma, Lupus, Fibromyalgia, former smoker. EXAM: CHEST  2 VIEW COMPARISON:  04/15/2017 FINDINGS: The heart size and mediastinal contours are within normal limits. Both lungs are clear. No pleural effusion or pneumothorax. The visualized skeletal structures are unremarkable. IMPRESSION: Normal chest radiographs. Electronically Signed   By: Amie Portland M.D.   On: 04/21/2017 19:03    Procedures Procedures (including critical care time)  Medications Ordered in ED Medications  sodium chloride 0.9 % bolus 1,000 mL (not administered)  gi cocktail (Maalox,Lidocaine,Donnatal) (not administered)  ondansetron (ZOFRAN) injection 4 mg (not administered)  morphine 4 MG/ML injection 4 mg (not administered)  ondansetron (ZOFRAN-ODT) disintegrating tablet 4 mg (4 mg Oral Given 04/21/17 1918)     Initial Impression / Assessment and Plan / ED Course  I have reviewed the triage vital signs and the nursing notes.  Pertinent labs & imaging results that were available during my care of the patient were reviewed by me and considered in my medical decision making (see chart for details).      Patient is a 26 year old female with history of lupus, chronic pericarditis seen by [facility] cardiology. She is here today with continued chest pain. Patient is  seen by a pain physician and had all of her perceptions refilled today. However she states she's been having nausea and vomiting which is made hard to take her pain medications at home. No diarrhea no fevers. She reports it feels much like an ulcer that she had previously. Patient supposed to be on Nexium daily.\ \ 8:18 PM Patient has normal vital signs. Has not vomited here. Has no evidence of vomiting or dehydration on labs. We will give her 1 L fluid with GI cocktail and one dose of pain medication. We'll by mouth challenge. We'll plan to discharge home with follow-up with gastroenterology, her cardiologist, and her pain physicians.   GIven medications, feels impprovd.  Mostly complaining of burning epigastric pain, no sig tenderness on exam, doubt perfed ulcer.  Ho ulcers, will have her continue PPI .  Tolerating PO at discharge.    Final Clinical Impressions(s) / ED Diagnoses   Final diagnoses:  None    New Prescriptions New Prescriptions   No medications on file     Abelino Derrick, MD 04/22/17 [phone]

## 2017-04-21 NOTE — Discharge Instructions (Signed)
Please follow up with her gastroenterologist and your cardiologist at [facility]. Be sure that you are taking your Protonix daily and return with any concerns.

## 2017-04-21 NOTE — ED Notes (Signed)
Spoke with mother on the phone who is upset that pt has not received any IV fluids or pain medication since arrival. Pt has given permission to speak with mother on the phone. Explained to mother pt is awaiting MD evaluation and no further orders could be done without orders from MD. Mother very argumentative and threatening on phone. Pt has normal labs, chest xray and vitals signs. Dr. Corlis Leak made aware of pt's status and will see pt as soon as possible.

## 2017-04-21 NOTE — ED Notes (Signed)
Pt tolerating fluids at this time.  

## 2017-07-26 ENCOUNTER — Other Ambulatory Visit: Payer: Self-pay

## 2017-07-26 ENCOUNTER — Encounter (HOSPITAL_BASED_OUTPATIENT_CLINIC_OR_DEPARTMENT_OTHER): Payer: Self-pay

## 2017-07-26 ENCOUNTER — Emergency Department (HOSPITAL_BASED_OUTPATIENT_CLINIC_OR_DEPARTMENT_OTHER): Payer: Medicaid Other

## 2017-07-26 ENCOUNTER — Emergency Department (HOSPITAL_BASED_OUTPATIENT_CLINIC_OR_DEPARTMENT_OTHER)
Admission: EM | Admit: 2017-07-26 | Discharge: 2017-07-26 | Disposition: A | Discharge status: Home / Self Care | Payer: Medicaid Other | Attending: Emergency Medicine | Admitting: Emergency Medicine

## 2017-07-26 DIAGNOSIS — R1010 Upper abdominal pain, unspecified: Secondary | ICD-10-CM | POA: Diagnosis present

## 2017-07-26 DIAGNOSIS — Z79899 Other long term (current) drug therapy: Secondary | ICD-10-CM | POA: Insufficient documentation

## 2017-07-26 DIAGNOSIS — M3210 Systemic lupus erythematosus, organ or system involvement unspecified: Secondary | ICD-10-CM | POA: Diagnosis not present

## 2017-07-26 DIAGNOSIS — Z87891 Personal history of nicotine dependence: Secondary | ICD-10-CM | POA: Insufficient documentation

## 2017-07-26 DIAGNOSIS — G43A0 Cyclical vomiting, not intractable: Secondary | ICD-10-CM | POA: Diagnosis not present

## 2017-07-26 DIAGNOSIS — R112 Nausea with vomiting, unspecified: Secondary | ICD-10-CM

## 2017-07-26 DIAGNOSIS — R1115 Cyclical vomiting syndrome unrelated to migraine: Secondary | ICD-10-CM

## 2017-07-26 LAB — URINALYSIS, MICROSCOPIC (REFLEX): WBC UA: NONE SEEN WBC/hpf (ref 0–5)

## 2017-07-26 LAB — CBC WITH DIFFERENTIAL/PLATELET
Basophils Absolute: 0 10*3/uL (ref 0.0–0.1)
Basophils Relative: 0 %
Eosinophils Absolute: 0.2 10*3/uL (ref 0.0–0.7)
Eosinophils Relative: 4 %
HEMATOCRIT: 40.8 % (ref 36.0–46.0)
Hemoglobin: 13.3 g/dL (ref 12.0–15.0)
LYMPHS ABS: 1.6 10*3/uL (ref 0.7–4.0)
LYMPHS PCT: 30 %
MCH: 25.6 pg — ABNORMAL LOW (ref 26.0–34.0)
MCHC: 32.6 g/dL (ref 30.0–36.0)
MCV: 78.5 fL (ref 78.0–100.0)
MONOS PCT: 7 %
Monocytes Absolute: 0.3 10*3/uL (ref 0.1–1.0)
Neutro Abs: 3.1 10*3/uL (ref 1.7–7.7)
Neutrophils Relative %: 59 %
Platelets: 437 10*3/uL — ABNORMAL HIGH (ref 150–400)
RBC: 5.2 MIL/uL — ABNORMAL HIGH (ref 3.87–5.11)
RDW: 14 % (ref 11.5–15.5)
WBC: 5.3 10*3/uL (ref 4.0–10.5)

## 2017-07-26 LAB — URINALYSIS, ROUTINE W REFLEX MICROSCOPIC
BILIRUBIN URINE: NEGATIVE
GLUCOSE, UA: NEGATIVE mg/dL
KETONES UR: 40 mg/dL — AB
Leukocytes, UA: NEGATIVE
Nitrite: NEGATIVE
PROTEIN: NEGATIVE mg/dL
Specific Gravity, Urine: 1.01 (ref 1.005–1.030)
pH: 7.5 (ref 5.0–8.0)

## 2017-07-26 LAB — COMPREHENSIVE METABOLIC PANEL
ALT: 11 U/L — ABNORMAL LOW (ref 14–54)
ANION GAP: 10 (ref 5–15)
AST: 21 U/L (ref 15–41)
Albumin: 4.5 g/dL (ref 3.5–5.0)
Alkaline Phosphatase: 72 U/L (ref 38–126)
BILIRUBIN TOTAL: 0.5 mg/dL (ref 0.3–1.2)
BUN: 8 mg/dL (ref 6–20)
CO2: 26 mmol/L (ref 22–32)
Calcium: 9.6 mg/dL (ref 8.9–10.3)
Chloride: 101 mmol/L (ref 101–111)
Creatinine, Ser: 0.79 mg/dL (ref 0.44–1.00)
GFR calc Af Amer: >60 mL/min (ref >60)
Glucose, Bld: 102 mg/dL — ABNORMAL HIGH (ref 65–99)
Potassium: 4 mmol/L (ref 3.5–5.1)
Sodium: 137 mmol/L (ref 135–145)
TOTAL PROTEIN: 8 g/dL (ref 6.5–8.1)

## 2017-07-26 LAB — HCG, SERUM, QUALITATIVE: PREG SERUM: NEGATIVE

## 2017-07-26 LAB — LIPASE, BLOOD: LIPASE: 38 U/L (ref 11–51)

## 2017-07-26 LAB — PREGNANCY, URINE: Preg Test, Ur: NEGATIVE

## 2017-07-26 MED ORDER — SODIUM CHLORIDE 0.9 % IV BOLUS (SEPSIS)
1000.0000 mL | Freq: Once | INTRAVENOUS | Status: AC
  Administered 2017-07-26: 1000 mL via INTRAVENOUS

## 2017-07-26 MED ORDER — SODIUM CHLORIDE 0.9 % IV SOLN
Freq: Once | INTRAVENOUS | Status: AC
  Administered 2017-07-26: 17:00:00 via INTRAVENOUS

## 2017-07-26 MED ORDER — MORPHINE SULFATE (PF) 4 MG/ML IV SOLN
4.0000 mg | Freq: Once | INTRAVENOUS | Status: AC
  Administered 2017-07-26: 4 mg via INTRAVENOUS
  Filled 2017-07-26: qty 1

## 2017-07-26 MED ORDER — METOCLOPRAMIDE HCL 10 MG PO TABS: 10.0000 mg | ORAL_TABLET | Freq: Four times a day (QID) | ORAL | 0 refills | Status: DC

## 2017-07-26 MED ORDER — ONDANSETRON HCL 4 MG/2ML IJ SOLN
4.0000 mg | Freq: Once | INTRAMUSCULAR | Status: AC
  Administered 2017-07-26: 4 mg via INTRAVENOUS
  Filled 2017-07-26: qty 2

## 2017-07-26 MED ORDER — ONDANSETRON HCL 4 MG PO TABS: 4.0000 mg | ORAL_TABLET | Freq: Three times a day (TID) | ORAL | 0 refills | Status: DC | PRN

## 2017-07-26 MED ORDER — IOPAMIDOL (ISOVUE-300) INJECTION 61%
100.0000 mL | Freq: Once | INTRAVENOUS | Status: AC | PRN
  Administered 2017-07-26: 100 mL via INTRAVENOUS

## 2017-07-26 MED ORDER — MORPHINE SULFATE (PF) 4 MG/ML IV SOLN
4.0000 mg | INTRAVENOUS | Status: DC | PRN
Start: 2017-07-26 — End: 2017-07-26

## 2017-07-26 MED ORDER — ALUM & MAG HYDROXIDE-SIMETH 200-200-20 MG/5ML PO SUSP
30.0000 mL | Freq: Once | ORAL | Status: AC
  Administered 2017-07-26: 30 mL via ORAL
  Filled 2017-07-26: qty 30

## 2017-07-26 MED ORDER — ONDANSETRON HCL 4 MG/2ML IJ SOLN: 4.0000 mg | Freq: Four times a day (QID) | INTRAMUSCULAR | Status: DC | PRN

## 2017-07-26 MED ORDER — LIDOCAINE VISCOUS 2 % MT SOLN
15.0000 mL | Freq: Once | OROMUCOSAL | Status: AC
Start: 2017-07-26 — End: 2017-07-26
  Administered 2017-07-26: 15 mL via OROMUCOSAL
  Filled 2017-07-26: qty 15

## 2017-07-26 MED ORDER — METOCLOPRAMIDE HCL 5 MG/ML IJ SOLN
10.0000 mg | Freq: Three times a day (TID) | INTRAMUSCULAR | Status: DC | PRN
  Administered 2017-07-26: 10 mg via INTRAVENOUS
  Filled 2017-07-26: qty 2

## 2017-07-26 MED ORDER — ALUMINUM-MAGNESIUM-SIMETHICONE 200-200-20 MG/5ML PO SUSP: 30.0000 mL | Freq: Three times a day (TID) | ORAL | 0 refills | Status: DC

## 2017-07-26 NOTE — ED Triage Notes (Addendum)
Pt reports she has been sick for 5 days, but worse the last 3 days. Pt reports upper abdominal pain and vomiting. Pt has history of ulcers and lupus. Pt reports she has not kept anything down for several days.

## 2017-07-26 NOTE — ED Notes (Signed)
Cancelled transport per PAL [phone] @ [facility]

## 2017-07-26 NOTE — ED Notes (Signed)
Pt reports she wants to be admitted somewhere because when she goes home the pain is going to come back and she will not be able to take it. Pt expressed these feelings to RN and MD.

## 2017-07-26 NOTE — ED Provider Notes (Signed)
[facility] EMERGENCY DEPARTMENT Provider Note   CSN: [id number] Arrival date & time: 07/26/17  7829     History   Chief Complaint Chief Complaint  Patient presents with  . Abdominal Pain  . Emesis    HPI [place] C Mone is a 27 y.o. female.  HPI  27 year old female with history of lupus and stomach ulcers follows with GI at [facility].  She is complaining of 5 days of upper abdominal pain vomiting and inability to hold any fluids or medications down.  She denies any fever.  She has had similar episodes of this in the past.  Since not being on her medications she feels her joints are starting to flare also.  She called her GI doctor yesterday and told her to go to the hospital as they could do nothing for her in the clinic.  She had a prior endoscopy about 4 or 5 months ago per her report that showed her ulcers were healing.  She continues on her medication and does not understand why this acutely got worse.  Per her med list she is on some medications that may lead to worsening of her stomach condition.  She does have a prior history of pericarditis and does not feel like that is active now although she seen this pattern of if her abdominal symptoms get worse they will worsen into her pericarditis symptoms.  She is on chronic Vicodin at home but has been unable to keep this down and it is not helping with her pain.  Pain is sharp in nature and 8 out of 10 in intensity.  Past Medical History:  Diagnosis Date  . Arthritis   . Asthma   . 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 Depressed   . Fibromyalgia   . GERD (gastroesophageal reflux disease)   . History of stomach ulcers   . Lupus   . Opiate abuse, continuous (HCC)   . PTSD (post-traumatic stress disorder)     Patient Active Problem List   Diagnosis Date Noted  . Chest pain 08/11/2016  . 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 Fibromyalgia 10/19/2012  .  Neuropathic pain of hand 10/19/2012  . Marijuana dependence (HCC) 05/04/2012    Class: Chronic    Past Surgical History:  Procedure Laterality Date  . NO PAST SURGERIES    . UPPER GI ENDOSCOPY      OB History    Gravida Para Term Preterm AB Living   0 0 0 0 0 0   SAB TAB Ectopic Multiple Live Births   0 0 0 0         Home Medications    Prior to Admission medications   Medication Sig Start Date End Date Taking? Authorizing Provider  colchicine 0.6 MG tablet Take 0.6 mg by mouth daily. 11/09/15  Yes [provider]  ferrous sulfate 325 (65 FE) MG tablet Take 1 tablet (325 mg total) by mouth daily. 09/11/15  Yes Plunkett, Alphonzo Lemmings, MD  folic acid (FOLVITE) 1 MG tablet Take 1 mg by mouth daily.    Yes [provider]  hydroxychloroquine (PLAQUENIL) 200 MG tablet Take 200 mg by mouth. 11/12/15  Yes [provider]  indomethacin (INDOCIN) 25 MG capsule Take 25 mg by mouth 2 (two) times daily.    Yes [provider]  methotrexate (RHEUMATREX) 2.5 MG tablet Take 10 mg by mouth once a week. Caution:Chemotherapy. Protect from light.   Yes  [provider]  methotrexate (RHEUMATREX) 5 MG tablet Take by mouth.   Yes [provider]  ondansetron (ZOFRAN ODT) 4 MG disintegrating tablet Take 1 tablet (4 mg total) by mouth every 8 (eight) hours as needed for nausea or vomiting. 04/15/17  Yes Little, Ambrose Finland, MD  oxyCODONE-acetaminophen (PERCOCET/ROXICET) 5-325 MG tablet Take 1 tablet by mouth every 4 (four) hours as needed for severe pain. 04/15/17  Yes Tegeler, Canary Brim, MD  pantoprazole (PROTONIX) 20 MG tablet Take 20 mg by mouth 2 (two) times daily.   Yes [provider]  pantoprazole (PROTONIX) 40 MG tablet Take 40 mg by mouth. 08/09/16  Yes [provider]  ranitidine (ZANTAC) 75 MG tablet Take 75 mg by mouth. 11/09/15  Yes [provider]  sertraline (ZOLOFT) 25 MG tablet Take 25 mg by mouth daily.   Yes  [provider]  sucralfate (CARAFATE) 1 g tablet Take 1 tablet (1 g total) by mouth 4 (four) times daily. 08/11/16 08/11/17 Yes Governor Rooks, MD  colchicine 0.6 MG tablet Take 1 tablet (0.6 mg total) by mouth daily. 04/15/17   Tegeler, Canary Brim, MD  colchicine 0.6 MG tablet Take 1 tablet (0.6 mg total) by mouth daily. 04/15/17   Little, Ambrose Finland, MD  hydroxychloroquine (PLAQUENIL) 200 MG tablet Take 200 mg by mouth 2 (two) times daily. 11/12/15   [provider]  ondansetron (ZOFRAN) 4 MG tablet Take 1 tablet (4 mg total) by mouth every 8 (eight) hours as needed for nausea or vomiting. 04/15/17   Tegeler, Canary Brim, MD  oxyCODONE-acetaminophen (PERCOCET) 5-325 MG tablet Take 1 tablet by mouth every 4 (four) hours as needed. 04/15/17   Little, Ambrose Finland, MD  traMADol (ULTRAM) 50 MG tablet Take 50 mg by mouth. 06/11/16   [provider]    Family History Family History  Problem Relation Age of Onset  . Fibromyalgia Mother     Social History Social History   Tobacco Use  . Smoking status: Former Smoker    Packs/day: 0.50    Years: 3.00    Pack years: 1.50    Types: Cigarettes    Last attempt to quit: 02/16/2014    Years since quitting: 3.4  . Smokeless tobacco: Never Used  Substance Use Topics  . Alcohol use: No    Comment: denies current use  . Drug use: No    Comment: denies current use     Allergies   Bee venom; Corn-containing products; Fish-derived products; and Soy allergy   Review of Systems Review of Systems  Constitutional: Negative for chills and fever.  HENT: Negative for ear pain and sore throat.   Eyes: Negative for pain and visual disturbance.  Respiratory: Negative for cough and shortness of breath.   Cardiovascular: Positive for chest pain (asssociated with her vomiting). Negative for palpitations.  Gastrointestinal: Positive for abdominal pain, nausea and vomiting.  Genitourinary: Positive for vaginal bleeding (having  menses now, nl time). Negative for dysuria and hematuria.  Musculoskeletal: Positive for arthralgias. Negative for back pain.  Skin: Negative for color change and rash.  Neurological: Negative for seizures, syncope and headaches.  All other systems reviewed and are negative.    Physical Exam Updated Vital Signs BP 132/81 (BP Location: Right Arm)   Pulse 91   Temp 98.4 F (36.9 C) (Oral)   Resp 20   Ht 5\' 5"  (1.651 m)   Wt 61.2 kg (135 lb)   SpO2 97%   BMI 22.47 kg/m  Physical Exam  Constitutional: She appears well-developed and well-nourished.  HENT:  Head: Normocephalic and atraumatic.  Mouth/Throat: No oropharyngeal exudate.  Eyes: Conjunctivae are normal.  Neck: Neck supple.  Cardiovascular: Normal rate, regular rhythm, normal heart sounds and intact distal pulses.  Pulmonary/Chest: Effort normal and breath sounds normal.  Abdominal: Soft. She exhibits no distension. There is tenderness in the epigastric area. No hernia.  Musculoskeletal: Normal range of motion. She exhibits no edema.  Neurological: She is alert.  Skin: Skin is warm and dry. Capillary refill takes less than 2 seconds.  Psychiatric: She has a normal mood and affect.     ED Treatments / Results  Labs (all labs ordered are listed, but only abnormal results are displayed) Labs Reviewed  CBC WITH DIFFERENTIAL/PLATELET - Abnormal; Notable for the following components:      Result Value   RBC 5.20 (*)    MCH 25.6 (*)    Platelets 437 (*)    All other components within normal limits  COMPREHENSIVE METABOLIC PANEL - Abnormal; Notable for the following components:   Glucose, Bld 102 (*)    ALT 11 (*)    All other components within normal limits  URINALYSIS, ROUTINE W REFLEX MICROSCOPIC - Abnormal; Notable for the following components:   Hgb urine dipstick LARGE (*)    Ketones, ur 40 (*)    All other components within normal limits  URINALYSIS, MICROSCOPIC (REFLEX) - Abnormal; Notable for the  following components:   Bacteria, UA FEW (*)    Squamous Epithelial / LPF 0-5 (*)    All other components within normal limits  LIPASE, BLOOD  PREGNANCY, URINE  HCG, SERUM, QUALITATIVE    EKG  EKG Interpretation None       Radiology Ct Abdomen Pelvis W Contrast  Result Date: 07/26/2017 CLINICAL DATA:  Upper abdominal pain and vomiting for 5 days, worse over the past 3 days. The patient has a history of ulcers and lupus. EXAM: CT ABDOMEN AND PELVIS WITH CONTRAST TECHNIQUE: Multidetector CT imaging of the abdomen and pelvis was performed using the standard protocol following bolus administration of intravenous contrast. CONTRAST:  100 ml ISOVUE-300 IOPAMIDOL (ISOVUE-300) INJECTION 61% COMPARISON:  None. FINDINGS: Lower chest: Lung bases are clear. No pleural or pericardial effusion. Hepatobiliary: The gallbladder and liver appear normal. No intrahepatic biliary ductal dilatation. The common bile duct is mildly prominent but remains normal limits in diameter. Pancreas: Unremarkable. No pancreatic ductal dilatation or surrounding inflammatory changes. Spleen: Normal in size without focal abnormality. Adrenals/Urinary Tract: Adrenal glands are unremarkable. Kidneys are normal, without renal calculi, focal lesion, or hydronephrosis. Bladder is unremarkable. Stomach/Bowel: The stomach and small and large bowel appear normal. The appendix is not discretely visualized but no inflammatory process is identified. Vascular/Lymphatic: No significant vascular findings are present. No enlarged abdominal or pelvic lymph nodes. Reproductive: Uterus and bilateral adnexa are unremarkable. Other: No fluid collection or hernia. Musculoskeletal: No bony abnormality. IMPRESSION: Negative CT abdomen and pelvis. No finding to explain the patient's symptoms. Electronically Signed   By: Drusilla Kannerhomas  Dalessio M.D.   On: 07/26/2017 14:25    Procedures Procedures (including critical care time)  Medications Ordered in  ED Medications  morphine 4 MG/ML injection 4 mg (not administered)  ondansetron (ZOFRAN) injection 4 mg (not administered)  sodium chloride 0.9 % bolus 1,000 mL (not administered)     Initial Impression / Assessment and Plan / ED Course  I have reviewed the triage vital signs and the nursing notes.  Pertinent labs &  imaging results that were available during my care of the patient were reviewed by me and considered in my medical decision making (see chart for details).  Clinical Course as of Jul 27 1619  Tue Jul 26, 2017  1020 Reevaluation-patient sharp pain is improved although still has some dull discomfort.  She would like a second dose of pain medicine and then wants to try GI cocktail because that worked for her in the past.  Her lab work is just beginning to come back with a normal white count and a stable creatinine.  Her platelets are slightly elevated.  [MB]  1127 Reevaluation.  Pain is mostly resolved and she is tolerating p.o.  Reviewed the labs and they are concerned about the platelets being elevated as this in the past has required her to need to be admitted to the hospital.  I offered to call her rheumatologist to review the findings and get their recommendations.  [MB]  1146 Discussed with rheumatologist Dr. Allena Katz who helps manage her lupus.  He is not concerned about her elevated platelets and feels that this GI process is likely unrelated to her lupus.  She is on colchicine and indomethacin for her pericarditis symptoms and likely this is exacerbating what seems to be a very chronic GI complaint.  He feels from his standpoint and lupus that the patient can follow-up in the office.  [MB]  1230 Reevaluate-patient feeling better after the GI cocktail.  She has been able to tolerate some p.o.  She is asking for me to prescribe some Maalox for her and she understands that she needs to call her GI doctor for close follow-up.  [MB]  1253 Patient was much improved and ready for discharge  when her pain recurred and she began vomiting again.  Her mother is very concerned that she is not to do well home and continues to ask for her to be admitted.  We are re-dosing her with pain medicine and nausea medicine discussed with the hospitalist on-call.  [MB]  1337 Discussed with the PA for the patient's GI doctor of record Dr. Nedra Hai who has been able to review the notes and will wait on follow-up of the patient's CAT scan.He understands the predicament of patient  with respect to no beds in the area for admission if it is needed. He is working on getting the patient an outpatient appointment for tomorrow if the patient symptomatically can feel better otherwise if the patient can be admitted to [facility] he would have the [facility] GI team will be available to see the patient.  [MB]  1506 CT abdomen and pelvis was unremarkable.  I reviewed the results with the patient and she said she is feeling somewhat better.  She wants to p.o. trial with some ginger ale.  [MB]  1601 Patient has been tolerating some p.o. she understands outpatient with her GI doctor tomorrow at 130 but she is still uncomfortable going home.  She does not feel her symptoms have been well enough controlled.  I will talk to the admitting doctors at [facility] to see if they would accept her for admission.  Potentially she would be boarding here until a bed becomes available.  [MB]    Clinical Course User Index [MB] Terrilee Files, MD    27 year old with history of lupus on methotrexate here with what she feels is an exacerbation of her ulcer pain.  Upper abdominal pain with associated vomiting and inability to take any p.o.  Here she is anxious appearing and uncomfortable but her vitals were normal.  She is got tenderness of her upper abdomen without masses guarding or rebound.  She has never had any surgery.  Her last menstrual period is currently going on.  This is most likely gastritis or peptic ulcer disease she will need  screening labs to make sure this is not cholecystitis or other significant abdominal process.  She will need a repeat evaluation once her labs are done and her medications have infused.  She would like to try GI cocktail and I think this is reasonable once we can get her nausea cycle broken.    Final Clinical Impressions(s) / ED Diagnoses   Final diagnoses:  Non-intractable vomiting with nausea, unspecified vomiting type  Upper abdominal pain    ED Discharge Orders    None       Terrilee Files, MD 07/27/17 [phone]

## 2017-07-26 NOTE — ED Notes (Signed)
Mother's phone number: [phone]

## 2017-07-26 NOTE — ED Provider Notes (Signed)
I assumed care of this patient from Dr. Charm BargesButler at 1600.  Please see their note for further details of Hx, PE.  Briefly patient is a 27 y.o. female who presented with abdominal pain and cyclical vomiting in the setting of history of peptic ulcer disease due to chronic NSAID use.  Patient given multiple doses of antiemetics and pain medicine.  Still with nausea and vomiting.  We have called to [facility] pending for admission for intractable nausea and vomiting  Patient still complained of nausea vomiting with abdominal pain.  Spoke with Dr. Lucianne MussKumar at [facility] who accepted the patient.  Patient was given dose of Reglan in the interim.  Following this pain had improved so that her nausea and vomiting.  She was able to tolerate oral hydration and requested to be discharged home.  The patient is safe for discharge with strict return precautions.  Disposition: Discharge  Condition: Good  I have discussed the results, Dx and Tx plan with the patient who expressed understanding and agree(s) with the plan. Discharge instructions discussed at great length. The patient was given strict return precautions who verbalized understanding of the instructions. No further questions at time of discharge.    ED Discharge Orders        Ordered    ondansetron (ZOFRAN) 4 MG tablet  Every 8 hours PRN     07/26/17 1235    aluminum-magnesium hydroxide-simethicone (MAALOX) 200-200-20 MG/5ML SUSP  3 times daily before meals & bedtime     07/26/17 1235    metoCLOPramide (REGLAN) 10 MG tablet  Every 6 hours     07/26/17 1855       Follow Up: Verlon AuBoyd, Tammy Lamonica, MD [address] [place] [id number] [phone]  Schedule an appointment as soon as possible for a visit  For recheck of your symptoms  Verlon AuBoyd, Tammy Lamonica, MD [address] [place] [id number] [phone]  Schedule an appointment as soon as possible for a visit  As needed  Dr.  Nedra HaiLee (gastroenterology)  In 1 day as scheduled      Cardama, Amadeo GarnetPedro Eduardo, MD 07/26/17 [phone]

## 2017-07-26 NOTE — Discharge Instructions (Addendum)
You were evaluated in the emergency department for nausea and vomiting and upper abdominal pain.  Your lab work did not demonstrate any specific findings.  You were improved after some fluids and pain medication and nausea medicine.  We are prescribing you Maalox and ondansetron to help with your symptoms.  You should call your GI doctor for close follow-up.

## 2017-07-26 NOTE — ED Notes (Addendum)
Mother came out to desk multiple times stating "she can't go home like this", "there must be something you can do for her". MD notified. MD to bedside. Mother reported pt was vomiting again. RN and MD to bedside. Pt had what appeared to be was saliva spit on the floor. MD stated he would order more medication for pt's nausea. Pt's vitals are stable.

## 2017-07-26 NOTE — ED Notes (Signed)
Pt reported that she wanted to go home because she still felt the same as when she got here. Pt called her mother to come get her. Pt reports she will follow up with her gastro MD tomorrow. MD aware.

## 2017-08-11 IMAGING — CR DG CHEST 2V
1 series · 2 of 2 positions shown · non-contrast
Comparison: Chest x-ray of 06/10/2016

CLINICAL DATA: Vomiting for 2 days, chest pain, asthma

EXAM:
CHEST  2 VIEW

[Series 1: dg chest 2 view · 0.14mm/px · 2 of 2 slices shown]
[im 1/2]
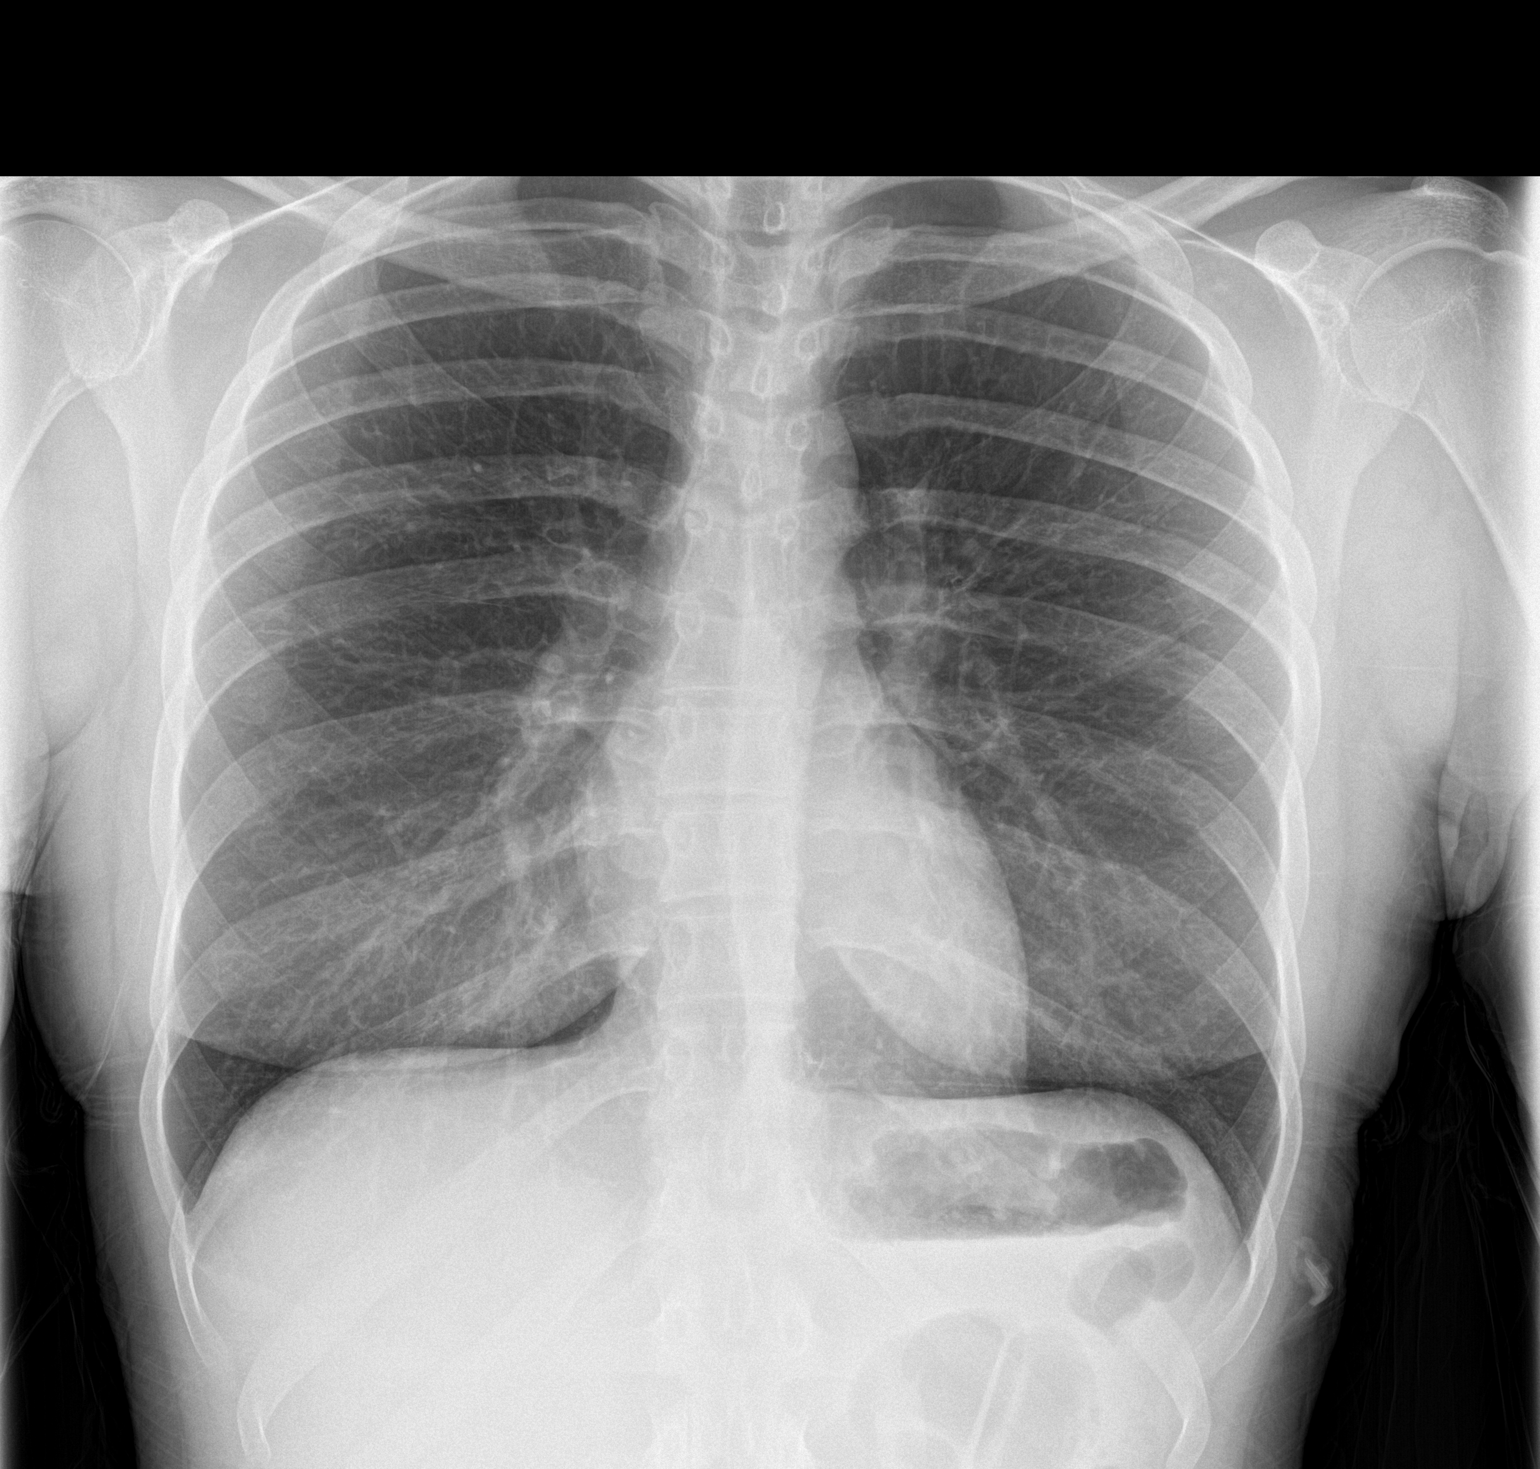
[im 2/2]
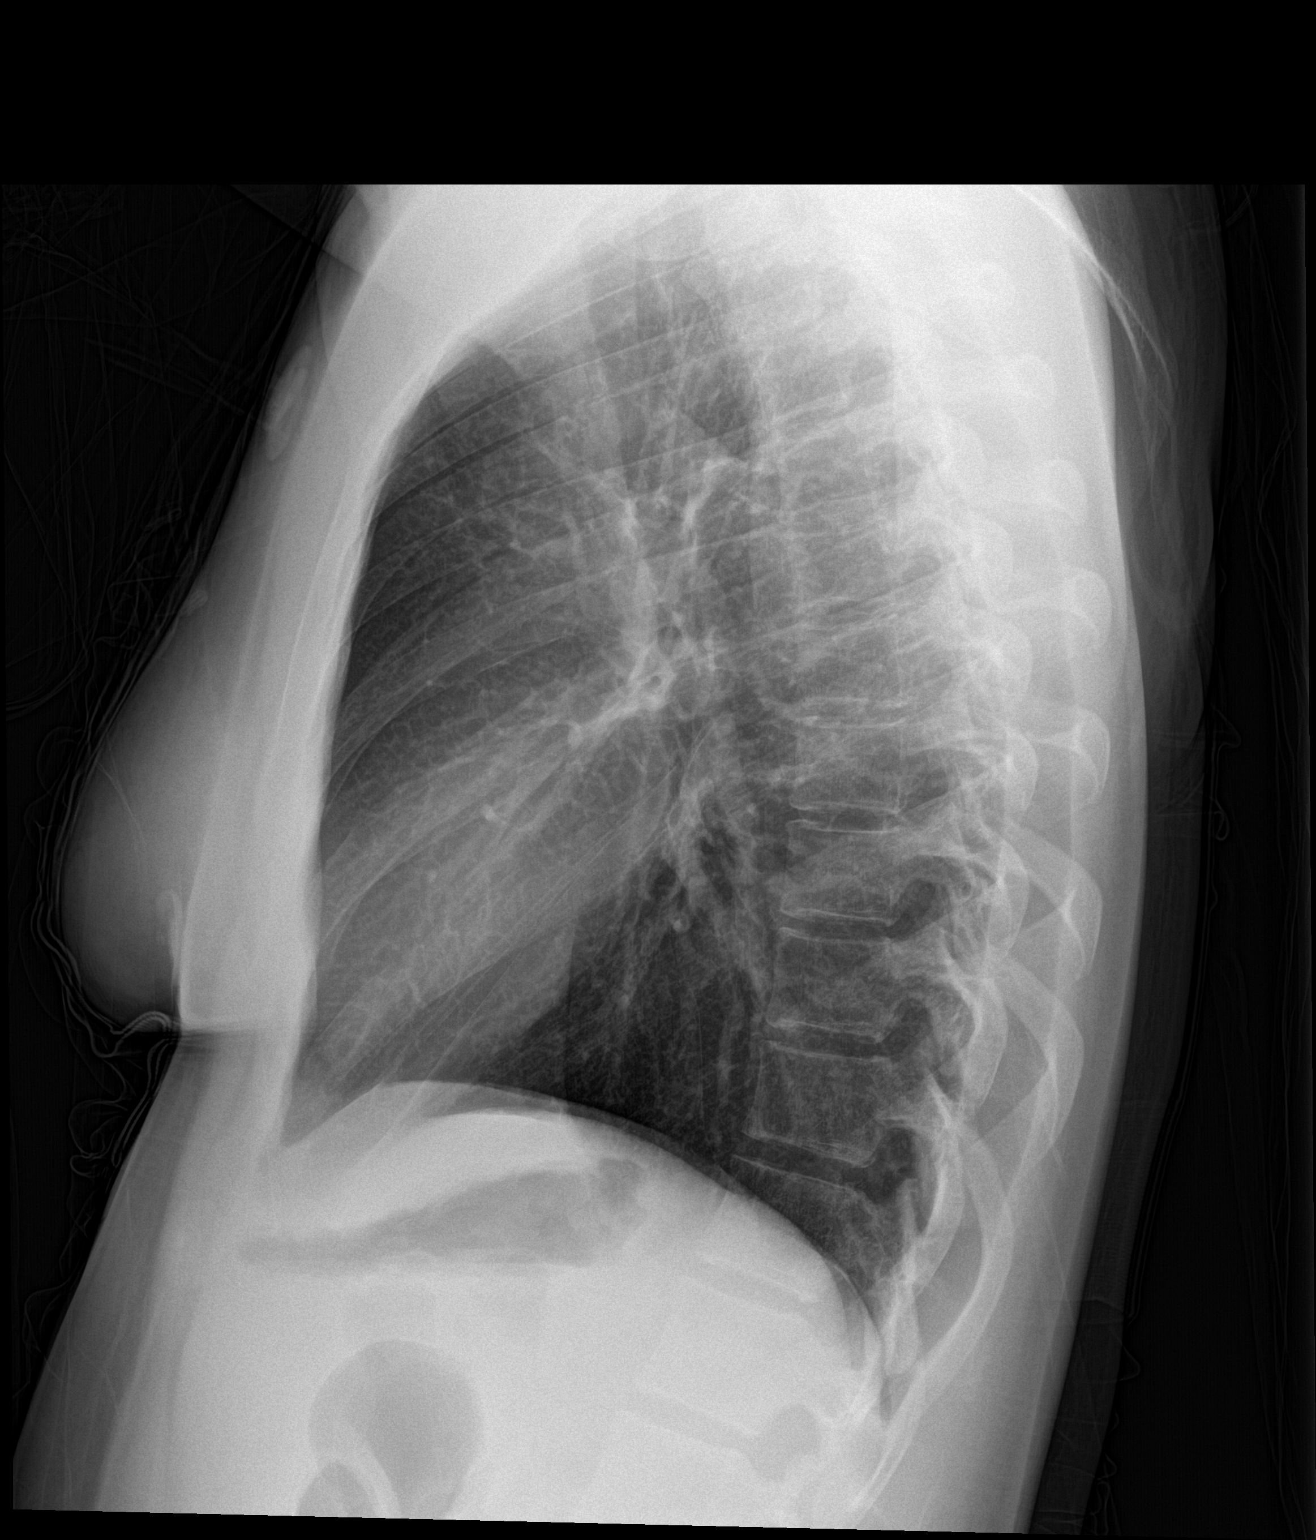

[2 of 2 positions shown; findings below may reference images not displayed]

FINDINGS: No active infiltrate or effusion is seen. Mediastinal and hilar
contours are unremarkable. The heart is within normal limits in
size. No bony abnormality is seen.
IMPRESSION: No active cardiopulmonary disease.

## 2017-10-18 ENCOUNTER — Institutional Professional Consult (permissible substitution): Payer: Self-pay | Admitting: Pulmonary Disease

## 2017-10-24 ENCOUNTER — Institutional Professional Consult (permissible substitution): Payer: Self-pay | Admitting: Internal Medicine

## 2019-05-02 ENCOUNTER — Encounter (HOSPITAL_COMMUNITY): Payer: Self-pay

## 2019-05-02 ENCOUNTER — Emergency Department (HOSPITAL_COMMUNITY)
Admission: EM | Admit: 2019-05-02 | Discharge: 2019-05-04 | Disposition: A | Discharge status: Other Acute Inpatient Hospital | Payer: Medicaid Other | Attending: Emergency Medicine | Admitting: Emergency Medicine

## 2019-05-02 DIAGNOSIS — Y929 Unspecified place or not applicable: Secondary | ICD-10-CM | POA: Insufficient documentation

## 2019-05-02 DIAGNOSIS — Z87891 Personal history of nicotine dependence: Secondary | ICD-10-CM | POA: Insufficient documentation

## 2019-05-02 DIAGNOSIS — S51812A Laceration without foreign body of left forearm, initial encounter: Secondary | ICD-10-CM | POA: Insufficient documentation

## 2019-05-02 DIAGNOSIS — F332 Major depressive disorder, recurrent severe without psychotic features: Secondary | ICD-10-CM | POA: Insufficient documentation

## 2019-05-02 DIAGNOSIS — Z79899 Other long term (current) drug therapy: Secondary | ICD-10-CM | POA: Insufficient documentation

## 2019-05-02 DIAGNOSIS — Y939 Activity, unspecified: Secondary | ICD-10-CM | POA: Insufficient documentation

## 2019-05-02 DIAGNOSIS — X788XXA Intentional self-harm by other sharp object, initial encounter: Secondary | ICD-10-CM | POA: Insufficient documentation

## 2019-05-02 DIAGNOSIS — Y999 Unspecified external cause status: Secondary | ICD-10-CM | POA: Diagnosis not present

## 2019-05-02 DIAGNOSIS — R45851 Suicidal ideations: Secondary | ICD-10-CM | POA: Insufficient documentation

## 2019-05-02 DIAGNOSIS — Z20828 Contact with and (suspected) exposure to other viral communicable diseases: Secondary | ICD-10-CM | POA: Insufficient documentation

## 2019-05-02 DIAGNOSIS — J45909 Unspecified asthma, uncomplicated: Secondary | ICD-10-CM | POA: Diagnosis not present

## 2019-05-02 LAB — CBC
HCT: 36.1 % (ref 36.0–46.0)
Hemoglobin: 11.1 g/dL — ABNORMAL LOW (ref 12.0–15.0)
MCH: 22.8 pg — ABNORMAL LOW (ref 26.0–34.0)
MCHC: 30.7 g/dL (ref 30.0–36.0)
MCV: 74.1 fL — ABNORMAL LOW (ref 80.0–100.0)
Platelets: 489 10*3/uL — ABNORMAL HIGH (ref 150–400)
RBC: 4.87 MIL/uL (ref 3.87–5.11)
RDW: 18.1 % — ABNORMAL HIGH (ref 11.5–15.5)
WBC: 9.5 10*3/uL (ref 4.0–10.5)
nRBC: 0 % (ref 0.0–0.2)

## 2019-05-02 LAB — COMPREHENSIVE METABOLIC PANEL
ALT: 18 U/L (ref 0–44)
AST: 30 U/L (ref 15–41)
Albumin: 4.4 g/dL (ref 3.5–5.0)
Alkaline Phosphatase: 58 U/L (ref 38–126)
Anion gap: 12 (ref 5–15)
BUN: 7 mg/dL (ref 6–20)
CO2: 23 mmol/L (ref 22–32)
Calcium: 9.7 mg/dL (ref 8.9–10.3)
Chloride: 103 mmol/L (ref 98–111)
Creatinine, Ser: 1.08 mg/dL — ABNORMAL HIGH (ref 0.44–1.00)
GFR calc Af Amer: >60 mL/min (ref >60)
GFR calc non Af Amer: >60 mL/min (ref >60)
Glucose, Bld: 91 mg/dL (ref 70–99)
Potassium: 3.9 mmol/L (ref 3.5–5.1)
Sodium: 138 mmol/L (ref 135–145)
Total Bilirubin: 0.5 mg/dL (ref 0.3–1.2)
Total Protein: 8.1 g/dL (ref 6.5–8.1)

## 2019-05-02 LAB — I-STAT BETA HCG BLOOD, ED (MC, WL, AP ONLY): I-stat hCG, quantitative: <5 m[IU]/mL (ref <5)

## 2019-05-02 LAB — ETHANOL: Alcohol, Ethyl (B): 56 mg/dL — ABNORMAL HIGH (ref <10)

## 2019-05-02 LAB — SALICYLATE LEVEL: Salicylate Lvl: <7 mg/dL (ref 2.8–30.0)

## 2019-05-02 LAB — ACETAMINOPHEN LEVEL: Acetaminophen (Tylenol), Serum: <10 ug/mL — ABNORMAL LOW (ref 10–30)

## 2019-05-02 MED ORDER — ACETAMINOPHEN 325 MG PO TABS
650.0000 mg | ORAL_TABLET | ORAL | Status: DC | PRN
  Administered 2019-05-03: 01:00:00 650 mg via ORAL
  Filled 2019-05-02 (×2): qty 2

## 2019-05-02 MED ORDER — METHOTREXATE 2.5 MG PO TABS: 10.0000 mg | ORAL_TABLET | ORAL | Status: DC

## 2019-05-02 MED ORDER — PREDNISONE 20 MG PO TABS
10.0000 mg | ORAL_TABLET | Freq: Every day | ORAL | Status: DC
  Administered 2019-05-03: 10 mg via ORAL
  Filled 2019-05-02: qty 1

## 2019-05-02 MED ORDER — DICYCLOMINE HCL 10 MG PO CAPS
10.0000 mg | ORAL_CAPSULE | Freq: Three times a day (TID) | ORAL | Status: DC
  Filled 2019-05-02: qty 1

## 2019-05-02 MED ORDER — PREGABALIN 50 MG PO CAPS
150.0000 mg | ORAL_CAPSULE | Freq: Two times a day (BID) | ORAL | Status: DC
  Administered 2019-05-03 (×3): 150 mg via ORAL
  Filled 2019-05-02 (×3): qty 3

## 2019-05-02 MED ORDER — PANTOPRAZOLE SODIUM 20 MG PO TBEC
20.0000 mg | DELAYED_RELEASE_TABLET | Freq: Every day | ORAL | Status: DC
  Administered 2019-05-03: 20 mg via ORAL
  Filled 2019-05-02: qty 1

## 2019-05-02 MED ORDER — HYDROXYCHLOROQUINE SULFATE 200 MG PO TABS
200.0000 mg | ORAL_TABLET | Freq: Two times a day (BID) | ORAL | Status: DC
  Administered 2019-05-03 (×2): 200 mg via ORAL
  Filled 2019-05-02 (×3): qty 1

## 2019-05-02 MED ORDER — METHOTREXATE 2.5 MG PO TABS
10.0000 mg | ORAL_TABLET | ORAL | Status: DC
  Administered 2019-05-03: 10 mg via ORAL
  Filled 2019-05-02: qty 4

## 2019-05-02 MED ORDER — ONDANSETRON HCL 4 MG PO TABS
4.0000 mg | ORAL_TABLET | Freq: Three times a day (TID) | ORAL | Status: DC | PRN
  Administered 2019-05-03: 4 mg via ORAL
  Filled 2019-05-02: qty 1

## 2019-05-02 MED ORDER — LIDOCAINE-EPINEPHRINE (PF) 2 %-1:200000 IJ SOLN
20.0000 mL | Freq: Once | INTRAMUSCULAR | Status: AC
  Administered 2019-05-02: 10 mL
  Filled 2019-05-02: qty 20

## 2019-05-02 NOTE — ED Notes (Addendum)
All belongings given to patient's aunt.

## 2019-05-02 NOTE — ED Notes (Signed)
ED Provider at bedside. 

## 2019-05-02 NOTE — ED Triage Notes (Signed)
Pt bib ems for slitting her left wrist, did not specify to EMS that she was Suicidal. Laceration is superficial. VSS pt ambulatory.

## 2019-05-02 NOTE — ED Provider Notes (Signed)
[facility] EMERGENCY DEPARTMENT Provider Note   CSN: [id number] Arrival date & time: 05/02/19  2002     History   Chief Complaint Chief Complaint  Patient presents with  . Suicidal  . Lupus    HPI Molly Perkins is a 28 y.o. female.  She has a history of lupus and fibromyalgia PTSD.  She is complaining of feeling suicidal due to stress with family and her chronic pain.  She took a razor blade and cut her left wrist and forearm.  She denies any auditory or visual hallucinations.  No numbness or weakness.  Unknown last tetanus shot.     The history is provided by the patient.  Mental Health Problem Presenting symptoms: suicide attempt   Degree of incapacity (severity):  Unable to specify Onset quality:  Gradual Chronicity:  New Context: stressful life event   Relieved by:  None tried Worsened by:  Family interactions Ineffective treatments:  None tried Associated symptoms: no abdominal pain and no chest pain   Risk factors: hx of suicide attempts     Past Medical History:  Diagnosis Date  . Arthritis   . Asthma   . 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 Depressed   . Fibromyalgia   . GERD (gastroesophageal reflux disease)   . History of stomach ulcers   . Lupus ([place])   . Opiate abuse, continuous ([place])   . PTSD (post-traumatic stress disorder)     Patient Active Problem List   Diagnosis Date Noted  . Chest pain 08/11/2016  . Moderate recurrent major depression ([place]) 05/16/2015  . Fibromyalgia 10/19/2012  . Neuropathic pain of hand 10/19/2012  . Marijuana dependence ([place]) 05/04/2012    Class: Chronic    Past Surgical History:  Procedure Laterality Date  . NO PAST SURGERIES    . UPPER GI ENDOSCOPY       OB History    Gravida  0   Para  0   Term  0   Preterm  0   AB  0   Living  0     SAB  0   TAB  0   Ectopic  0   Multiple  0   Live Births               Home  Medications    Prior to Admission medications   Medication Sig Start Date End Date Taking? Authorizing Provider  aluminum-magnesium hydroxide-simethicone (MAALOX) 846-962-95 MG/5ML SUSP Take 30 mLs by mouth 4 (four) times daily -  before meals and at bedtime. 07/26/17   Hayden Rasmussen, MD  colchicine 0.6 MG tablet Take 0.6 mg by mouth daily. 11/09/15   [provider]  colchicine 0.6 MG tablet Take 1 tablet (0.6 mg total) by mouth daily. 04/15/17   Tegeler, Gwenyth Allegra, MD  colchicine 0.6 MG tablet Take 1 tablet (0.6 mg total) by mouth daily. 04/15/17   Little, Wenda Overland, MD  ferrous sulfate 325 (65 FE) MG tablet Take 1 tablet (325 mg total) by mouth daily. 09/11/15   Blanchie Dessert, MD  folic acid (FOLVITE) 1 MG tablet Take 1 mg by mouth daily.     [provider]  hydroxychloroquine (PLAQUENIL) 200 MG tablet Take 200 mg by mouth 2 (two) times daily. 11/12/15   [provider]  hydroxychloroquine (PLAQUENIL) 200 MG tablet Take 200 mg by mouth. 11/12/15   [provider]  indomethacin (INDOCIN) 25 MG capsule Take  25 mg by mouth 2 (two) times daily.     [provider]  methotrexate (RHEUMATREX) 2.5 MG tablet Take 10 mg by mouth once a week. Caution:Chemotherapy. Protect from light.    [provider]  methotrexate (RHEUMATREX) 5 MG tablet Take by mouth.    [provider]  metoCLOPramide (REGLAN) 10 MG tablet Take 1 tablet (10 mg total) by mouth every 6 (six) hours. 07/26/17   Nira Conn, MD  ondansetron (ZOFRAN ODT) 4 MG disintegrating tablet Take 1 tablet (4 mg total) by mouth every 8 (eight) hours as needed for nausea or vomiting. 04/15/17   Little, Ambrose Finland, MD  ondansetron (ZOFRAN) 4 MG tablet Take 1 tablet (4 mg total) by mouth every 8 (eight) hours as needed for nausea or vomiting. 07/26/17   Terrilee Files, MD  oxyCODONE-acetaminophen (PERCOCET) 5-325 MG tablet Take 1 tablet by mouth every 4 (four) hours as  needed. 04/15/17   Little, Ambrose Finland, MD  oxyCODONE-acetaminophen (PERCOCET/ROXICET) 5-325 MG tablet Take 1 tablet by mouth every 4 (four) hours as needed for severe pain. 04/15/17   Tegeler, Canary Brim, MD  pantoprazole (PROTONIX) 20 MG tablet Take 20 mg by mouth 2 (two) times daily.    [provider]  pantoprazole (PROTONIX) 40 MG tablet Take 40 mg by mouth. 08/09/16   [provider]  ranitidine (ZANTAC) 75 MG tablet Take 75 mg by mouth. 11/09/15   [provider]  sertraline (ZOLOFT) 25 MG tablet Take 25 mg by mouth daily.    [provider]  sucralfate (CARAFATE) 1 g tablet Take 1 tablet (1 g total) by mouth 4 (four) times daily. 08/11/16 08/11/17  Governor Rooks, MD  traMADol (ULTRAM) 50 MG tablet Take 50 mg by mouth. 06/11/16   [provider]  promethazine (PHENERGAN) 25 MG tablet Take 0.5-1 tablets (12.5-25 mg total) by mouth every 6 (six) hours as needed. Patient not taking: Reported on 11/21/2015 03/08/15 11/21/15  Hurshel Party, CNM    Family History Family History  Problem Relation Age of Onset  . Fibromyalgia Mother     Social History Social History   Tobacco Use  . Smoking status: Former Smoker    Packs/day: 0.50    Years: 3.00    Pack years: 1.50    Types: Cigarettes    Quit date: 02/16/2014    Years since quitting: 5.2  . Smokeless tobacco: Never Used  Substance Use Topics  . Alcohol use: No    Comment: denies current use  . Drug use: No    Frequency: 5.0 times per week    Types: Marijuana    Comment: denies current use     Allergies   Bee venom, Corn-containing products, Fish-derived products, and Soy allergy   Review of Systems Review of Systems  Constitutional: Negative for fever.  HENT: Negative for sore throat.   Eyes: Negative for visual disturbance.  Respiratory: Negative for shortness of breath.   Cardiovascular: Negative for chest pain.  Gastrointestinal: Negative for abdominal pain.   Genitourinary: Negative for dysuria.  Musculoskeletal: Positive for myalgias.  Skin: Positive for wound.  Neurological: Negative for speech difficulty.     Physical Exam Updated Vital Signs BP 122/83 (BP Location: Right Arm)   Pulse 86   Temp 99.5 F (37.5 C) (Oral)   Resp 18   SpO2 100%   Physical Exam Vitals signs and nursing note reviewed.  Constitutional:      General: She is not in acute  distress.    Appearance: She is well-developed.  HENT:     Head: Normocephalic and atraumatic.  Eyes:     Conjunctiva/sclera: Conjunctivae normal.  Neck:     Musculoskeletal: Neck supple.  Cardiovascular:     Rate and Rhythm: Normal rate and regular rhythm.     Heart sounds: No murmur.  Pulmonary:     Effort: Pulmonary effort is normal. No respiratory distress.     Breath sounds: Normal breath sounds.  Abdominal:     Palpations: Abdomen is soft.     Tenderness: There is no abdominal tenderness.  Musculoskeletal: Normal range of motion.        General: Signs of injury present.     Comments: She is approximately 10 cm superficial laceration left forearm.  Distal neurovascular intact.  Radial pulse 2+.  Skin:    General: Skin is warm and dry.     Capillary Refill: Capillary refill takes less than 2 seconds.  Neurological:     General: No focal deficit present.     Mental Status: She is alert.      ED Treatments / Results  Labs (all labs ordered are listed, but only abnormal results are displayed) Labs Reviewed  COMPREHENSIVE METABOLIC PANEL - Abnormal; Notable for the following components:      Result Value   Creatinine, Ser 1.08 (*)    All other components within normal limits  ETHANOL - Abnormal; Notable for the following components:   Alcohol, Ethyl (B) 56 (*)    All other components within normal limits  ACETAMINOPHEN LEVEL - Abnormal; Notable for the following components:   Acetaminophen (Tylenol), Serum <10 (*)    All other components within normal limits  CBC -  Abnormal; Notable for the following components:   Hemoglobin 11.1 (*)    MCV 74.1 (*)    MCH 22.8 (*)    RDW 18.1 (*)    Platelets 489 (*)    All other components within normal limits  SALICYLATE LEVEL  RAPID URINE DRUG SCREEN, HOSP PERFORMED  I-STAT BETA HCG BLOOD, ED ([facility], [facility], [facility] ONLY)    EKG None  Radiology No results found.  Procedures .Marland Kitchen.Laceration Repair  Date/Time: 05/02/2019 11:15 PM Performed by: Terrilee FilesButler,  C, MD Authorized by: Terrilee FilesButler,  C, MD   Consent:    Consent obtained:  Verbal   Consent given by:  Patient   Risks discussed:  Infection, pain, poor cosmetic result, poor wound healing and retained foreign body   Alternatives discussed:  No treatment and delayed treatment Laceration details:    Location:  Shoulder/arm   Shoulder/arm location:  L lower arm   Length (cm):  10 Repair type:    Repair type:  Simple Exploration:    Wound extent: no foreign bodies/material noted     Contaminated: no   Treatment:    Area cleansed with:  Saline Skin repair:    Repair method:  Sutures   Suture size:  4-0   Suture material:  Nylon   Suture technique:  Simple interrupted   Number of sutures:  8 Approximation:    Approximation:  Close Post-procedure details:    Dressing:  Non-adherent dressing   Patient tolerance of procedure:  Tolerated well, no immediate complications   (including critical care time)  Medications Ordered in ED Medications  lidocaine-EPINEPHrine (XYLOCAINE W/EPI) 2 %-1:200000 (PF) injection 20 mL (has no administration in time range)     Initial Impression / Assessment and Plan / ED Course  I  have reviewed the triage vital signs and the nursing notes.  Pertinent labs & imaging results that were available during my care of the patient were reviewed by me and considered in my medical decision making (see chart for details).        Final Clinical Impressions(s) / ED Diagnoses   Final diagnoses:  Suicidal ideation  Forearm  laceration, left, initial encounter    ED Discharge Orders    None       Terrilee Files, MD 05/02/19 2354

## 2019-05-02 NOTE — ED Notes (Signed)
Pharmacy will do Med reconcile for home meds to be ordered-Monique,RN

## 2019-05-02 NOTE — ED Notes (Signed)
Blood seeping through bandage on left wrist.  This RN changed bandage and rewrapped wound.

## 2019-05-03 ENCOUNTER — Other Ambulatory Visit: Payer: Self-pay

## 2019-05-03 LAB — RAPID URINE DRUG SCREEN, HOSP PERFORMED
Amphetamines: NOT DETECTED
Barbiturates: NOT DETECTED
Benzodiazepines: POSITIVE — AB
Cocaine: NOT DETECTED
Opiates: NOT DETECTED
Tetrahydrocannabinol: NOT DETECTED

## 2019-05-03 LAB — SARS CORONAVIRUS 2 (TAT 6-24 HRS): SARS Coronavirus 2: NEGATIVE

## 2019-05-03 MED ORDER — NICOTINE 21 MG/24HR TD PT24
21.0000 mg | MEDICATED_PATCH | Freq: Once | TRANSDERMAL | Status: DC
  Administered 2019-05-03: 21 mg via TRANSDERMAL
  Filled 2019-05-03: qty 1

## 2019-05-03 MED ORDER — HYDROCODONE-ACETAMINOPHEN 5-325 MG PO TABS
1.0000 | ORAL_TABLET | Freq: Four times a day (QID) | ORAL | Status: DC | PRN
  Administered 2019-05-03 (×2): 1 via ORAL
  Filled 2019-05-03 (×3): qty 1

## 2019-05-03 MED ORDER — PREDNISONE 20 MG PO TABS
40.0000 mg | ORAL_TABLET | Freq: Every day | ORAL | Status: DC
  Administered 2019-05-03: 40 mg via ORAL
  Filled 2019-05-03: qty 2

## 2019-05-03 NOTE — ED Notes (Signed)
Clinical research associate for transport to [facility]. They are on a call right now and will call this RN when available.

## 2019-05-03 NOTE — ED Notes (Signed)
Fax issues, called Pt placement to let them know there is a signed consent for [facility]. She will contact them.

## 2019-05-03 NOTE — ED Notes (Signed)
TTS in process 

## 2019-05-03 NOTE — ED Notes (Signed)
Dr Alvino Chapel in w/pt.

## 2019-05-03 NOTE — ED Notes (Signed)
This Rn spoke with Dr Alvino Chapel about pt's request for stronger pain medication. He is going to place order for hydrocodone q6h.

## 2019-05-03 NOTE — ED Notes (Signed)
This RN in room with pt and pt asleep.

## 2019-05-03 NOTE — ED Notes (Signed)
Voluntary consent faxed to [facility].

## 2019-05-03 NOTE — ED Notes (Signed)
Pt c/o feels as if experiencing Lupus flare - c/o abd pain w/diarrhea, open sores. States "I know what's coming next will be chest pain". States has been hospitalized for same multiple times - last was 4 months ago at [facility]. States she has been experiencing a lot of stress lately which causes her Lupus to flare. Message sent to Dr Alvino Chapel.

## 2019-05-03 NOTE — ED Provider Notes (Signed)
  Physical Exam  BP 111/74 (BP Location: Right Arm)   Pulse 79   Temp 98.7 F (37.1 C) (Oral)   Resp 18   SpO2 99%   Physical Exam  ED Course/Procedures     Procedures  MDM  Called in to see patient.  Patient thinks her lupus is flaring up.  States she gets muscle aches and other symptoms when she flares up.  States she has been taking her medicines but stress also flares her up.  Will increase steroids and will need to follow with her rheumatologist.  Will give 40 mg of prednisone for 10 days.       Davonna Belling, MD 05/03/19 1352

## 2019-05-03 NOTE — BH Assessment (Signed)
Patient has been accepted to [facility].  Accepting physician is Dr. Dwyane Dee. Admission orders will be entered by Marvia Pickles, Nurse Practitioner  Attending Physician will be Dr. Weber Cooks.  Patient has been assigned to room 311, by Byars F.  Call report to [phone].  Representative/Transfer Coordinator is Keo Schirmer Patient pre-admitted by [facility] Patient Access Mackey [place])  [facility] Staff Marlou Porch, TTS/ Disposition) made aware of acceptance.

## 2019-05-03 NOTE — ED Notes (Signed)
Patient denies pain and is resting comfortably.  

## 2019-05-03 NOTE — BH Assessment (Signed)
Tele Assessment Note   Patient Name: Molly Perkins MRN: [id number] Referring Physician: Dr. Dwana Melena Location of Patient: [facility] Location of Provider: Behavioral Health TTS Department  Molly Perkins is an 28 y.o. female brought in by EMS due to patient slitting her left wrist. Laceration is superficial. Shelter staff became aware of patient cutting herself and called EMS. When asked patient if this was a suicide attempt, patient stated, "no, I did it because I was in physical and emotional pain. Patient denied SI and asking clinician if she could go home. Patient is currently complaining of physical pain for Lupus. Patient has a history of lupus and fibromyalgia PTSD.  Per EDP note, patient was complaining of feeling suicidal due to stress with family and her chronic pain.  She took a razor blade and cut her left wrist and forearm.  She denies any auditory or visual hallucinations. Patient denied history and present outpatient services. Patient denied SI, HI, psychosis, alcohol/drug usage. BAL 56 and UDS has not resulted.  Patient initially reported lived with mother and 100 year old brother, however now patient is stating that she lives in a shelter and that there is a lot going on right now. Patient is currently a Consulting civil engineer at [employer]. Patient would not share details with clinician. Patient was not forthcoming with information during assessment and continued to ask to leave the hospital.   Diagnosis: Major depressive disorder  Past Medical History:  Past Medical History:  Diagnosis Date  . Arthritis   . Asthma   . 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 Depressed   . Fibromyalgia   . GERD (gastroesophageal reflux disease)   . History of stomach ulcers   . Lupus (HCC)   . Opiate abuse, continuous (HCC)   . PTSD (post-traumatic stress disorder)     Past Surgical History:  Procedure Laterality Date  . NO PAST  SURGERIES    . UPPER GI ENDOSCOPY      Family History:  Family History  Problem Relation Age of Onset  . Fibromyalgia Mother     Social History:  reports that she quit smoking about 5 years ago. Her smoking use included cigarettes. She has a 1.50 pack-year smoking history. She has never used smokeless tobacco. She reports that she does not drink alcohol or use drugs.  Additional Social History:  Alcohol / Drug Use Pain Medications: see MAR Prescriptions: see MAR Over the Counter: see MAR  CIWA: CIWA-Ar BP: 122/83 Pulse Rate: 86 COWS:    Allergies:  Allergies  Allergen Reactions  . Bee Venom Anaphylaxis    Has epi-pen  . Corn-Containing Products     Stomach pain  . Fish-Derived Products     Stomach pain  . Soy Allergy     Stomach pain    Home Medications: (Not in a hospital admission)   OB/GYN Status:  No LMP recorded.  General Assessment Data Location of Assessment: [facility] ED TTS Assessment: In system Is this a Tele or Face-to-Face Assessment?: Tele Assessment Is this an Initial Assessment or a Re-assessment for this encounter?: Initial Assessment Patient Accompanied by:: N/A Language Other than English: No Living Arrangements: Homeless/Shelter What gender do you identify as?: Female Marital status: Single Living Arrangements: (shelter) Can pt return to current living arrangement?: Yes Admission Status: Voluntary Is patient capable of signing voluntary admission?: Yes Referral Source: Self/Family/Friend Insurance type: Marland Kitchen     Crisis Care Plan Living Arrangements: (shelter)  Legal Guardian: (self) Name of Psychiatrist: (none) Name of Therapist: (none)  Education Status Is patient currently in school?: Yes Current Grade: (unknown) 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 : No Suicidal Intent: No Has patient had any suicidal intent within the past 6 months prior to admission? : No Is patient at risk for suicide?: No Suicidal Plan?: No Has patient had any suicidal plan within the past 6 months prior to admission? : No Access to Means: No What has been your use of drugs/alcohol within the last 12 months?: ("I dont walk  ) Previous Attempts/Gestures: No How many times?: (0) Other Self Harm Risks: (none reported) Triggers for Past Attempts: (n/a) Intentional Self Injurious Behavior: Cutting(1x today) Comment - Self Injurious Behavior: (none reported) Family Suicide History: Unknown Recent stressful life event(s): Other (Comment)(family conflict) Persecutory voices/beliefs?: No Depression Symptoms: Insomnia, Tearfulness, Fatigue, Isolating, Guilt, Loss of interest in usual pleasures, Feeling worthless/self pity, Feeling angry/irritable Substance abuse history and/or treatment for substance abuse?: No Suicide prevention information given to non-admitted patients: Not applicable  Risk to Others within the past 6 months Homicidal Ideation: No Does patient have any lifetime risk of violence toward others beyond the six months prior to admission? : No Thoughts of Harm to Others: No Current Homicidal Intent: No Current Homicidal Plan: No Access to Homicidal Means: No History of harm to others?: No Assessment of Violence: None Noted Does patient have access to weapons?: No Criminal Charges Pending?: No Does patient have a court date: Yes Court Date: (Illegal turns ticket) Is patient on probation?: Yes  Psychosis Hallucinations: None noted Delusions: None noted  Mental Status Report Appearance/Hygiene: Unremarkable Eye Contact: Fair Motor Activity: Restlessness Speech: Logical/coherent Level of Consciousness: Alert Mood: Anxious Affect: Appropriate to circumstance, Depressed Anxiety Level: Moderate Thought Processes:  Coherent, Relevant, Irrelevant, Circumstantial Judgement: Impaired Obsessive Compulsive Thoughts/Behaviors: None  Cognitive Functioning Concentration: Poor Is patient IDD: No Insight: Fair Impulse Control: Poor Appetite: Poor Have you had any weight changes? : No Change Sleep: No Change Total Hours of Sleep: (8-9) Vegetative Symptoms: Not bathing, Staying in bed  ADLScreening [facility] Assessment Services) Patient's cognitive ability adequate to safely complete daily activities?: Yes Patient able to express need for assistance with ADLs?: Yes Independently performs ADLs?: Yes (appropriate for developmental age)  Prior Inpatient Therapy Prior Inpatient Therapy: No  Prior Outpatient Therapy Prior Outpatient Therapy: No Does patient have an ACCT team?: No Does patient have Intensive In-House Services?  : No Does patient have [facility] services? : No Does patient have P4CC services?: No  ADL Screening (condition at time of admission) Patient's cognitive ability adequate to safely complete daily activities?: Yes Patient able to express need for assistance with ADLs?: Yes Independently performs ADLs?: Yes (appropriate for developmental age)  Disposition:  Disposition Initial Assessment Completed for this Encounter: Yes  Renetta Chalk, NP, patient meets inpatient criteria. No beds available at [facility] TTS to secure placement.  This service was provided via telemedicine using a 2-way, interactive audio and video technology.  Names of all persons participating in this telemedicine service and their role in this encounter. Name: Molly Perkins  Role: Patient  Name: Kirtland Bouchard Role: TTS Clinician  Name:   Role:   Name:   Role:    Venora Maples 05/03/2019  2:06 AM

## 2019-05-04 ENCOUNTER — Inpatient Hospital Stay
Admission: AD | Admit: 2019-05-04 | Discharge: 2019-05-07 | DRG: 885 | Disposition: A | Discharge status: Home / Self Care | Payer: Medicaid Other | Source: Intra-hospital | Attending: Psychiatry | Admitting: Psychiatry

## 2019-05-04 ENCOUNTER — Inpatient Hospital Stay: Payer: Medicaid Other

## 2019-05-04 ENCOUNTER — Other Ambulatory Visit: Payer: Self-pay

## 2019-05-04 DIAGNOSIS — X789XXA Intentional self-harm by unspecified sharp object, initial encounter: Secondary | ICD-10-CM | POA: Diagnosis present

## 2019-05-04 DIAGNOSIS — M797 Fibromyalgia: Secondary | ICD-10-CM | POA: Diagnosis present

## 2019-05-04 DIAGNOSIS — S61512A Laceration without foreign body of left wrist, initial encounter: Secondary | ICD-10-CM

## 2019-05-04 DIAGNOSIS — G4700 Insomnia, unspecified: Secondary | ICD-10-CM | POA: Diagnosis present

## 2019-05-04 DIAGNOSIS — F322 Major depressive disorder, single episode, severe without psychotic features: Secondary | ICD-10-CM | POA: Diagnosis not present

## 2019-05-04 DIAGNOSIS — M329 Systemic lupus erythematosus, unspecified: Secondary | ICD-10-CM | POA: Diagnosis present

## 2019-05-04 DIAGNOSIS — R45851 Suicidal ideations: Secondary | ICD-10-CM | POA: Diagnosis present

## 2019-05-04 DIAGNOSIS — D649 Anemia, unspecified: Secondary | ICD-10-CM | POA: Diagnosis present

## 2019-05-04 DIAGNOSIS — R079 Chest pain, unspecified: Secondary | ICD-10-CM

## 2019-05-04 DIAGNOSIS — F4310 Post-traumatic stress disorder, unspecified: Secondary | ICD-10-CM | POA: Diagnosis present

## 2019-05-04 DIAGNOSIS — G894 Chronic pain syndrome: Secondary | ICD-10-CM | POA: Diagnosis present

## 2019-05-04 DIAGNOSIS — Z20828 Contact with and (suspected) exposure to other viral communicable diseases: Secondary | ICD-10-CM | POA: Diagnosis present

## 2019-05-04 DIAGNOSIS — IMO0002 Reserved for concepts with insufficient information to code with codable children: Secondary | ICD-10-CM

## 2019-05-04 DIAGNOSIS — Z87891 Personal history of nicotine dependence: Secondary | ICD-10-CM | POA: Diagnosis not present

## 2019-05-04 LAB — COMPREHENSIVE METABOLIC PANEL
ALT: 15 U/L (ref 0–44)
AST: 22 U/L (ref 15–41)
Albumin: 4.3 g/dL (ref 3.5–5.0)
Alkaline Phosphatase: 60 U/L (ref 38–126)
Anion gap: 9 (ref 5–15)
BUN: 10 mg/dL (ref 6–20)
CO2: 26 mmol/L (ref 22–32)
Calcium: 9.6 mg/dL (ref 8.9–10.3)
Chloride: 102 mmol/L (ref 98–111)
Creatinine, Ser: 1.04 mg/dL — ABNORMAL HIGH (ref 0.44–1.00)
GFR calc Af Amer: >60 mL/min (ref >60)
GFR calc non Af Amer: >60 mL/min (ref >60)
Glucose, Bld: 126 mg/dL — ABNORMAL HIGH (ref 70–99)
Potassium: 3.7 mmol/L (ref 3.5–5.1)
Sodium: 137 mmol/L (ref 135–145)
Total Bilirubin: 0.4 mg/dL (ref 0.3–1.2)
Total Protein: 8.4 g/dL — ABNORMAL HIGH (ref 6.5–8.1)

## 2019-05-04 LAB — CBC WITH DIFFERENTIAL/PLATELET
Abs Immature Granulocytes: 0.02 10*3/uL (ref 0.00–0.07)
Basophils Absolute: 0 10*3/uL (ref 0.0–0.1)
Basophils Relative: 0 %
Eosinophils Absolute: 0 10*3/uL (ref 0.0–0.5)
Eosinophils Relative: 0 %
HCT: 38.1 % (ref 36.0–46.0)
Hemoglobin: 11.4 g/dL — ABNORMAL LOW (ref 12.0–15.0)
Immature Granulocytes: 0 %
Lymphocytes Relative: 18 %
Lymphs Abs: 1.3 10*3/uL (ref 0.7–4.0)
MCH: 21.8 pg — ABNORMAL LOW (ref 26.0–34.0)
MCHC: 29.9 g/dL — ABNORMAL LOW (ref 30.0–36.0)
MCV: 72.8 fL — ABNORMAL LOW (ref 80.0–100.0)
Monocytes Absolute: 0.4 10*3/uL (ref 0.1–1.0)
Monocytes Relative: 5 %
Neutro Abs: 5.5 10*3/uL (ref 1.7–7.7)
Neutrophils Relative %: 77 %
Platelets: 497 10*3/uL — ABNORMAL HIGH (ref 150–400)
RBC: 5.23 MIL/uL — ABNORMAL HIGH (ref 3.87–5.11)
RDW: 18.3 % — ABNORMAL HIGH (ref 11.5–15.5)
WBC: 7.2 10*3/uL (ref 4.0–10.5)
nRBC: 0 % (ref 0.0–0.2)

## 2019-05-04 LAB — SEDIMENTATION RATE: Sed Rate: 24 mm/hr — ABNORMAL HIGH (ref 0–20)

## 2019-05-04 LAB — MAGNESIUM: Magnesium: 2.3 mg/dL (ref 1.7–2.4)

## 2019-05-04 LAB — TROPONIN I (HIGH SENSITIVITY)
Troponin I (High Sensitivity): <2 ng/L (ref <18)
Troponin I (High Sensitivity): <2 ng/L (ref <18)

## 2019-05-04 LAB — C-REACTIVE PROTEIN: CRP: <0.8 mg/dL (ref <1.0)

## 2019-05-04 MED ORDER — PREDNISONE 10 MG PO TABS
40.0000 mg | ORAL_TABLET | Freq: Every day | ORAL | Status: DC
  Administered 2019-05-04 – 2019-05-07 (×4): 40 mg via ORAL
  Filled 2019-05-04 (×4): qty 4

## 2019-05-04 MED ORDER — ACETAMINOPHEN 325 MG PO TABS
650.0000 mg | ORAL_TABLET | Freq: Four times a day (QID) | ORAL | Status: DC | PRN
  Administered 2019-05-04 – 2019-05-05 (×3): 650 mg via ORAL
  Filled 2019-05-04 (×3): qty 2

## 2019-05-04 MED ORDER — INDOMETHACIN 25 MG PO CAPS
25.0000 mg | ORAL_CAPSULE | Freq: Two times a day (BID) | ORAL | Status: DC
  Filled 2019-05-04 (×7): qty 1

## 2019-05-04 MED ORDER — DICYCLOMINE HCL 10 MG PO CAPS
10.0000 mg | ORAL_CAPSULE | Freq: Three times a day (TID) | ORAL | Status: DC
  Filled 2019-05-04 (×4): qty 1

## 2019-05-04 MED ORDER — ALUM & MAG HYDROXIDE-SIMETH 200-200-20 MG/5ML PO SUSP: 30.0000 mL | ORAL | Status: DC | PRN

## 2019-05-04 MED ORDER — IBUPROFEN 600 MG PO TABS
600.0000 mg | ORAL_TABLET | Freq: Four times a day (QID) | ORAL | Status: DC | PRN
  Filled 2019-05-04: qty 3

## 2019-05-04 MED ORDER — COLCHICINE 0.6 MG PO TABS
0.6000 mg | ORAL_TABLET | Freq: Every day | ORAL | Status: DC
  Administered 2019-05-04 – 2019-05-07 (×4): 0.6 mg via ORAL
  Filled 2019-05-04 (×4): qty 1

## 2019-05-04 MED ORDER — TRAMADOL HCL 50 MG PO TABS
100.0000 mg | ORAL_TABLET | Freq: Four times a day (QID) | ORAL | Status: DC | PRN
  Administered 2019-05-04 – 2019-05-07 (×10): 100 mg via ORAL
  Filled 2019-05-04 (×10): qty 2

## 2019-05-04 MED ORDER — PREGABALIN 25 MG PO CAPS
150.0000 mg | ORAL_CAPSULE | Freq: Two times a day (BID) | ORAL | Status: DC
  Administered 2019-05-04 – 2019-05-05 (×3): 150 mg via ORAL
  Filled 2019-05-04 (×3): qty 6

## 2019-05-04 MED ORDER — HYDROXYCHLOROQUINE SULFATE 200 MG PO TABS
200.0000 mg | ORAL_TABLET | Freq: Two times a day (BID) | ORAL | Status: DC
  Administered 2019-05-04 – 2019-05-07 (×7): 200 mg via ORAL
  Filled 2019-05-04 (×10): qty 1

## 2019-05-04 MED ORDER — MAGNESIUM HYDROXIDE 400 MG/5ML PO SUSP: 30.0000 mL | Freq: Every day | ORAL | Status: DC | PRN

## 2019-05-04 MED ORDER — SERTRALINE HCL 25 MG PO TABS
25.0000 mg | ORAL_TABLET | Freq: Every day | ORAL | Status: DC
  Administered 2019-05-04 – 2019-05-05 (×2): 25 mg via ORAL
  Filled 2019-05-04 (×2): qty 1

## 2019-05-04 NOTE — BHH Group Notes (Signed)
LCSW Group Therapy Note  05/04/2019 11:59 AM  Type of Therapy and Topic:  Group Therapy:  Feelings around Relapse and Recovery  Participation Level:  Did Not Attend   Description of Group:    Patients in this group will discuss emotions they experience before and after a relapse. They will process how experiencing these feelings, or avoidance of experiencing them, relates to having a relapse. Facilitator will guide patients to explore emotions they have related to recovery. Patients will be encouraged to process which emotions are more powerful. They will be guided to discuss the emotional reaction significant others in their lives may have to their relapse or recovery. Patients will be assisted in exploring ways to respond to the emotions of others without this contributing to a relapse.  Therapeutic Goals: 1. Patient will identify two or more emotions that lead to a relapse for them 2. Patient will identify two emotions that result when they relapse 3. Patient will identify two emotions related to recovery 4. Patient will demonstrate ability to communicate their needs through discussion and/or role plays   Summary of Patient Progress: x    Therapeutic Modalities:   Cognitive Behavioral Therapy Solution-Focused Therapy Assertiveness Training Relapse Prevention Therapy   Chai Verdejo, MSW, LCSW Clinical Social Work 05/04/2019 11:59 AM   

## 2019-05-04 NOTE — ED Notes (Signed)
Safety Transport here for pt. All belongings given to driver.

## 2019-05-04 NOTE — BHH Suicide Risk Assessment (Signed)
[facility] Admission Suicide Risk Assessment   Nursing information obtained from:  Patient Demographic factors:  Low socioeconomic status, Adolescent or young adult Current Mental Status:  NA Loss Factors:  Decline in physical health, Financial problems / change in socioeconomic status, Loss of significant relationship Historical Factors:  Impulsivity, Family history of mental illness or substance abuse Risk Reduction Factors:  Religious beliefs about death, Positive social support  Total Time spent with patient: 1 hour Principal Problem: MDD (major depressive disorder), severe ([place]) Diagnosis:  Principal Problem:   MDD (major depressive disorder), severe ([place]) Active Problems:   Fibromyalgia   Lupus ([place])   Self-inflicted laceration of left wrist ([place])  Subjective Data: Patient seen chart reviewed.  Patient transferred to [place] from [place] [employer].  She cut her wrist in a fit of anger and depression towards her mother.  She says at the time that she did it she initially did not think she was trying to kill her self but once she saw a little bit of blood she decided to cut more and kill her self.  Only when she really started to bleed much more did she change her mind and get help.  Patient describes her mood as being down and sad and angry and depressed for a couple weeks.  Says that she sleeps okay.  Appetite okay.  She says that she is in the midst of a flare of her lupus that has been going on for about a week.  She is having pain all over in multiple joints.  She also describes a skin pain in her torso.  She also says that she was wheezing when she first came into the hospital.  Patient denies hallucinations.  Denies homicidal ideation.  Continued Clinical Symptoms:  Alcohol Use Disorder Identification Test Final Score (AUDIT): 2 The "Alcohol Use Disorders Identification Test", Guidelines for Use in Primary Care, Second Edition.  World Pharmacologist [facility]). Score between 0-7:  no or low risk or  alcohol related problems. Score between 8-15:  moderate risk of alcohol related problems. Score between 16-19:  high risk of alcohol related problems. Score 20 or above:  warrants further diagnostic evaluation for alcohol dependence and treatment.   CLINICAL FACTORS:   Depression:   Impulsivity   Musculoskeletal: Strength & Muscle Tone: within normal limits Gait & Station: unsteady Patient leans: N/A  Psychiatric Specialty Exam: Physical Exam  Nursing note and vitals reviewed. Constitutional: She appears well-developed and well-nourished.  HENT:  Head: Normocephalic and atraumatic.  Eyes: Pupils are equal, round, and reactive to light. Conjunctivae are normal.  Neck: Normal range of motion.  Cardiovascular: Regular rhythm and normal heart sounds.  Respiratory: Effort normal. No respiratory distress.  GI: Soft.  Musculoskeletal: Normal range of motion.  Neurological: She is alert.  Skin: Skin is warm and dry.  Psychiatric: Her affect is blunt. Her speech is delayed. She is slowed. Cognition and memory are normal. She expresses impulsivity. She exhibits a depressed mood. She expresses suicidal ideation. She expresses no suicidal plans.    Review of Systems  Constitutional: Positive for malaise/fatigue.  HENT: Negative.   Eyes: Negative.   Respiratory: Negative.   Cardiovascular: Negative.   Gastrointestinal: Negative.   Musculoskeletal: Positive for joint pain, myalgias and neck pain.  Skin: Negative.   Neurological: Negative.   Psychiatric/Behavioral: Positive for depression and suicidal ideas. Negative for hallucinations, memory loss and substance abuse. The patient is nervous/anxious. The patient does not have insomnia.     Blood pressure 118/73, pulse  77, temperature 98.1 F (36.7 C), temperature source Oral, resp. rate 16, height 5\' 4"  (1.626 m), weight 66.2 kg, SpO2 100 %.Body mass index is 25.06 kg/m.  General Appearance: Casual  Eye Contact:  Minimal  Speech:   Slow  Volume:  Decreased  Mood:  Anxious and Depressed  Affect:  Blunt  Thought Process:  Goal Directed  Orientation:  Full (Time, Place, and Person)  Thought Content:  Logical and Rumination  Suicidal Thoughts:  Yes.  without intent/plan  Homicidal Thoughts:  No  Memory:  Immediate;   Fair Recent;   Fair Remote;   Fair  Judgement:  Fair  Insight:  Fair  Psychomotor Activity:  Decreased  Concentration:  Concentration: Poor  Recall:  of Knowledge:  Fair  Language:  Fair  Akathisia:  No  Handed:  Right  AIMS (if indicated):     Assets:  Desire for Improvement Resilience Social Support  ADL's:  Impaired  Cognition:  Impaired,  Mild  Sleep:  Number of Hours: 0      COGNITIVE FEATURES THAT CONTRIBUTE TO RISK:  Loss of executive function    SUICIDE RISK:   Mild:  Suicidal ideation of limited frequency, intensity, duration, and specificity.  There are no identifiable plans, no associated intent, mild dysphoria and related symptoms, good self-control (both objective and subjective assessment), few other risk factors, and identifiable protective factors, including available and accessible social support.  PLAN OF CARE: Continue 15-minute checks.  Restart antidepressant.  Try to manage medical problems including pain.  Include if possible and group and individual therapy.  Reassess suicidality prior to any discharge plan.  I certify that inpatient services furnished can reasonably be expected to improve the patient's condition.   [employer], MD 05/04/2019, 1:17 PM

## 2019-05-04 NOTE — Plan of Care (Addendum)
Pt rates depression 10/10 and anxiety 3/10. Pt was educated on care plan and verbalizes understanding. Collier Bullock RN Problem: Education: Goal: Knowledge of [facility] Education information/materials will improve Outcome: Progressing Goal: Emotional status will improve Outcome: Progressing Goal: Mental status will improve Outcome: Progressing Goal: Verbalization of understanding the information provided will improve Outcome: Progressing   Problem: Activity: Goal: Interest or engagement in activities will improve Outcome: Progressing Goal: Sleeping patterns will improve Outcome: Progressing   Problem: Coping: Goal: Ability to verbalize frustrations and anger appropriately will improve Outcome: Progressing Goal: Ability to demonstrate self-control will improve Outcome: Progressing   Problem: Health Behavior/Discharge Planning: Goal: Identification of resources available to assist in meeting health care needs will improve Outcome: Progressing Goal: Compliance with treatment plan for underlying cause of condition will improve Outcome: Progressing   Problem: Physical Regulation: Goal: Ability to maintain clinical measurements within normal limits will improve Outcome: Progressing   Problem: Coping: Goal: Coping ability will improve Outcome: Progressing   Problem: Education: Goal: Ability to make informed decisions regarding treatment will improve Outcome: Progressing   Problem: Health Behavior/Discharge Planning: Goal: Identification of resources available to assist in meeting health care needs will improve Outcome: Progressing   Problem: Self-Concept: Goal: Ability to disclose and discuss suicidal ideas will improve Outcome: Progressing Goal: Will verbalize positive feelings about self Outcome: Progressing   Problem: Education: Goal: Utilization of techniques to improve thought processes will improve Outcome: Progressing Goal: Knowledge of the prescribed  therapeutic regimen will improve Outcome: Progressing   Problem: Activity: Goal: Interest or engagement in leisure activities will improve Outcome: Progressing Goal: Imbalance in normal sleep/wake cycle will improve Outcome: Progressing   Problem: Coping: Goal: Coping ability will improve Outcome: Progressing Goal: Will verbalize feelings Outcome: Progressing   Problem: Role Relationship: Goal: Will demonstrate positive changes in social behaviors and relationships Outcome: Progressing   Problem: Safety: Goal: Ability to disclose and discuss suicidal ideas will improve Outcome: Progressing Goal: Ability to identify and utilize support systems that promote safety will improve Outcome: Progressing

## 2019-05-04 NOTE — BHH Counselor (Signed)
Adult Comprehensive Assessment  Patient ID: Niger C Marini, female   DOB: 1990/11/14, 28 y.o.   MRN: [id number]  Information Source: Information source: Patient  Current Stressors:  Patient states their primary concerns and needs for treatment are:: "I've been stressed out" Patient states their goals for this hospitilization and ongoing recovery are:: "to destress a little bit" Educational / Learning stressors: Pt currently a senior in college Employment / Job issues: unemployed Family Relationships: Pt reports strained family Software engineer / Lack of resources (include bankruptcy): unemployed Housing / Lack of housing: currently homeless living in a shelter Physical health (include injuries & life threatening diseases): none reported Substance abuse: Pt denies  Living/Environment/Situation:  Living Arrangements: Other (Comment)(Pt reports she currently is living at a shelter) Living conditions (as described by patient or guardian): Pt reports she has until 05/15/2019 to find somewhere to stay Who else lives in the home?: Pt lives at the homeless shelter currently  Family History:  Marital status: Single Does patient have children?: No  Childhood History:  By whom was/is the patient raised?: Mother Additional childhood history information: Father was present off and on, Father on cocaine Description of patient's relationship with caregiver when they were a child: good relationship with mother, emotionally distant from father Patient's description of current relationship with people who raised him/her: "not good" Does patient have siblings?: Yes Number of Siblings: 4 Description of patient's current relationship with siblings: pt reports she has 2 brothers that she has a good relationship with and 2 other siblings she does not talk to Did patient suffer any verbal/emotional/physical/sexual abuse as a child?: Yes(Pt reports PTSD from event in childhood, but does not wish to  discuss details) Did patient suffer from severe childhood neglect?: No Has patient ever been sexually abused/assaulted/raped as an adolescent or adult?: No Was the patient ever a victim of a crime or a disaster?: No Witnessed domestic violence?: No Has patient been effected by domestic violence as an adult?: No  Education:  Highest grade of school patient has completed: some college Currently a student?: Yes Name of school: [employer] How long has the patient attended?: pt is in her senior year Learning disability?: No  Employment/Work Situation:   Employment situation: Radio broadcast assistant job has been impacted by current illness: No What is the longest time patient has a held a job?: 1.5 year Where was the patient employed at that time?: [employer] Did You Receive Any Psychiatric Treatment/Services While in the [employer]?: No Are There Guns or Other Weapons in [place]?: No Are These Psychologist, educational?: (N/A)  Financial Resources:   Financial resources: Medicaid, No income Does patient have a Programmer, applications or guardian?: No  Alcohol/Substance Abuse:   What has been your use of drugs/alcohol within the last 12 months?: Pt denies If attempted suicide, did drugs/alcohol play a role in this?: No Alcohol/Substance Abuse Treatment Hx: Denies past history Has alcohol/substance abuse ever caused legal problems?: (Pt reports she has court for a traffic ticket)  Social Support System:   Patient's Community Support System: Poor Type of faith/religion: Christian How does patient's faith help to cope with current illness?: "pray"  Leisure/Recreation:   Leisure and Hobbies: "color"  Strengths/Needs:   What is the patient's perception of their strengths?: "soccer, I'm good in school" Patient states they can use these personal strengths during their treatment to contribute to their recovery: "I just needed some space from my mom" Patient states these barriers may affect/interfere  with their treatment: Pt discussed  being stressed from school Patient states these barriers may affect their return to the community: Pt reports she can return to the shelter for now but has until the 27th Other important information patient would like considered in planning for their treatment: Pt decliens referral but is open to accepting information about mental health providers  Discharge Plan:   Currently receiving community mental health services: No Patient states concerns and preferences for aftercare planning are: Pt declines referral for mental health treatment, but is open to receiving information of agencies in the area Patient states they will know when they are safe and ready for discharge when: "I feel a lot better now" Does patient have access to transportation?: No Does patient have financial barriers related to discharge medications?: No Plan for no access to transportation at discharge: Taxi voucher or bus pass Will patient be returning to same living situation after discharge?: Yes  Summary/Recommendations:   Summary and Recommendations (to be completed by the evaluator): Pt is a 28 yo female living in [place], [place] at the homeless shelter. Pt presents to the hospital seeking treatment for SI, depression, and medication stabilization. Pt has a diagnosis of MDD, severe. Pt is single, unemployed currently, currently living in a shelter, reports history of PTSD in childhood, reports poor support system and strained familial relationships. Pt is currently in her senior year at [employer]. Pt denies SI/HI/AVH currently. Pt declines referral for mental health treatment, but is agreeable to receiving information to follow up on her own. Recommendations for pt include: crisis stabilization, therapeutic milieu, encourage group attendance and participation, medication management for mood stabilization, and development for comprehensive mental wellness plan. CSW assessing for  appropriate referrals.  Charlann Lange Ulysse Siemen MSW LCSW 05/04/2019 10:20 AM

## 2019-05-04 NOTE — Consult Note (Signed)
[place] [facility] [place] NAME: Molly Perkins    MR#:  [id number]  DATE OF BIRTH:  05/09/1991  DATE OF ADMISSION:  05/04/2019  PRIMARY CARE PHYSICIAN: Patient, No Pcp Per   REQUESTING/REFERRING PHYSICIAN: Dr. Weber Cooks  CHIEF COMPLAINT:  Patient initially admitted to inpatient psychiatric unit for self inflicted laceration of her left wrist.  Medical service consulted for lupus flareup today  HISTORY OF PRESENT ILLNESS:  Molly Perkins  is a 28 y.o. female with a known history of fibromyalgia, PTSD, lupus on methotrexate and Plaquenil who was admitted to inpatient psychiatric unit on account of self-inflicted injury by cutting on her left wrist.  This morning medical service consulted for evaluation of lupus flareup .  Patient with multiple nonspecific bodily complaints including some mild cough.  Pleuritic chest pain.  Multiple joints pains.  Complains of feeling of brain fog.  Patient also reported to have had prior history of pericarditis.  Patient denies any fevers.  Sitting up in bed and having lunch at the time of my evaluation. I have requested for stat labs as outlined below.  I also discussed case with rheumatologist on-call Dr. Jefm Bryant who will see patient in consultation.  PAST MEDICAL HISTORY:   Past Medical History:  Diagnosis Date  . Arthritis   . Asthma   . 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 Depressed   . Fibromyalgia   . GERD (gastroesophageal reflux disease)   . History of stomach ulcers   . Lupus ([place])   . Opiate abuse, continuous ([place])   . PTSD (post-traumatic stress disorder)     PAST SURGICAL HISTOIRY:   Past Surgical History:  Procedure Laterality Date  . NO PAST SURGERIES    . UPPER GI ENDOSCOPY      SOCIAL HISTORY:   Social History   Tobacco Use  . Smoking status: Former Smoker    Packs/day: 0.50    Years: 3.00    Pack years: 1.50    Types: Cigarettes   Quit date: 02/16/2014    Years since quitting: 5.2  . Smokeless tobacco: Never Used  Substance Use Topics  . Alcohol use: No    Comment: denies current use    FAMILY HISTORY:   Family History  Problem Relation Age of Onset  . Fibromyalgia Mother     DRUG ALLERGIES:   Allergies  Allergen Reactions  . Bee Venom Anaphylaxis    Has epi-pen  . Corn-Containing Products     Stomach pain  . Fish-Derived Products     Stomach pain  . Soy Allergy     Stomach pain    REVIEW OF SYSTEMS:  CONSTITUTIONAL: No fever, fatigue or weakness.  EYES: No blurred or double vision.  EARS, NOSE, AND THROAT: No tinnitus or ear pain.  RESPIRATORY: Positive for cough.  No shortness of breath.  No wheezing.    CARDIOVASCULAR: Positive for pleuritic chest pain.  No shortness of breath.  No edema.  GASTROINTESTINAL: No nausea, vomiting, diarrhea or abdominal pain.  GENITOURINARY: No dysuria, hematuria.  ENDOCRINE: No polyuria, nocturia,  HEMATOLOGY: No anemia, easy bruising or bleeding SKIN: No rash or lesion. MUSCULOSKELETAL: Positive for multiple joint pains.    NEUROLOGIC: No tingling, numbness, weakness.  PSYCHIATRY: No anxiety or depression.   MEDICATIONS AT HOME:   Prior to Admission medications   Medication Sig Start Date End Date Taking? Authorizing Provider  aluminum-magnesium hydroxide-simethicone ([place]) 063-016-01 MG/5ML SUSP  Take 30 mLs by mouth 4 (four) times daily -  before meals and at bedtime. Patient not taking: Reported on 05/02/2019 07/26/17   Hayden Rasmussen, MD  colchicine 0.6 MG tablet Take 1 tablet (0.6 mg total) by mouth daily. Patient not taking: Reported on 05/02/2019 04/15/17   Tegeler, Gwenyth Allegra, MD  colchicine 0.6 MG tablet Take 1 tablet (0.6 mg total) by mouth daily. Patient not taking: Reported on 05/02/2019 04/15/17   Little, Wenda Overland, MD  dicyclomine (BENTYL) 10 MG capsule Take 10 mg by mouth 4 (four) times daily -  before meals and at bedtime.  06/13/18    [provider]  ferrous sulfate 325 (65 FE) MG tablet Take 1 tablet (325 mg total) by mouth daily. Patient not taking: Reported on 05/02/2019 09/11/15   Blanchie Dessert, MD  hydroxychloroquine (PLAQUENIL) 200 MG tablet Take 200 mg by mouth 2 (two) times daily. 11/12/15   [provider]  methotrexate (RHEUMATREX) 2.5 MG tablet Take 10 mg by mouth every Tuesday. Caution:Chemotherapy. Protect from light.     [provider]  metoCLOPramide (REGLAN) 10 MG tablet Take 1 tablet (10 mg total) by mouth every 6 (six) hours. Patient not taking: Reported on 05/02/2019 07/26/17   Fatima Blank, MD  ondansetron (ZOFRAN ODT) 4 MG disintegrating tablet Take 1 tablet (4 mg total) by mouth every 8 (eight) hours as needed for nausea or vomiting. Patient not taking: Reported on 05/02/2019 04/15/17   Little, Wenda Overland, MD  ondansetron (ZOFRAN) 4 MG tablet Take 1 tablet (4 mg total) by mouth every 8 (eight) hours as needed for nausea or vomiting. 07/26/17   Hayden Rasmussen, MD  oxyCODONE-acetaminophen (PERCOCET) 5-325 MG tablet Take 1 tablet by mouth every 4 (four) hours as needed. Patient not taking: Reported on 05/02/2019 04/15/17   Little, Wenda Overland, MD  oxyCODONE-acetaminophen (PERCOCET/ROXICET) 5-325 MG tablet Take 1 tablet by mouth every 4 (four) hours as needed for severe pain. Patient not taking: Reported on 05/02/2019 04/15/17   Tegeler, Gwenyth Allegra, MD  pantoprazole (PROTONIX) 20 MG tablet Take 20 mg by mouth daily. 05/30/17   [provider]  predniSONE (DELTASONE) 5 MG tablet Take 10 mg by mouth daily. 04/30/19   [provider]  pregabalin (LYRICA) 150 MG capsule Take 150 mg by mouth 2 (two) times daily. 04/30/19 05/30/19  [provider]  sucralfate (CARAFATE) 1 g tablet Take 1 tablet (1 g total) by mouth 4 (four) times daily. Patient not taking: Reported on 05/02/2019 08/11/16 05/01/28  Lisa Roca, MD  promethazine (PHENERGAN) 25 MG  tablet Take 0.5-1 tablets (12.5-25 mg total) by mouth every 6 (six) hours as needed. Patient not taking: Reported on 11/21/2015 03/08/15 11/21/15  Fatima Blank A, CNM      VITAL SIGNS:  Blood pressure 118/73, pulse 77, temperature 98.1 F (36.7 C), temperature source Oral, resp. rate 16, height 5' 4" (1.626 m), weight 66.2 kg, SpO2 100 %.  PHYSICAL EXAMINATION:  GENERAL:  28 y.o.-year-old patient lying in the bed with no acute distress.  EYES: Pupils equal, round, reactive to light and accommodation. No scleral icterus. Extraocular muscles intact.  HEENT: Head atraumatic, normocephalic. Oropharynx and nasopharynx clear.  NECK:  Supple, no jugular venous distention. No thyroid enlargement, no tenderness.  LUNGS: Normal breath sounds bilaterally, no wheezing, rales,rhonchi or crepitation. No use of accessory muscles of respiration.  CARDIOVASCULAR: S1, S2 normal. No murmurs, rubs, or gallops.  ABDOMEN: Soft, nontender, nondistended. Bowel sounds present. No organomegaly  or mass.  EXTREMITIES: No pedal edema, cyanosis, or clubbing.  NEUROLOGIC: Cranial nerves II through XII are intact. Muscle strength 5/5 in all extremities. Sensation intact. Gait not checked.  PSYCHIATRIC: The patient is alert and oriented x 3.  SKIN: No obvious rash, lesion, or ulcer.  LABORATORY PANEL:   CBC Recent Labs  Lab 05/02/19 2027  WBC 9.5  HGB 11.1*  HCT 36.1  PLT 489*   ------------------------------------------------------------------------------------------------------------------  Chemistries  Recent Labs  Lab 05/02/19 2027  NA 138  K 3.9  CL 103  CO2 23  GLUCOSE 91  BUN 7  CREATININE 1.08*  CALCIUM 9.7  AST 30  ALT 18  ALKPHOS 58  BILITOT 0.5   ------------------------------------------------------------------------------------------------------------------  Cardiac Enzymes No results for input(s): TROPONINI in the last 168 hours.  ------------------------------------------------------------------------------------------------------------------  RADIOLOGY:  No results found.  EKG:   Orders placed or performed during the hospital encounter of 05/04/19  . EKG 12-Lead  . EKG 12-Lead  . EKG 12-Lead  . EKG 12-Lead  . EKG 12-Lead  . EKG 12-Lead    IMPRESSION AND PLAN:  Patient is a 28 year old female with history of known history of fibromyalgia, PTSD, lupus on methotrexate and Plaquenil who was admitted to inpatient psychiatric unit on account of self-inflicted injury by cutting on her left wrist.  This morning medical service consulted for evaluation of lupus flareup  1.  Lupus flareup  Patient with multiple nonspecific bodily complaints including some mild cough.  Pleuritic chest pain.  Multiple joints pains. I requested for stat labs.  ESR.  C-reactive protein.  Complement levels; C3 and C4 Patient already on Plaquenil. Patient already on prednisone 40 mg p.o. daily. Given report of pleuritic chest pain and prior history of pericarditis, I have requested for chest x-ray, twelve-lead EKG and cardiac enzymes.  2D echocardiogram also requested to rule out any underlying pericardial effusion. Rheumatology consult placed.  I discussed the case with Dr. Jefm Bryant who will see patient in consultation. Follow-up on urine drug screen as well as pregnancy test.  2.  Major depressive disorder Patient admitted to inpatient psychiatric unit due to self-inflicted injury by cutting on her left wrist. Dressing already in place. Being managed by psychiatry service  3.  History of PTSD Stable  DVT prophylaxis; patient is ambulatory in the psych unit.  All the records are reviewed and case discussed with Consulting provider. Management plans discussed with the patient, and she is in agreement.  CODE STATUS: Full code  TOTAL TIME TAKING CARE OF THIS PATIENT: 58 minutes.    Gicela Schwarting M.D on 05/04/2019 at 2:51 PM   Between 7am to 6pm - Pager - [phone]  After 6pm go to [email or web address] - Proofreader  [facility]  Office  [phone]  CC: Primary care Physician: Patient, No Pcp Per   Note: This dictation was prepared with Dragon dictation along with smaller phrase technology. Any transcriptional errors that result from this process are unintentional.
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
## 2019-05-04 NOTE — Consult Note (Signed)
Reason for Consult: Lupus  Referring Physician: Hospitalist  [place] C Andal   HPI: 28 year old African-American female.  Diagnosed with lupus in 2017.  Discoid lesions and arthritis.  She has been on prednisone in varying doses.  Plaquenil.  Methotrexate 4 pills a week.  And benlystra injections.  History of pain syndrome.  Previously been to pain clinic. Prior history of substance abuse with marijuana  Recently ran out of prednisone and stopped taking her injections.  She was seen on 829 with draining wounds in the lower extremities.  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  Treated with Keflex and Norco.  She was seen on 1012 complaining of pain back pain shoulder pain.  Lyrica.  Some narcotic Recent labs showed negative proteinuria.  Negative anti-DNA.  Normal CBC.  Recent suicide attempt..  Admitted to psych.  White count 9500.  Hemoglobin 11.  Platelets 489,000.  MCV 74.  Alcohol level elevated.  Creatinine 1.0 urinalysis recently negative. Complains of some shortness of breath when she lies down.  Took 40 of prednisone today thought it was better.  Burning sensation across her shoulder blades.  Thinks narcotic would help that Some GI upset.  Says she has had gastritis before.  Previously on PPIs protonix.  Some chest pain.  Pending chest x-ray No prior miscarriages.  No history of blood clots.  No history of anticoagulant.   PMH: Pain syndrome.  Duodenal ulcer and stricture.  Gastric ulcer.  Reflux.  Marijuana dependence.  Neuropathic pain.  Lupus.  SURGICAL HISTORY: Endoscopy  Family History: No family history of connective tissue disease  Social History: No cigarettes.  Denies alcohol though alcohol level elevated upon admission.  Claims prior marijuana  Allergies:  Allergies  Allergen Reactions  . Bee Venom Anaphylaxis    Has epi-pen  . Corn-Containing Products     Stomach pain  . Fish-Derived Products     Stomach pain  . Soy Allergy     Stomach pain     Medications:  Scheduled: . colchicine  0.6 mg Oral Daily  . hydroxychloroquine  200 mg Oral BID  . indomethacin  25 mg Oral BID WC  . predniSONE  40 mg Oral Q breakfast  . pregabalin  150 mg Oral BID  . sertraline  25 mg Oral Daily        ROS: No raynauds.  No oral ulcers.  No sclerodactyly.  No recent photosensitive skin rash.  No recent joint swelling   PHYSICAL EXAM: Blood pressure 118/73, pulse 77, temperature 98.1 F (36.7 C), temperature source Oral, resp. rate 16, height 5\' 4"  (1.626 m), weight 66.2 kg, SpO2 100 %. Pleasant female.  Does not want the overhead light turned on because it says it makes her hurt.  Not  Anxious.  Answers questions appropriately.  1 hyperpigmented lesion over the left eyebrow.  Several discoid lesions of the hands bilaterally.  Hands with no sclerodactyly.  Sclera clear.  Oropharynx clear.  No cervical adenopathy.  Clear chest.  No murmur.  No rub.  Nontender abdomen.  No visceromegaly.  No significant edema Good range of motion neck and shoulders.  Wrists elbows MCPs PIPs hips knees ankles and toes without synovitis.  Walks without cane or walker  Assessment: History of systemic lupus.  Primarily joint pain and discoid lesions.  Recently off prednisone now back on it.  Recently off her injections for the last 3 months.  Claims to be taking her Plaquenil and methotrexate.  Atypical chest pain and  respiratory symptoms.  Suicidal attempt.  Component of chronic pain  History of esophageal and gastric ulcer disease  Recommendations: Agree with transiently boosting her prednisone and then back to her baseline of 10 mg No contraindication to her getting her methotrexate dose when due Agree with labs ordered If persistent chest pain could get echocardiogram  GIven her gastropathy would make sure she has PPIs  Trevor Iha W 05/04/2019, 3:14 PM

## 2019-05-04 NOTE — Progress Notes (Signed)
Admission Note:  26yr female who presents Voluntary commitment n some acute distress for the treatment of SI and Depression. Patient upon arrival was angry, agitated and upset she stated " how long do l have to be hereNurse, mental health replied that up to the MD. Patient appears flat and sad, she was not receptive to staff, wouldn't make eye contact. Per report patient is depressed, she was in a shelter when she decided to slit her left wrist. 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  Patient explained her reason for slitting her wrist also stress with her family and her chronic pain. Patient 's thoughts are organized and coherent, she denies SI/HI/AVH.   Patient has Past medical Hx of Asthma, Depression, Arthritis, Chronic Pericarditis, GERD, Fibromyalgia, LUPUS, Opiate Abuse, and PTSD. Marland Kitchen Patient's skin was searched in presence of Heather Watson MHT, she has deep laceration with stiches intact to left wrist , it appears warm,dry and intact no drainage, she was also searched and no contraband found, POC and unit policies explained and understanding verbalized, consents was obtained. No distress notes, 15 minutes safety checks maintained will continue to monitor .

## 2019-05-04 NOTE — Progress Notes (Signed)
Recreation Therapy Notes  INPATIENT RECREATION THERAPY ASSESSMENT  Patient Details Name: Molly Perkins MRN: [id number] DOB: 1991/01/10 Today's Date: 05/04/2019       Information Obtained From: Patient  Able to Participate in Assessment/Interview: Yes  Patient Presentation: Responsive  Reason for Admission (Per Patient): Active Symptoms  Patient Stressors: School  Coping Skills:   Art  Leisure Interests (2+):  Art - Production assistant, radio of Recreation/Participation: Arboriculturist Resources:     [employer]:     Current Use:    If no, Barriers?:    Expressed Interest in [employer] Information:    [employer] of Residence:  [place]  Patient Main Form of Transportation: Diplomatic Services operational officer  Patient Strengths:  N/A  Patient Identified Areas of Improvement:  N/A  Patient Goal for Hospitalization:  Work on my stress  Current SI (including self-harm):  No  Current HI:  No  Current AVH: No  Staff Intervention Plan: Group Attendance, Collaborate with Interdisciplinary Treatment Team  Consent to Intern Participation: N/A  Molly Perkins 05/04/2019, 3:12 PM

## 2019-05-04 NOTE — Progress Notes (Signed)
Recreation Therapy Notes  Date:05/04/2019  Time: 9:30 am  Location: Craft room  Behavioral response: Appropriate   Intervention Topic: Communication  Discussion/Intervention:  Group content today was focused on communication. The group defined communication and ways to communicate with others. Individuals stated reason why communication is important and some reasons to communicate with others. Patients expressed if they thought they were good at communicating with others and ways they could improve their communication skills. The group identified important parts of communication and some experiences they have had in the past with communication. The group participated in the intervention "Words in a Bag", where they had a chance to test out their communication skills and identify ways to improve their communication techniques.  Clinical Observations/Feedback:  Patient came to group late and did not provide any positive contributions towards group. Jaiyla Granados LRT/CTRS         Armour Villanueva 05/04/2019 11:21 AM

## 2019-05-05 ENCOUNTER — Inpatient Hospital Stay
Admit: 2019-05-05 | Discharge: 2019-05-05 | Disposition: A | Discharge status: Home / Self Care | Payer: Medicaid Other | Attending: Internal Medicine | Admitting: Internal Medicine

## 2019-05-05 LAB — ECHOCARDIOGRAM COMPLETE
Height: 64 in
Weight: 2336 oz

## 2019-05-05 LAB — BASIC METABOLIC PANEL
Anion gap: 12 (ref 5–15)
BUN: 12 mg/dL (ref 6–20)
CO2: 24 mmol/L (ref 22–32)
Calcium: 8.9 mg/dL (ref 8.9–10.3)
Chloride: 101 mmol/L (ref 98–111)
Creatinine, Ser: 1.08 mg/dL — ABNORMAL HIGH (ref 0.44–1.00)
GFR calc Af Amer: >60 mL/min (ref >60)
GFR calc non Af Amer: >60 mL/min (ref >60)
Glucose, Bld: 98 mg/dL (ref 70–99)
Potassium: 3.2 mmol/L — ABNORMAL LOW (ref 3.5–5.1)
Sodium: 137 mmol/L (ref 135–145)

## 2019-05-05 LAB — CBC
HCT: 31.2 % — ABNORMAL LOW (ref 36.0–46.0)
Hemoglobin: 9.5 g/dL — ABNORMAL LOW (ref 12.0–15.0)
MCH: 22.1 pg — ABNORMAL LOW (ref 26.0–34.0)
MCHC: 30.4 g/dL (ref 30.0–36.0)
MCV: 72.7 fL — ABNORMAL LOW (ref 80.0–100.0)
Platelets: 391 10*3/uL (ref 150–400)
RBC: 4.29 MIL/uL (ref 3.87–5.11)
RDW: 17.4 % — ABNORMAL HIGH (ref 11.5–15.5)
WBC: 7.5 10*3/uL (ref 4.0–10.5)
nRBC: 0 % (ref 0.0–0.2)

## 2019-05-05 LAB — C4 COMPLEMENT: Complement C4, Body Fluid: 23 mg/dL (ref 14–44)

## 2019-05-05 LAB — MAGNESIUM: Magnesium: 2.2 mg/dL (ref 1.7–2.4)

## 2019-05-05 LAB — C3 COMPLEMENT: C3 Complement: 155 mg/dL (ref 82–167)

## 2019-05-05 MED ORDER — TRAZODONE HCL 100 MG PO TABS
100.0000 mg | ORAL_TABLET | Freq: Every evening | ORAL | Status: DC | PRN
  Administered 2019-05-05 – 2019-05-06 (×3): 100 mg via ORAL
  Filled 2019-05-05 (×2): qty 1

## 2019-05-05 MED ORDER — POTASSIUM CHLORIDE CRYS ER 20 MEQ PO TBCR
40.0000 meq | EXTENDED_RELEASE_TABLET | Freq: Once | ORAL | Status: AC
  Administered 2019-05-05: 40 meq via ORAL
  Filled 2019-05-05: qty 2

## 2019-05-05 MED ORDER — SERTRALINE HCL 25 MG PO TABS
50.0000 mg | ORAL_TABLET | Freq: Every day | ORAL | Status: DC
  Administered 2019-05-06 – 2019-05-07 (×2): 50 mg via ORAL
  Filled 2019-05-05 (×2): qty 2

## 2019-05-05 MED ORDER — PREGABALIN 25 MG PO CAPS
200.0000 mg | ORAL_CAPSULE | Freq: Two times a day (BID) | ORAL | Status: DC
  Administered 2019-05-05 – 2019-05-07 (×4): 200 mg via ORAL
  Filled 2019-05-05 (×4): qty 8

## 2019-05-05 NOTE — Progress Notes (Signed)
Completed wound care to Pt. Left wrist laceration. Wound is healing well, no drainage noted at this time. No s/s of infection noted at wound site. Cleansed with NS, patted dry. Covered in nonadherent dressing and wrapped with gauze. Pt tolerated treatment well.

## 2019-05-05 NOTE — Plan of Care (Signed)
Denies SI/HI/AVH. Refused to complete Patient self inventory today. Denies any changes in depression and anxiety. Safety was maintained on the unit. Reports pain and was provided with PRN medications. Reports no improvement.    Problem: Education: Goal: Knowledge of [facility] Education information/materials will improve Outcome: Not Progressing Goal: Emotional status will improve Outcome: Not Progressing Goal: Mental status will improve Outcome: Not Progressing

## 2019-05-05 NOTE — BHH Group Notes (Signed)
[place] LCSW Group Therapy Note  Date/Time: 05/05/2019 @ 1:00pm  Type of Therapy/Topic:  Group Therapy:  Feelings about Diagnosis  Participation Level:  None   Mood: Pleasant   Description of Group:    This group will allow patients to explore their thoughts and feelings about diagnoses they have received. Patients will be guided to explore their level of understanding and acceptance of these diagnoses. Facilitator will encourage patients to process their thoughts and feelings about the reactions of others to their diagnosis, and will guide patients in identifying ways to discuss their diagnosis with significant others in their lives. This group will be process-oriented, with patients participating in exploration of their own experiences as well as giving and receiving support and challenge from other group members.   Therapeutic Goals: 1. Patient will demonstrate understanding of diagnosis as evidence by identifying two or more symptoms of the disorder:  2. Patient will be able to express two feelings regarding the diagnosis 3. Patient will demonstrate ability to communicate their needs through discussion and/or role plays  Summary of Patient Progress:   Patient joined group late. She colored in her coloring book and eventually fell asleep during group.      Therapeutic Modalities:   Cognitive Behavioral Therapy Brief Therapy Feelings Identification   Ardelle Anton, LCSW

## 2019-05-05 NOTE — BHH Suicide Risk Assessment (Signed)
[place] INPATIENT:  Family/Significant Other Suicide Prevention Education  Suicide Prevention Education:  Education Completed; Pt's aunt, Molly Perkins [phone]), has been identified by the patient as the family member/significant other with whom the patient will be residing, and identified as the person(s) who will aid the patient in the event of a mental health crisis (suicidal ideations/suicide attempt).  With written consent from the patient, the family member/significant other has been provided the following suicide prevention education, prior to the and/or following the discharge of the patient.  The suicide prevention education provided includes the following:  Suicide risk factors  Suicide prevention and interventions  National Suicide Hotline telephone number  [facility] assessment telephone number  [facility] Emergency Assistance Hospers and/or Residential Mobile Crisis Unit telephone number  Request made of family/significant other to:  Remove weapons (e.g., guns, rifles, knives), all items previously/currently identified as safety concern.    Remove drugs/medications (over-the-counter, prescriptions, illicit drugs), all items previously/currently identified as a safety concern.  The family member/significant other verbalizes understanding of the suicide prevention education information provided.  The family member/significant other agrees to remove the items of safety concern listed above.  CSW contacted pt's aunt, Molly Perkins. Pt's aunt stated that she has been worried about the patient due to her being homeless/staying in a shelter and her Lupus. Pt's aunt asked about the program here at [facility] and discussed some aftercare options for the patient. Pt's aunt asked about some housing resources for the patient once she gets out of the hospital since the patient has been staying in a homeless shelter. Pt's aunt stated that the patient has been having lupus flare ups  for the past several days due to stress and asked if the patient is handling her medications well. Pt's aunt asked to give the patient her phone number in case that she wants to call the aunt.   Trecia Rogers 05/05/2019, 9:31 AM

## 2019-05-05 NOTE — Tx Team (Addendum)
Interdisciplinary Treatment and Diagnostic Plan Update  05/05/2019 Time of Session: 10:45am Molly Perkins MRN: [id number]  Principal Diagnosis: MDD (major depressive disorder), severe (HCC)  Secondary Diagnoses: Principal Problem:   MDD (major depressive disorder), severe (HCC) Active Problems:   Fibromyalgia   Lupus (HCC)   Self-inflicted laceration of left wrist (HCC)   Current Medications:  Current Facility-Administered Medications  Medication Dose Route Frequency Provider Last Rate Last Dose  . acetaminophen (TYLENOL) tablet 650 mg  650 mg Oral Q6H PRN Money, Gerlene Burdock, FNP   650 mg at 05/05/19 0125  . alum & mag hydroxide-simeth (MAALOX/MYLANTA) 200-200-20 MG/5ML suspension 30 mL  30 mL Oral Q4H PRN Money, Gerlene Burdock, FNP      . colchicine tablet 0.6 mg  0.6 mg Oral Daily Clapacs, John T, MD   0.6 mg at 05/05/19 3646  . hydroxychloroquine (PLAQUENIL) tablet 200 mg  200 mg Oral BID Money, Gerlene Burdock, FNP   200 mg at 05/05/19 0837  . indomethacin (INDOCIN) capsule 25 mg  25 mg Oral BID WC Clapacs, John T, MD      . magnesium hydroxide (MILK OF MAGNESIA) suspension 30 mL  30 mL Oral Daily PRN Money, Gerlene Burdock, FNP      . predniSONE (DELTASONE) tablet 40 mg  40 mg Oral Q breakfast Money, Gerlene Burdock, FNP   40 mg at 05/05/19 0837  . pregabalin (LYRICA) capsule 150 mg  150 mg Oral BID Money, Gerlene Burdock, FNP   150 mg at 05/05/19 0837  . sertraline (ZOLOFT) tablet 25 mg  25 mg Oral Daily Clapacs, Jackquline Denmark, MD   25 mg at 05/05/19 8032  . traMADol (ULTRAM) tablet 100 mg  100 mg Oral Q6H PRN Clapacs, Jackquline Denmark, MD   100 mg at 05/05/19 1224   PTA Medications: Medications Prior to Admission  Medication Sig Dispense Refill Last Dose  . aluminum-magnesium hydroxide-simethicone (MAALOX) 200-200-20 MG/5ML SUSP Take 30 mLs by mouth 4 (four) times daily -  before meals and at bedtime. (Patient not taking: Reported on 05/02/2019) 1680 mL 0   . colchicine 0.6 MG tablet Take 1 tablet (0.6 mg total) by mouth daily.  (Patient not taking: Reported on 05/02/2019) 30 tablet 0   . colchicine 0.6 MG tablet Take 1 tablet (0.6 mg total) by mouth daily. (Patient not taking: Reported on 05/02/2019) 30 tablet 0   . dicyclomine (BENTYL) 10 MG capsule Take 10 mg by mouth 4 (four) times daily -  before meals and at bedtime.      . ferrous sulfate 325 (65 FE) MG tablet Take 1 tablet (325 mg total) by mouth daily. (Patient not taking: Reported on 05/02/2019) 30 tablet 0   . hydroxychloroquine (PLAQUENIL) 200 MG tablet Take 200 mg by mouth 2 (two) times daily.  0   . methotrexate (RHEUMATREX) 2.5 MG tablet Take 10 mg by mouth every Tuesday. Caution:Chemotherapy. Protect from light.      . metoCLOPramide (REGLAN) 10 MG tablet Take 1 tablet (10 mg total) by mouth every 6 (six) hours. (Patient not taking: Reported on 05/02/2019) 30 tablet 0   . ondansetron (ZOFRAN ODT) 4 MG disintegrating tablet Take 1 tablet (4 mg total) by mouth every 8 (eight) hours as needed for nausea or vomiting. (Patient not taking: Reported on 05/02/2019) 10 tablet 0   . ondansetron (ZOFRAN) 4 MG tablet Take 1 tablet (4 mg total) by mouth every 8 (eight) hours as needed for nausea or vomiting. 12 tablet 0   .  oxyCODONE-acetaminophen (PERCOCET) 5-325 MG tablet Take 1 tablet by mouth every 4 (four) hours as needed. (Patient not taking: Reported on 05/02/2019) 15 tablet 0   . oxyCODONE-acetaminophen (PERCOCET/ROXICET) 5-325 MG tablet Take 1 tablet by mouth every 4 (four) hours as needed for severe pain. (Patient not taking: Reported on 05/02/2019) 18 tablet 0   . pantoprazole (PROTONIX) 20 MG tablet Take 20 mg by mouth daily.     . predniSONE (DELTASONE) 5 MG tablet Take 10 mg by mouth daily.     . pregabalin (LYRICA) 150 MG capsule Take 150 mg by mouth 2 (two) times daily.     . sucralfate (CARAFATE) 1 g tablet Take 1 tablet (1 g total) by mouth 4 (four) times daily. (Patient not taking: Reported on 05/02/2019) 120 tablet 0     Patient Stressors:     Patient Strengths:    Treatment Modalities: Medication Management, Group therapy, Case management,  1 to 1 session with clinician, Psychoeducation, Recreational therapy.   Physician Treatment Plan for Primary Diagnosis: MDD (major depressive disorder), severe ([place]) Long Term Goal(s): Improvement in symptoms so as ready for discharge Improvement in symptoms so as ready for discharge   Short Term Goals: Ability to disclose and discuss suicidal ideas Ability to demonstrate self-control will improve Ability to identify and develop effective coping behaviors will improve Ability to maintain clinical measurements within normal limits will improve Compliance with prescribed medications will improve  Medication Management: Evaluate patient's response, side effects, and tolerance of medication regimen.  Therapeutic Interventions: 1 to 1 sessions, Unit Group sessions and Medication administration.  Evaluation of Outcomes: Progressing  Physician Treatment Plan for Secondary Diagnosis: Principal Problem:   MDD (major depressive disorder), severe ([place]) Active Problems:   Fibromyalgia   Lupus ([place])   Self-inflicted laceration of left wrist (HCC)  Long Term Goal(s): Improvement in symptoms so as ready for discharge Improvement in symptoms so as ready for discharge   Short Term Goals: Ability to disclose and discuss suicidal ideas Ability to demonstrate self-control will improve Ability to identify and develop effective coping behaviors will improve Ability to maintain clinical measurements within normal limits will improve Compliance with prescribed medications will improve     Medication Management: Evaluate patient's response, side effects, and tolerance of medication regimen.  Therapeutic Interventions: 1 to 1 sessions, Unit Group sessions and Medication administration.  Evaluation of Outcomes: Progressing   RN Treatment Plan for Primary Diagnosis: MDD (major depressive  disorder), severe ([place]) Long Term Goal(s): Knowledge of disease and therapeutic regimen to maintain health will improve  Short Term Goals: Ability to participate in decision making will improve, Ability to verbalize feelings will improve, Ability to disclose and discuss suicidal ideas, Ability to identify and develop effective coping behaviors will improve and Compliance with prescribed medications will improve  Medication Management: RN will administer medications as ordered by provider, will assess and evaluate patient's response and provide education to patient for prescribed medication. RN will report any adverse and/or side effects to prescribing provider.  Therapeutic Interventions: 1 on 1 counseling sessions, Psychoeducation, Medication administration, Evaluate responses to treatment, Monitor vital signs and CBGs as ordered, Perform/monitor CIWA, COWS, AIMS and Fall Risk screenings as ordered, Perform wound care treatments as ordered.  Evaluation of Outcomes: Progressing   LCSW Treatment Plan for Primary Diagnosis: MDD (major depressive disorder), severe ([place]) Long Term Goal(s): Safe transition to appropriate next level of care at discharge, Engage patient in therapeutic group addressing interpersonal concerns.  Short Term Goals: Engage patient  in aftercare planning with referrals and resources and Increase skills for wellness and recovery  Therapeutic Interventions: Assess for all discharge needs, 1 to 1 time with Social worker, Explore available resources and support systems, Assess for adequacy in community support network, Educate family and significant other(s) on suicide prevention, Complete Psychosocial Assessment, Interpersonal group therapy.  Evaluation of Outcomes: Progressing   Progress in Treatment: Attending groups: No. Participating in groups: No. Taking medication as prescribed: Yes. Toleration medication: Yes. Family/Significant other contact made: Yes, individual(s)  contacted:  pt's aunt Patient understands diagnosis: Yes. Discussing patient identified problems/goals with staff: Yes. Medical problems stabilized or resolved: Yes. Denies suicidal/homicidal ideation: Yes. Issues/concerns per patient self-inventory: No. Other:   New problem(s) identified: No, Describe:  None  New Short Term/Long Term Goal(s): Medication stabilization, elimination of SI thoughts, and development of a comprehensive mental wellness plan.   Patient Goals:  "destress a little bit"  Discharge Plan or Barriers: CSW will continue to follow up for appropriate referrals and possible discharge planning. Patient did decline aftercare but did want information on [facility]. Patient is currently living in a shelter and is in need for some shelter/housing resources. Patient does have lupus which often goes untreated due to stress.   Reason for Continuation of Hospitalization: Anxiety Depression Medical Issues Medication stabilization  Estimated Length of Stay: 3-5 days   Attendees: Patient: 05/05/2019  Physician: Dr. Landry MellowGreg Clary, MD 05/05/2019   Nursing: Lorenda HatchetSlade, RN 05/05/2019   RN Care Manager: 05/05/2019  Social Worker: Stephannie PetersJasmine Mahnoor Mathisen, LCSW 05/05/2019   Recreational Therapist:  05/05/2019   Other:  05/05/2019  Other:  05/05/2019   Other: 05/05/2019       Scribe for Treatment Team: Delphia GratesJasmine M Jalaiya Oyster, LCSW 05/05/2019 11:21 AM

## 2019-05-05 NOTE — Progress Notes (Signed)
Patient alert and oriented x 4, affect is flat and sad, she appears worried and anxious stated " How long do l have to be here" appears hypervigilant and tense, she was noted fixated on her left wrist that has some self inflicted injury, during the wound inspection it was noted to have some serosanguineous drainage no foul odor, drsg  was reinforced , and patient advised not take it off.  Patient was norted isolative not interacting with peers and she didn't go to bed early she appears restless. Patient currently denies SI/HI/AVH, compliant withj medication regimen, 15 months safety checks maintained will continue to monitor

## 2019-05-05 NOTE — Progress Notes (Signed)
*  PRELIMINARY RESULTS* Echocardiogram 2D Echocardiogram has been performed.  Molly Perkins 05/05/2019, 10:04 AM

## 2019-05-05 NOTE — Progress Notes (Addendum)
[employer] - [place] at [facility]                                                                                                                                                                                  Patient Demographics   Molly Perkins, is a 28 y.o. female, DOB - June 21, 1991, YQM:[id number]  Admit date - 05/04/2019   Admitting Physician Nelly RoutArchana Kumar, MD  Outpatient Primary MD for the patient is Patient, No Pcp Per   LOS - 1  Subjective: Patient states she is feeling better.  Pain is improved    Review of Systems:   CONSTITUTIONAL: No documented fever. No fatigue, weakness. No weight gain, no weight loss.  EYES: No blurry or double vision.  ENT: No tinnitus. No postnasal drip. No redness of the oropharynx.  RESPIRATORY: No cough, no wheeze, no hemoptysis. No dyspnea.  CARDIOVASCULAR: No chest pain. No orthopnea. No palpitations. No syncope.  GASTROINTESTINAL: No nausea, no vomiting or diarrhea. No abdominal pain. No melena or hematochezia.  GENITOURINARY: No dysuria or hematuria.  ENDOCRINE: No polyuria or nocturia. No heat or cold intolerance.  HEMATOLOGY: No anemia. No bruising. No bleeding.  INTEGUMENTARY: No rashes. No lesions.  MUSCULOSKELETAL: No arthritis. No swelling. No gout.  NEUROLOGIC: No numbness, tingling, or ataxia. No seizure-type activity.  PSYCHIATRIC: No anxiety. No insomnia. No ADD.    Vitals:   Vitals:   05/04/19 0132 05/04/19 0529  BP: 118/73   Pulse: 77   Resp: 16   Temp: 98.1 F (36.7 C)   TempSrc: Oral   SpO2: 100%   Weight:  66.2 kg  Height:  5\' 4"  (1.626 m)    Wt Readings from Last 3 Encounters:  05/04/19 66.2 kg  07/26/17 61.2 kg  04/21/17 65.8 kg    No intake or output data in the 24 hours ending 05/05/19 1434  Physical Exam:   GENERAL: Pleasant-appearing in no apparent distress.  HEAD, EYES, EARS, NOSE AND THROAT: Atraumatic, normocephalic. Extraocular muscles are intact. Pupils equal and reactive to  light. Sclerae anicteric. No conjunctival injection. No oro-pharyngeal erythema.  NECK: Supple. There is no jugular venous distention. No bruits, no lymphadenopathy, no thyromegaly.  HEART: Regular rate and rhythm,. No murmurs, no rubs, no clicks.  LUNGS: Clear to auscultation bilaterally. No rales or rhonchi. No wheezes.  ABDOMEN: Soft, flat, nontender, nondistended. Has good bowel sounds. No hepatosplenomegaly appreciated.  EXTREMITIES: No evidence of any cyanosis, clubbing, or peripheral edema.  +2 pedal and radial pulses bilaterally.  NEUROLOGIC: The patient is alert, awake, and oriented x3 with no focal motor or sensory deficits appreciated bilaterally.  SKIN: Moist and warm with no rashes appreciated.  Psych: Not anxious, depressed LN: No inguinal LN enlargement    Antibiotics   Anti-infectives (From admission, onward)   Start     Dose/Rate Route Frequency Ordered Stop   05/04/19 0800  hydroxychloroquine (PLAQUENIL) tablet 200 mg     200 mg Oral 2 times daily 05/04/19 0204        Medications   Scheduled Meds: . colchicine  0.6 mg Oral Daily  . hydroxychloroquine  200 mg Oral BID  . indomethacin  25 mg Oral BID WC  . predniSONE  40 mg Oral Q breakfast  . pregabalin  200 mg Oral BID  . [START ON 05/06/2019] sertraline  50 mg Oral Daily   Continuous Infusions: PRN Meds:.acetaminophen, alum & mag hydroxide-simeth, magnesium hydroxide, traMADol, traZODone   Data Review:   Micro Results Recent Results (from the past 240 hour(s))  SARS CORONAVIRUS 2 (TAT 6-24 HRS) Nasopharyngeal Nasopharyngeal Swab     Status: None   Collection Time: 05/03/19  8:35 AM   Specimen: Nasopharyngeal Swab  Result Value Ref Range Status   SARS Coronavirus 2 NEGATIVE NEGATIVE Final    Comment: (NOTE) SARS-CoV-2 target nucleic acids are NOT DETECTED. The SARS-CoV-2 RNA is generally detectable in upper and lower respiratory specimens during the acute phase of infection. Negative results do not  preclude SARS-CoV-2 infection, do not rule out co-infections with other pathogens, and should not be used as the sole basis for treatment or other patient management decisions. Negative results must be combined with clinical observations, patient history, and epidemiological information. The expected result is Negative. Fact Sheet for Patients: HairSlick.no Fact Sheet for Healthcare Providers: quierodirigir.com This test is not yet approved or cleared by the [place] FDA and  has been authorized for detection and/or diagnosis of SARS-CoV-2 by FDA under an Emergency Use Authorization (EUA). This EUA will remain  in effect (meaning this test can be used) for the duration of the COVID-19 declaration under Section 56 4(b)(1) of the Act, 21 U.S.C. section 360bbb-3(b)(1), unless the authorization is terminated or revoked sooner. Performed at [facility], [address], [place], [place] [address]     Radiology Reports Dg Chest 1 View  Result Date: 05/04/2019 CLINICAL DATA:  Intermittent chest pain EXAM: CHEST  1 VIEW COMPARISON:  04/21/2017 FINDINGS: The heart size and mediastinal contours are within normal limits. Both lungs are clear. The visualized skeletal structures are unremarkable. IMPRESSION: No active disease. Electronically Signed   By: Duanne Guess M.D.   On: 05/04/2019 14:49     CBC Recent Labs  Lab 05/02/19 2027 05/04/19 1447 05/05/19 0710  WBC 9.5 7.2 7.5  HGB 11.1* 11.4* 9.5*  HCT 36.1 38.1 31.2*  PLT 489* 497* 391  MCV 74.1* 72.8* 72.7*  MCH 22.8* 21.8* 22.1*  MCHC 30.7 29.9* 30.4  RDW 18.1* 18.3* 17.4*  LYMPHSABS  --  1.3  --   MONOABS  --  0.4  --   EOSABS  --  0.0  --   BASOSABS  --  0.0  --     Chemistries  Recent Labs  Lab 05/02/19 2027 05/04/19 1447 05/05/19 0710  NA 138 137 137  K 3.9 3.7 3.2*  CL 103 102 101  CO2 23 26 24   GLUCOSE 91 126* 98  BUN 7 10 12   CREATININE  1.08* 1.04* 1.08*  CALCIUM 9.7 9.6 8.9  MG  --  2.3 2.2  AST 30 22  --   ALT 18 15  --  ALKPHOS 58 60  --   BILITOT 0.5 0.4  --    ------------------------------------------------------------------------------------------------------------------ estimated creatinine clearance is 73.9 mL/min (A) (by C-G formula based on SCr of 1.08 mg/dL (H)). ------------------------------------------------------------------------------------------------------------------ No results for input(s): HGBA1C in the last 72 hours. ------------------------------------------------------------------------------------------------------------------ No results for input(s): CHOL, HDL, LDLCALC, TRIG, CHOLHDL, LDLDIRECT in the last 72 hours. ------------------------------------------------------------------------------------------------------------------ No results for input(s): TSH, T4TOTAL, T3FREE, THYROIDAB in the last 72 hours.  Invalid input(s): FREET3 ------------------------------------------------------------------------------------------------------------------ No results for input(s): VITAMINB12, FOLATE, FERRITIN, TIBC, IRON, RETICCTPCT in the last 72 hours.  Coagulation profile No results for input(s): INR, PROTIME in the last 168 hours.  No results for input(s): DDIMER in the last 72 hours.  Cardiac Enzymes No results for input(s): CKMB, TROPONINI, MYOGLOBIN in the last 168 hours.  Invalid input(s): CK ------------------------------------------------------------------------------------------------------------------ Invalid input(s): [place]   Patient is a 28 year old female with history of known history of fibromyalgia, PTSD, lupus on methotrexate and Plaquenil who was admitted to inpatient psychiatric unit on account of self-inflicted injury by cutting on her left wrist.  This morning medical service consulted for evaluation of lupus flareup  1.  Lupus flareup  Sed rate  and CRP mildly abnormal Continue prednisone   2.  Major depressive disorder Patient admitted to inpatient psychiatric unit due to self-inflicted injury by cutting on her left wrist. Dressing already in place. Being managed by psychiatry service  3.  History of PTSD Stable  4.  Hypokalemia replace potassium     Code Status Orders  (From admission, onward)         Start     Ordered   05/04/19 0205  Full code  Continuous     05/04/19 0204        Code Status History    Date Active Date Inactive Code Status Order ID Comments User Context   05/02/2019 2316 05/04/2019 0124 Full Code [id number]  Hayden Rasmussen, MD ED   08/11/2016 1620 08/12/2016 1606 Full Code [id number]  Loletha Grayer, MD ED   05/15/2015 2311 05/16/2015 1551 Full Code [id number]  Milton Ferguson, MD ED   05/02/2012 0614 05/03/2012 1657 Full Code [id number]  Carlisle Cater, PA ED   Advance Care Planning Activity           Consults   DVT Prophylaxis  Lovenox   Lab Results  Component Value Date   PLT 391 05/05/2019     Time Spent in minutes 70min  Greater than 50% of time spent in care coordination and counseling patient regarding the condition and plan of care.   Dustin Flock M.D on 05/05/2019 at 2:34 PM  Between 7am to 6pm - Pager - [phone]  After 6pm go to [email or web address] - Proofreader  [employer]   Office  [phone]

## 2019-05-05 NOTE — Progress Notes (Signed)
[facility] MD Progress Note  05/05/2019 11:47 AM Molly Perkins  MRN:  [id number] Subjective: Patient is a 28 year old female admitted on 05/04/2019 after she had cut her self on the left wrist in a suicide attempt.  Objective: Patient is seen and examined.  Patient is a 28 year old female with the above-stated past psychiatric history who is seen in follow-up.  Patient is irritable this morning.  She is asking to be discharged.  She stated she does not want to talk to anyone.  She was seen by rheumatology yesterday who reinstituted her prednisone and a little bit higher dosage.  An echocardiogram was obtained this a.m., and it was essentially normal.  Her vital signs from 10/16 are normal.  She is afebrile.  Her CIWA was 9 this morning.  Nursing notes reflect that she did not sleep last night.  Her current medications include colchicine, Plaquenil, indomethacin, prednisone, Lyrica, sertraline and tramadol.  She denied any suicidal ideation this morning.  Principal Problem: MDD (major depressive disorder), severe ([place]) Diagnosis: Principal Problem:   MDD (major depressive disorder), severe ([facility]) Active Problems:   Fibromyalgia   Lupus ([place])   Self-inflicted laceration of left wrist ([place])  Total Time spent with patient: 15 minutes  Past Psychiatric History: See admission H&P  Past Medical History:  Past Medical History:  Diagnosis Date  . Arthritis   . Asthma   . 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 Depressed   . Fibromyalgia   . GERD (gastroesophageal reflux disease)   . History of stomach ulcers   . Lupus ([place])   . Opiate abuse, continuous ([place])   . PTSD (post-traumatic stress disorder)     Past Surgical History:  Procedure Laterality Date  . NO PAST SURGERIES    . UPPER GI ENDOSCOPY     Family History:  Family History  Problem Relation Age of Onset  . Fibromyalgia Mother    Family Psychiatric  History: See admission H&P Social  History:  Social History   Substance and Sexual Activity  Alcohol Use No   Comment: denies current use     Social History   Substance and Sexual Activity  Drug Use No  . Frequency: 5.0 times per week  . Types: Marijuana   Comment: denies current use    Social History   Socioeconomic History  . Marital status: Single    Spouse name: Not on file  . Number of children: Not on file  . Years of education: Not on file  . Highest education level: Not on file  Occupational History  . Not on file  Social Needs  . Financial resource strain: Not on file  . Food insecurity    Worry: Not on file    Inability: Not on file  . Transportation needs    Medical: Not on file    Non-medical: Not on file  Tobacco Use  . Smoking status: Former Smoker    Packs/day: 0.50    Years: 3.00    Pack years: 1.50    Types: Cigarettes    Quit date: 02/16/2014    Years since quitting: 5.2  . Smokeless tobacco: Never Used  Substance and Sexual Activity  . Alcohol use: No    Comment: denies current use  . Drug use: No    Frequency: 5.0 times per week    Types: Marijuana    Comment: denies current use  . Sexual activity: Never    Birth control/protection: None  Comment: only with females  Lifestyle  . Physical activity    Days per week: Not on file    Minutes per session: Not on file  . Stress: Not on file  Relationships  . Social Herbalist on phone: Not on file    Gets together: Not on file    Attends religious service: Not on file    Active member of club or organization: Not on file    Attends meetings of clubs or organizations: Not on file    Relationship status: Not on file  Other Topics Concern  . Not on file  Social History Narrative  . Not on file   Additional Social History:                         Sleep: Poor  Appetite:  Fair  Current Medications: Current Facility-Administered Medications  Medication Dose Route Frequency Provider Last Rate Last  Dose  . acetaminophen (TYLENOL) tablet 650 mg  650 mg Oral Q6H PRN Money, Lowry Ram, FNP   650 mg at 05/05/19 0125  . alum & mag hydroxide-simeth (MAALOX/MYLANTA) 200-200-20 MG/5ML suspension 30 mL  30 mL Oral Q4H PRN Money, Lowry Ram, FNP      . colchicine tablet 0.6 mg  0.6 mg Oral Daily Clapacs, John T, MD   0.6 mg at 05/05/19 7494  . hydroxychloroquine (PLAQUENIL) tablet 200 mg  200 mg Oral BID Money, Lowry Ram, FNP   200 mg at 05/05/19 0837  . indomethacin (INDOCIN) capsule 25 mg  25 mg Oral BID WC Clapacs, John T, MD      . magnesium hydroxide (MILK OF MAGNESIA) suspension 30 mL  30 mL Oral Daily PRN Money, Lowry Ram, FNP      . potassium chloride SA (KLOR-CON) CR tablet 40 mEq  40 mEq Oral Once Dustin Flock, MD      . predniSONE (DELTASONE) tablet 40 mg  40 mg Oral Q breakfast Money, Lowry Ram, FNP   40 mg at 05/05/19 0837  . pregabalin (LYRICA) capsule 150 mg  150 mg Oral BID Money, Lowry Ram, FNP   150 mg at 05/05/19 0837  . sertraline (ZOLOFT) tablet 25 mg  25 mg Oral Daily Clapacs, Madie Reno, MD   25 mg at 05/05/19 4967  . traMADol (ULTRAM) tablet 100 mg  100 mg Oral Q6H PRN Clapacs, Madie Reno, MD   100 mg at 05/05/19 5916    Lab Results:  Results for orders placed or performed during the hospital encounter of 05/04/19 (from the past 48 hour(s))  CBC with Differential/Platelet     Status: Abnormal   Collection Time: 05/04/19  2:47 PM  Result Value Ref Range   WBC 7.2 4.0 - 10.5 K/uL   RBC 5.23 (H) 3.87 - 5.11 MIL/uL   Hemoglobin 11.4 (L) 12.0 - 15.0 g/dL   HCT 38.1 36.0 - 46.0 %   MCV 72.8 (L) 80.0 - 100.0 fL   MCH 21.8 (L) 26.0 - 34.0 pg   MCHC 29.9 (L) 30.0 - 36.0 g/dL   RDW 18.3 (H) 11.5 - 15.5 %   Platelets 497 (H) 150 - 400 K/uL   nRBC 0.0 0.0 - 0.2 %   Neutrophils Relative % 77 %   Neutro Abs 5.5 1.7 - 7.7 K/uL   Lymphocytes Relative 18 %   Lymphs Abs 1.3 0.7 - 4.0 K/uL   Monocytes Relative 5 %   Monocytes Absolute 0.4 0.1 -  1.0 K/uL   Eosinophils Relative 0 %    Eosinophils Absolute 0.0 0.0 - 0.5 K/uL   Basophils Relative 0 %   Basophils Absolute 0.0 0.0 - 0.1 K/uL   Immature Granulocytes 0 %   Abs Immature Granulocytes 0.02 0.00 - 0.07 K/uL    Comment: Performed at [facility], [address] [place], [place] [address]  Comprehensive metabolic panel     Status: Abnormal   Collection Time: 05/04/19  2:47 PM  Result Value Ref Range   Sodium 137 135 - 145 mmol/L   Potassium 3.7 3.5 - 5.1 mmol/L   Chloride 102 98 - 111 mmol/L   CO2 26 22 - 32 mmol/L   Glucose, Bld 126 (H) 70 - 99 mg/dL   BUN 10 6 - 20 mg/dL   Creatinine, Ser 1.04 (H) 0.44 - 1.00 mg/dL   Calcium 9.6 8.9 - 10.3 mg/dL   Total Protein 8.4 (H) 6.5 - 8.1 g/dL   Albumin 4.3 3.5 - 5.0 g/dL   AST 22 15 - 41 U/L   ALT 15 0 - 44 U/L   Alkaline Phosphatase 60 38 - 126 U/L   Total Bilirubin 0.4 0.3 - 1.2 mg/dL   GFR calc non Af Amer >60 >60 mL/min   GFR calc Af Amer >60 >60 mL/min   Anion gap 9 5 - 15    Comment: Performed at [facility], [address], [place], [place] [address]  Magnesium     Status: None   Collection Time: 05/04/19  2:47 PM  Result Value Ref Range   Magnesium 2.3 1.7 - 2.4 mg/dL    Comment: Performed at [facility], [place], [place] [address]  Troponin I (High Sensitivity)     Status: None   Collection Time: 05/04/19  2:47 PM  Result Value Ref Range   Troponin I (High Sensitivity) <2 <18 ng/L    Comment: (NOTE) Elevated high sensitivity troponin I (hsTnI) values and significant  changes across serial measurements may suggest ACS but many other  chronic and acute conditions are known to elevate hsTnI results.  Refer to the "Links" section for chest pain algorithms and additional  guidance. Performed at [facility], [address] [place], [place] [address]   ESR     Status: Abnormal   Collection Time: 05/04/19  2:47 PM  Result Value Ref Range   Sed Rate 24 (H) 0 - 20 mm/hr    Comment: Performed  at [facility], [address] [place], [place] [address]  C-reactive protein     Status: None   Collection Time: 05/04/19  2:47 PM  Result Value Ref Range   CRP <0.8 <1.0 mg/dL    Comment: Performed at [place] [address] [place], [place] [address]  C3 complement     Status: None   Collection Time: 05/04/19  2:47 PM  Result Value Ref Range   C3 Complement 155 82 - 167 mg/dL    Comment: (NOTE) Performed At: [facility] [place], [place] [id number] Rush Farmer MD ER:[id number]   C4 complement     Status: None   Collection Time: 05/04/19  2:47 PM  Result Value Ref Range   Complement C4, Body Fluid 23 14 - 44 mg/dL    Comment: (NOTE)               **Effective May 14, 2019 Complement C4, Serum**  reference interval will be changing to:                            Age              Female          Female                         0 - 30 days       Not Estab.    Not Estab.                   1 month - 18 years       10 - 15       10 - 34                            >18 years       67 - 38       12 - 38 Performed At: [facility] [place], [place] [id number] Rush Farmer MD [id number]   Troponin I (High Sensitivity)     Status: None   Collection Time: 05/04/19  4:31 PM  Result Value Ref Range   Troponin I (High Sensitivity) <2 <18 ng/L    Comment: (NOTE) Elevated high sensitivity troponin I (hsTnI) values and significant  changes across serial measurements may suggest ACS but many other  chronic and acute conditions are known to elevate hsTnI results.  Refer to the "Links" section for chest pain algorithms and additional  guidance. Performed at [facility], [address] [place], [place] [address]   Basic metabolic panel     Status: Abnormal   Collection Time: 05/05/19  7:10 AM  Result Value Ref Range   Sodium 137 135 - 145 mmol/L   Potassium 3.2 (L) 3.5 - 5.1 mmol/L    Chloride 101 98 - 111 mmol/L   CO2 24 22 - 32 mmol/L   Glucose, Bld 98 70 - 99 mg/dL   BUN 12 6 - 20 mg/dL   Creatinine, Ser 1.08 (H) 0.44 - 1.00 mg/dL   Calcium 8.9 8.9 - 10.3 mg/dL   GFR calc non Af Amer >60 >60 mL/min   GFR calc Af Amer >60 >60 mL/min   Anion gap 12 5 - 15    Comment: Performed at [facility], [address] [place], [place] [address]  CBC     Status: Abnormal   Collection Time: 05/05/19  7:10 AM  Result Value Ref Range   WBC 7.5 4.0 - 10.5 K/uL   RBC 4.29 3.87 - 5.11 MIL/uL   Hemoglobin 9.5 (L) 12.0 - 15.0 g/dL   HCT 31.2 (L) 36.0 - 46.0 %   MCV 72.7 (L) 80.0 - 100.0 fL   MCH 22.1 (L) 26.0 - 34.0 pg   MCHC 30.4 30.0 - 36.0 g/dL   RDW 17.4 (H) 11.5 - 15.5 %   Platelets 391 150 - 400 K/uL   nRBC 0.0 0.0 - 0.2 %    Comment: Performed at [facility], [address] [place], [place] [address]  Magnesium     Status: None   Collection Time: 05/05/19  7:10 AM  Result Value Ref Range   Magnesium 2.2 1.7 - 2.4 mg/dL    Comment: Performed at [facility], [address] [place], [place]  [address]    Blood Alcohol level:  Lab Results  Component Value Date   ETH 56 (H) 05/02/2019   ETH <5 14/23/9532    Metabolic Disorder Labs: No results found for: HGBA1C, MPG No results found for: PROLACTIN No results found for: CHOL, TRIG, HDL, CHOLHDL, VLDL, LDLCALC  Physical Findings: AIMS: Facial and Oral Movements Muscles of Facial Expression: None, normal Lips and Perioral Area: None, normal Jaw: None, normal Tongue: None, normal,Extremity Movements Upper (arms, wrists, hands, fingers): None, normal Lower (legs, knees, ankles, toes): None, normal, Trunk Movements Neck, shoulders, hips: None, normal, Overall Severity Severity of abnormal movements (highest score from questions above): None, normal Incapacitation due to abnormal movements: None, normal Patient's awareness of abnormal movements (rate only patient's report): No  Awareness, Dental Status Current problems with teeth and/or dentures?: No Does patient usually wear dentures?: No  CIWA:  CIWA-Ar Total: 6 COWS:  COWS Total Score: 4  Musculoskeletal: Strength & Muscle Tone: within normal limits Gait & Station: normal Patient leans: N/A  Psychiatric Specialty Exam: Physical Exam  Nursing note and vitals reviewed. Constitutional: She is oriented to person, place, and time. She appears well-developed and well-nourished.  HENT:  Head: Normocephalic and atraumatic.  Respiratory: Effort normal.  Neurological: She is alert and oriented to person, place, and time.    ROS  Blood pressure 118/73, pulse 77, temperature 98.1 F (36.7 C), temperature source Oral, resp. rate 16, height 5' 4"  (1.626 m), weight 66.2 kg, SpO2 100 %.Body mass index is 25.06 kg/m.  General Appearance: Casual  Eye Contact:  Minimal  Speech:  Normal Rate  Volume:  Normal  Mood:  Irritable  Affect:  Congruent  Thought Process:  Coherent and Descriptions of Associations: Circumstantial  Orientation:  Full (Time, Place, and Person)  Thought Content:  Logical  Suicidal Thoughts:  No  Homicidal Thoughts:  No  Memory:  Immediate;   Fair Recent;   Fair Remote;   Fair  Judgement:  Impaired  Insight:  Lacking  Psychomotor Activity:  Normal  Concentration:  Concentration: Fair and Attention Span: Fair  Recall:  [employer] of Knowledge:  Fair  Language:  Fair  Akathisia:  Negative  Handed:  Right  AIMS (if indicated):     Assets:  Desire for Improvement Resilience  ADL's:  Intact  Cognition:  WNL  Sleep:  Number of Hours: 0     Treatment Plan Summary: Daily contact with patient to assess and evaluate symptoms and progress in treatment, Medication management and Plan : Patient is seen and examined.  Patient is a 28 year old female with the above-stated past medical and psychiatric history who is seen in follow-up.   Diagnosis: #1 major depression, #2 systemic lupus  erythematosus, #3 chronic pain syndrome, #4 possible posttraumatic stress disorder, #5 anemia most likely related to her SLE, #6 issues with substances including alcohol and opiates  Patient is seen in follow-up.  She is significantly irritable this morning.  It may be related to some form of withdrawal.  Also the increase in restart of her prednisone may be causing some increased irritability as well.  She did have an echocardiogram this morning secondary to her continued chest pain.  It appears to be essentially normal.  She also continues on colchicine, hydroxychloroquine and indomethacin.  Because of her continued pain, irritability and lack of sleep I am going to increase her Lyrica to 200 mg p.o. twice daily.  I will also increase her Zoloft to 50 mg p.o. daily.  I will also place trazodone for sleep if she continues to have problems. 1.  Continue colchicine 0.6 mg p.o. daily secondary to complications from systemic lupus erythematosus. 2.  Continue Plaquenil 200 mg p.o. twice daily for SLE. 3.  Continue indomethacin 25 mg p.o. twice daily with food for pain and arthritis pain. 4.  Continue prednisone 40 mg p.o. daily for rheumatological issues. 5.  Increase Lyrica to 200 mg p.o. twice daily for chronic pain issues. 6.  Increase Zoloft to 50 mg p.o. daily for depression and anxiety. 7.  Continue tramadol 100 mg p.o. every 6 hours as needed pain. 8.  Add trazodone 100 mg p.o. nightly for insomnia. 9.  Disposition planning-in progress.  Sharma Covert, MD 05/05/2019, 11:47 AM

## 2019-05-06 NOTE — Progress Notes (Signed)
D: Patient has been having negative interactions with a peer. Peer and patient were separated. Patient expresses fear of this other patient. Reassurance given that he would not bother her. Denies SI, HI and AVH. Up during the night complaining of pain and difficulty breathing from pericarditis. Given extra pillows and Ultram for pain. Reassured patient that her most recent EKG was normal based on the physician's progress note. Expressing concern about her pericarditis.  A: Reassurance given. Continue to montitor R: Safety maintained.

## 2019-05-06 NOTE — Plan of Care (Signed)
  Problem: Activity: Goal: Interest or engagement in activities will improve Outcome: Progressing Goal: Sleeping patterns will improve Outcome: Progressing  D: Patient has been having negative interactions with a peer. Peer and patient were separated. Patient expresses fear of this other patient. Reassurance given that he would not bother her. Denies SI, HI and AVH. Up during the night complaining of pain and difficulty breathing from pericarditis. Given extra pillows and Ultram for pain. Reassured patient that her most recent EKG was normal based on the physician's progress note. Expressing concern about her pericarditis.  A: Reassurance given. Continue to montitor R: Safety maintained.

## 2019-05-06 NOTE — Progress Notes (Signed)
[facility] MD Progress Note  05/06/2019 11:01 AM Molly Perkins  MRN:  [id number] Subjective:  Patient is a 28 year old female admitted on 05/04/2019 after she had cut her self on the left wrist in a suicide attempt.  Objective: Patient is seen and examined.  Patient is a 28 year old female with the above-stated past psychiatric history who is seen in follow-up.  She is much less irritable this morning.  She is more concerned and at times somewhat frightened.  One of the other female patients was agitating her last evening as well as during the day.  This upset her greatly.  The female patient was placed on one-to-one.  She stated that her pain was better, but she was still having problems lying flat which induced shortness of breath.  The hospitalist saw her this morning, and basically stated she was feeling better and that her pain was improved.  As stated yesterday there was no evidence of pericardial effusion.  Her ejection fraction was estimated between 55 and 60%.  Her EKG suggested a possible pericarditis, but as stated, the echocardiogram was negative.  She is more anxious this morning.  She denied suicidal ideation.  Her vital signs are stable, she is afebrile.  She slept 7.75 hours last night.  Principal Problem: MDD (major depressive disorder), severe ([place]) Diagnosis: Principal Problem:   MDD (major depressive disorder), severe ([facility]) Active Problems:   Fibromyalgia   Lupus ([place])   Self-inflicted laceration of left wrist ([place])  Total Time spent with patient: 20 minutes  Past Psychiatric History: See admission H&P  Past Medical History:  Past Medical History:  Diagnosis Date  . Arthritis   . Asthma   . 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 Depressed   . Fibromyalgia   . GERD (gastroesophageal reflux disease)   . History of stomach ulcers   . Lupus ([place])   . Opiate abuse, continuous ([place])   . PTSD (post-traumatic stress disorder)     Past  Surgical History:  Procedure Laterality Date  . NO PAST SURGERIES    . UPPER GI ENDOSCOPY     Family History:  Family History  Problem Relation Age of Onset  . Fibromyalgia Mother    Family Psychiatric  History: See admission H&P Social History:  Social History   Substance and Sexual Activity  Alcohol Use No   Comment: denies current use     Social History   Substance and Sexual Activity  Drug Use No  . Frequency: 5.0 times per week  . Types: Marijuana   Comment: denies current use    Social History   Socioeconomic History  . Marital status: Single    Spouse name: Not on file  . Number of children: Not on file  . Years of education: Not on file  . Highest education level: Not on file  Occupational History  . Not on file  Social Needs  . Financial resource strain: Not on file  . Food insecurity    Worry: Not on file    Inability: Not on file  . Transportation needs    Medical: Not on file    Non-medical: Not on file  Tobacco Use  . Smoking status: Former Smoker    Packs/day: 0.50    Years: 3.00    Pack years: 1.50    Types: Cigarettes    Quit date: 02/16/2014    Years since quitting: 5.2  . Smokeless tobacco: Never Used  Substance and Sexual  Activity  . Alcohol use: No    Comment: denies current use  . Drug use: No    Frequency: 5.0 times per week    Types: Marijuana    Comment: denies current use  . Sexual activity: Never    Birth control/protection: None    Comment: only with females  Lifestyle  . Physical activity    Days per week: Not on file    Minutes per session: Not on file  . Stress: Not on file  Relationships  . Social Herbalist on phone: Not on file    Gets together: Not on file    Attends religious service: Not on file    Active member of club or organization: Not on file    Attends meetings of clubs or organizations: Not on file    Relationship status: Not on file  Other Topics Concern  . Not on file  Social History  Narrative  . Not on file   Additional Social History:                         Sleep: Good  Appetite:  Fair  Current Medications: Current Facility-Administered Medications  Medication Dose Route Frequency Provider Last Rate Last Dose  . acetaminophen (TYLENOL) tablet 650 mg  650 mg Oral Q6H PRN Money, Lowry Ram, FNP   650 mg at 05/05/19 1953  . alum & mag hydroxide-simeth (MAALOX/MYLANTA) 200-200-20 MG/5ML suspension 30 mL  30 mL Oral Q4H PRN Money, Darnelle Maffucci B, FNP      . colchicine tablet 0.6 mg  0.6 mg Oral Daily Clapacs, John T, MD   0.6 mg at 05/06/19 0900  . hydroxychloroquine (PLAQUENIL) tablet 200 mg  200 mg Oral BID Money, Darnelle Maffucci B, FNP   200 mg at 05/06/19 0900  . indomethacin (INDOCIN) capsule 25 mg  25 mg Oral BID WC Clapacs, John T, MD      . magnesium hydroxide (MILK OF MAGNESIA) suspension 30 mL  30 mL Oral Daily PRN Money, Lowry Ram, FNP      . predniSONE (DELTASONE) tablet 40 mg  40 mg Oral Q breakfast Money, Darnelle Maffucci B, FNP   40 mg at 05/06/19 0900  . pregabalin (LYRICA) capsule 200 mg  200 mg Oral BID Sharma Covert, MD   200 mg at 05/06/19 0900  . sertraline (ZOLOFT) tablet 50 mg  50 mg Oral Daily Sharma Covert, MD   50 mg at 05/06/19 0900  . traMADol (ULTRAM) tablet 100 mg  100 mg Oral Q6H PRN Clapacs, Madie Reno, MD   100 mg at 05/06/19 0509  . traZODone (DESYREL) tablet 100 mg  100 mg Oral QHS PRN Sharma Covert, MD   100 mg at 05/05/19 2141    Lab Results:  Results for orders placed or performed during the hospital encounter of 05/04/19 (from the past 48 hour(s))  CBC with Differential/Platelet     Status: Abnormal   Collection Time: 05/04/19  2:47 PM  Result Value Ref Range   WBC 7.2 4.0 - 10.5 K/uL   RBC 5.23 (H) 3.87 - 5.11 MIL/uL   Hemoglobin 11.4 (L) 12.0 - 15.0 g/dL   HCT 38.1 36.0 - 46.0 %   MCV 72.8 (L) 80.0 - 100.0 fL   MCH 21.8 (L) 26.0 - 34.0 pg   MCHC 29.9 (L) 30.0 - 36.0 g/dL   RDW 18.3 (H) 11.5 - 15.5 %   Platelets 497 (H) 150 -  400 K/uL   nRBC 0.0 0.0 - 0.2 %   Neutrophils Relative % 77 %   Neutro Abs 5.5 1.7 - 7.7 K/uL   Lymphocytes Relative 18 %   Lymphs Abs 1.3 0.7 - 4.0 K/uL   Monocytes Relative 5 %   Monocytes Absolute 0.4 0.1 - 1.0 K/uL   Eosinophils Relative 0 %   Eosinophils Absolute 0.0 0.0 - 0.5 K/uL   Basophils Relative 0 %   Basophils Absolute 0.0 0.0 - 0.1 K/uL   Immature Granulocytes 0 %   Abs Immature Granulocytes 0.02 0.00 - 0.07 K/uL    Comment: Performed at [facility], [address] [place], [place] [address]  Comprehensive metabolic panel     Status: Abnormal   Collection Time: 05/04/19  2:47 PM  Result Value Ref Range   Sodium 137 135 - 145 mmol/L   Potassium 3.7 3.5 - 5.1 mmol/L   Chloride 102 98 - 111 mmol/L   CO2 26 22 - 32 mmol/L   Glucose, Bld 126 (H) 70 - 99 mg/dL   BUN 10 6 - 20 mg/dL   Creatinine, Ser 1.04 (H) 0.44 - 1.00 mg/dL   Calcium 9.6 8.9 - 10.3 mg/dL   Total Protein 8.4 (H) 6.5 - 8.1 g/dL   Albumin 4.3 3.5 - 5.0 g/dL   AST 22 15 - 41 U/L   ALT 15 0 - 44 U/L   Alkaline Phosphatase 60 38 - 126 U/L   Total Bilirubin 0.4 0.3 - 1.2 mg/dL   GFR calc non Af Amer >60 >60 mL/min   GFR calc Af Amer >60 >60 mL/min   Anion gap 9 5 - 15    Comment: Performed at [facility], [address] [place], [place] [address]  Magnesium     Status: None   Collection Time: 05/04/19  2:47 PM  Result Value Ref Range   Magnesium 2.3 1.7 - 2.4 mg/dL    Comment: Performed at [facility], [place], [place] [address]  Troponin I (High Sensitivity)     Status: None   Collection Time: 05/04/19  2:47 PM  Result Value Ref Range   Troponin I (High Sensitivity) <2 <18 ng/L    Comment: (NOTE) Elevated high sensitivity troponin I (hsTnI) values and significant  changes across serial measurements may suggest ACS but many other  chronic and acute conditions are known to elevate hsTnI results.  Refer to the "Links" section for chest pain  algorithms and additional  guidance. Performed at [facility], [address] [place], [place] [address]   ESR     Status: Abnormal   Collection Time: 05/04/19  2:47 PM  Result Value Ref Range   Sed Rate 24 (H) 0 - 20 mm/hr    Comment: Performed at [facility], [address] [place], [place] [address]  C-reactive protein     Status: None   Collection Time: 05/04/19  2:47 PM  Result Value Ref Range   CRP <0.8 <1.0 mg/dL    Comment: Performed at [facility] [address], [place], [place] [address]  C3 complement     Status: None   Collection Time: 05/04/19  2:47 PM  Result Value Ref Range   C3 Complement 155 82 - 167 mg/dL    Comment: (NOTE) Performed At: [facility] [place], [place] [id number] Rush Farmer MD [id number]   C4 complement     Status: None  Collection Time: 05/04/19  2:47 PM  Result Value Ref Range   Complement C4, Body Fluid 23 14 - 44 mg/dL    Comment: (NOTE)               **Effective May 14, 2019 Complement C4, Serum**                 reference interval will be changing to:                            Age              Female          Female                         0 - 30 days       Not Estab.    Not Estab.                   1 month - 18 years       10 - 53       10 - 34                            >18 years       75 - 38       12 - 38 Performed At: [facility] [place], [place] [id number] Rush Farmer MD [id number]   Troponin I (High Sensitivity)     Status: None   Collection Time: 05/04/19  4:31 PM  Result Value Ref Range   Troponin I (High Sensitivity) <2 <18 ng/L    Comment: (NOTE) Elevated high sensitivity troponin I (hsTnI) values and significant  changes across serial measurements may suggest ACS but many other  chronic and acute conditions are known to elevate hsTnI results.  Refer to the "Links" section for chest pain algorithms and additional   guidance. Performed at [facility], [address] [place], [place] [address]   Basic metabolic panel     Status: Abnormal   Collection Time: 05/05/19  7:10 AM  Result Value Ref Range   Sodium 137 135 - 145 mmol/L   Potassium 3.2 (L) 3.5 - 5.1 mmol/L   Chloride 101 98 - 111 mmol/L   CO2 24 22 - 32 mmol/L   Glucose, Bld 98 70 - 99 mg/dL   BUN 12 6 - 20 mg/dL   Creatinine, Ser 1.08 (H) 0.44 - 1.00 mg/dL   Calcium 8.9 8.9 - 10.3 mg/dL   GFR calc non Af Amer >60 >60 mL/min   GFR calc Af Amer >60 >60 mL/min   Anion gap 12 5 - 15    Comment: Performed at [facility], [address] [place], [place] [address]  CBC     Status: Abnormal   Collection Time: 05/05/19  7:10 AM  Result Value Ref Range   WBC 7.5 4.0 - 10.5 K/uL   RBC 4.29 3.87 - 5.11 MIL/uL   Hemoglobin 9.5 (L) 12.0 - 15.0 g/dL   HCT 31.2 (L) 36.0 - 46.0 %   MCV 72.7 (L) 80.0 - 100.0 fL   MCH 22.1 (L) 26.0 - 34.0 pg   MCHC 30.4 30.0 - 36.0 g/dL   RDW 17.4 (H) 11.5 - 15.5 %   Platelets 391 150 - 400 K/uL   nRBC 0.0  0.0 - 0.2 %    Comment: Performed at [facility], [address] [place], [place] [address]  Magnesium     Status: None   Collection Time: 05/05/19  7:10 AM  Result Value Ref Range   Magnesium 2.2 1.7 - 2.4 mg/dL    Comment: Performed at [facility], [address] [place], [place] [address]    Blood Alcohol level:  Lab Results  Component Value Date   ETH 56 (H) 05/02/2019   ETH <5 62/70/3500    Metabolic Disorder Labs: No results found for: HGBA1C, MPG No results found for: PROLACTIN No results found for: CHOL, TRIG, HDL, CHOLHDL, VLDL, LDLCALC  Physical Findings: AIMS: Facial and Oral Movements Muscles of Facial Expression: None, normal Lips and Perioral Area: None, normal Jaw: None, normal Tongue: None, normal,Extremity Movements Upper (arms, wrists, hands, fingers): None, normal Lower (legs, knees, ankles, toes): None, normal, Trunk  Movements Neck, shoulders, hips: None, normal, Overall Severity Severity of abnormal movements (highest score from questions above): None, normal Incapacitation due to abnormal movements: None, normal Patient's awareness of abnormal movements (rate only patient's report): No Awareness, Dental Status Current problems with teeth and/or dentures?: No Does patient usually wear dentures?: No  CIWA:  CIWA-Ar Total: 6 COWS:  COWS Total Score: 4  Musculoskeletal: Strength & Muscle Tone: within normal limits Gait & Station: normal Patient leans: N/A  Psychiatric Specialty Exam: Physical Exam  Nursing note and vitals reviewed. Constitutional: She is oriented to person, place, and time. She appears well-developed and well-nourished.  HENT:  Head: Normocephalic and atraumatic.  Respiratory: Effort normal.  Neurological: She is alert and oriented to person, place, and time.    ROS  Blood pressure (!) 120/92, pulse 77, temperature 98.5 F (36.9 C), temperature source Oral, resp. rate 18, height 5' 4" (1.626 m), weight 66.2 kg, SpO2 100 %.Body mass index is 25.06 kg/m.  General Appearance: Casual  Eye Contact:  Fair  Speech:  Normal Rate  Volume:  Decreased  Mood:  Anxious and Depressed  Affect:  Congruent  Thought Process:  Coherent and Descriptions of Associations: Intact  Orientation:  Full (Time, Place, and Person)  Thought Content:  Logical  Suicidal Thoughts:  No  Homicidal Thoughts:  No  Memory:  Immediate;   Fair Recent;   Fair Remote;   Fair  Judgement:  Intact  Insight:  Fair  Psychomotor Activity:  Increased  Concentration:  Concentration: Fair and Attention Span: Fair  Recall:  [employer] of Knowledge:  Fair  Language:  Fair  Akathisia:  Negative  Handed:  Right  AIMS (if indicated):     Assets:  Desire for Improvement Resilience  ADL's:  Intact  Cognition:  WNL  Sleep:  Number of Hours: 7.75     Treatment Plan Summary: Daily contact with patient to assess  and evaluate symptoms and progress in treatment, Medication management and Plan : Patient is seen and examined.  Patient is a 28 year old female with the above-stated past psychiatric history who is seen in follow-up.   Diagnosis: #1 major depression, #2 systemic lupus erythematosus, #3 chronic pain syndrome, #4 possible posttraumatic stress disorder, #5 anemia most likely related to her SLE, #6 issues with substances including alcohol and opiates  Patient is seen in follow-up.  She remains anxious and depressed.  She denied suicidality.  Her Zoloft was increased yesterday to 50 mg p.o. daily.  She also has the trazodone available for sleep.  Her echocardiogram did not show  evidence of a pericardial effusion.  She does not have any evidence of heart failure.  She does have some orthopnea.  She had a chest x-ray on 10/16 that was essentially normal.  She remains in bed a great deal, and there is a possibility of pulmonary emboli.  This especially given her rheumatological diagnosis.  I will order CT scan of the chest with contrast to look for pulmonary emboli today.  Review of her radiological studies do not show any chest evaluations except for chest x-rays over the last 5 to 10 years.  A CT of the abdomen and pelvis which was performed in January 2019 caught some of her lower chest, and there was no problem at that time.  No change in her medications at least at this point. 1.  Continue colchicine 0.6 mg p.o. daily secondary to complications from systemic lupus erythematosus. 2.  Continue Plaquenil 200 mg p.o. twice daily for SLE. 3.  Continue indomethacin 25 mg p.o. twice daily with food for pain and arthritis pain. 4.  Continue prednisone 40 mg p.o. daily for rheumatological issues. 5.    Continue Lyrica 200 mg p.o. twice daily for chronic pain issues. 6.    Continue Zoloft 50 mg p.o. daily for depression and anxiety. 7.  Continue tramadol 100 mg p.o. every 6 hours as needed pain. 8.    Continue  trazodone 100 mg p.o. nightly for insomnia. 9.    CT scan of the chest with contrast today secondary to shortness of breath and orthopnea. 10.  Disposition planning-in progress.  Sharma Covert, MD 05/06/2019, 11:01 AM

## 2019-05-06 NOTE — Plan of Care (Signed)
D: Pt denies SI/HI/AV hallucinations. Pt. Has been out of her room in milieu today. She is in a pleasant mood. Patient refused her CT of her chest.  A: Pt was offered support and encouragement. Pt was given scheduled medications. Pt was encourage to attend groups. Q 15 minute checks were done for safety.  R: Pt is taking medication. Pt has no complaints.Pt receptive to treatment and safety maintained on unit.    Problem: Health Behavior/Discharge Planning: Goal: Compliance with treatment plan for underlying cause of condition will improve Outcome: Progressing Note: Patient is compliant with plan of care   Problem: Safety: Goal: Periods of time without injury will increase Outcome: Progressing Note: Patient denies SI

## 2019-05-06 NOTE — Progress Notes (Signed)
[facility] - [place] [facility]                                                                                                                                                                                  Patient Demographics   Molly Perkins, is a 28 y.o. female, DOB - 02/10/1991, ZOX:[id number]  Admit date - 05/04/2019   Admitting Physician Nelly RoutArchana Kumar, MD  Outpatient Primary MD for the patient is Patient, No Pcp Per   LOS - 2  Subjective: Pain is under control    Review of Systems:   CONSTITUTIONAL: No documented fever. No fatigue, weakness. No weight gain, no weight loss.  EYES: No blurry or double vision.  ENT: No tinnitus. No postnasal drip. No redness of the oropharynx.  RESPIRATORY: No cough, no wheeze, no hemoptysis. No dyspnea.  CARDIOVASCULAR: No chest pain. No orthopnea. No palpitations. No syncope.  GASTROINTESTINAL: No nausea, no vomiting or diarrhea. No abdominal pain. No melena or hematochezia.  GENITOURINARY: No dysuria or hematuria.  ENDOCRINE: No polyuria or nocturia. No heat or cold intolerance.  HEMATOLOGY: No anemia. No bruising. No bleeding.  INTEGUMENTARY: No rashes. No lesions.  MUSCULOSKELETAL: No arthritis. No swelling. No gout.  NEUROLOGIC: No numbness, tingling, or ataxia. No seizure-type activity.  PSYCHIATRIC: No anxiety. No insomnia. No ADD.    Vitals:   Vitals:   05/04/19 0132 05/04/19 0529 05/06/19 0624  BP: 118/73  (!) 120/92  Pulse: 77  77  Resp: 16  18  Temp: 98.1 F (36.7 C)  98.5 F (36.9 C)  TempSrc: Oral  Oral  SpO2: 100%  100%  Weight:  66.2 kg   Height:  5\' 4"  (1.626 m)     Wt Readings from Last 3 Encounters:  05/04/19 66.2 kg  07/26/17 61.2 kg  04/21/17 65.8 kg    No intake or output data in the 24 hours ending 05/06/19 1259  Physical Exam:   GENERAL: Pleasant-appearing in no apparent distress.  HEAD, EYES, EARS, NOSE AND THROAT: Atraumatic, normocephalic. Extraocular muscles are intact.  Pupils equal and reactive to light. Sclerae anicteric. No conjunctival injection. No oro-pharyngeal erythema.  NECK: Supple. There is no jugular venous distention. No bruits, no lymphadenopathy, no thyromegaly.  HEART: Regular rate and rhythm,. No murmurs, no rubs, no clicks.  LUNGS: Clear to auscultation bilaterally. No rales or rhonchi. No wheezes.  ABDOMEN: Soft, flat, nontender, nondistended. Has good bowel sounds. No hepatosplenomegaly appreciated.  EXTREMITIES: No evidence of any cyanosis, clubbing, or peripheral edema.  +2 pedal and radial pulses bilaterally.  NEUROLOGIC: The patient is alert, awake, and oriented x3 with no focal motor or sensory deficits appreciated bilaterally.  SKIN:  Moist and warm with no rashes appreciated.  Psych: Not anxious, depressed LN: No inguinal LN enlargement    Antibiotics   Anti-infectives (From admission, onward)   Start     Dose/Rate Route Frequency Ordered Stop   05/04/19 0800  hydroxychloroquine (PLAQUENIL) tablet 200 mg     200 mg Oral 2 times daily 05/04/19 0204        Medications   Scheduled Meds: . colchicine  0.6 mg Oral Daily  . hydroxychloroquine  200 mg Oral BID  . indomethacin  25 mg Oral BID WC  . predniSONE  40 mg Oral Q breakfast  . pregabalin  200 mg Oral BID  . sertraline  50 mg Oral Daily   Continuous Infusions: PRN Meds:.acetaminophen, alum & mag hydroxide-simeth, magnesium hydroxide, traMADol, traZODone   Data Review:   Micro Results Recent Results (from the past 240 hour(s))  SARS CORONAVIRUS 2 (TAT 6-24 HRS) Nasopharyngeal Nasopharyngeal Swab     Status: None   Collection Time: 05/03/19  8:35 AM   Specimen: Nasopharyngeal Swab  Result Value Ref Range Status   SARS Coronavirus 2 NEGATIVE NEGATIVE Final    Comment: (NOTE) SARS-CoV-2 target nucleic acids are NOT DETECTED. The SARS-CoV-2 RNA is generally detectable in upper and lower respiratory specimens during the acute phase of infection. Negative results do  not preclude SARS-CoV-2 infection, do not rule out co-infections with other pathogens, and should not be used as the sole basis for treatment or other patient management decisions. Negative results must be combined with clinical observations, patient history, and epidemiological information. The expected result is Negative. Fact Sheet for Patients: HairSlick.no Fact Sheet for Healthcare Providers: quierodirigir.com This test is not yet approved or cleared by the [place] FDA and  has been authorized for detection and/or diagnosis of SARS-CoV-2 by FDA under an Emergency Use Authorization (EUA). This EUA will remain  in effect (meaning this test can be used) for the duration of the COVID-19 declaration under Section 56 4(b)(1) of the Act, 21 U.S.C. section 360bbb-3(b)(1), unless the authorization is terminated or revoked sooner. Performed at [facility], [address] [place], [place] [address]     Radiology Reports Dg Chest 1 View  Result Date: 05/04/2019 CLINICAL DATA:  Intermittent chest pain EXAM: CHEST  1 VIEW COMPARISON:  04/21/2017 FINDINGS: The heart size and mediastinal contours are within normal limits. Both lungs are clear. The visualized skeletal structures are unremarkable. IMPRESSION: No active disease. Electronically Signed   By: Duanne Guess M.D.   On: 05/04/2019 14:49     CBC Recent Labs  Lab 05/02/19 2027 05/04/19 1447 05/05/19 0710  WBC 9.5 7.2 7.5  HGB 11.1* 11.4* 9.5*  HCT 36.1 38.1 31.2*  PLT 489* 497* 391  MCV 74.1* 72.8* 72.7*  MCH 22.8* 21.8* 22.1*  MCHC 30.7 29.9* 30.4  RDW 18.1* 18.3* 17.4*  LYMPHSABS  --  1.3  --   MONOABS  --  0.4  --   EOSABS  --  0.0  --   BASOSABS  --  0.0  --     Chemistries  Recent Labs  Lab 05/02/19 2027 05/04/19 1447 05/05/19 0710  NA 138 137 137  K 3.9 3.7 3.2*  CL 103 102 101  CO2 23 26 24   GLUCOSE 91 126* 98  BUN 7 10 12   CREATININE  1.08* 1.04* 1.08*  CALCIUM 9.7 9.6 8.9  MG  --  2.3 2.2  AST 30 22  --   ALT 18  15  --   ALKPHOS 58 60  --   BILITOT 0.5 0.4  --    ------------------------------------------------------------------------------------------------------------------ estimated creatinine clearance is 73.9 mL/min (A) (by C-G formula based on SCr of 1.08 mg/dL (H)). ------------------------------------------------------------------------------------------------------------------ No results for input(s): HGBA1C in the last 72 hours. ------------------------------------------------------------------------------------------------------------------ No results for input(s): CHOL, HDL, LDLCALC, TRIG, CHOLHDL, LDLDIRECT in the last 72 hours. ------------------------------------------------------------------------------------------------------------------ No results for input(s): TSH, T4TOTAL, T3FREE, THYROIDAB in the last 72 hours.  Invalid input(s): FREET3 ------------------------------------------------------------------------------------------------------------------ No results for input(s): VITAMINB12, FOLATE, FERRITIN, TIBC, IRON, RETICCTPCT in the last 72 hours.  Coagulation profile No results for input(s): INR, PROTIME in the last 168 hours.  No results for input(s): DDIMER in the last 72 hours.  Cardiac Enzymes No results for input(s): CKMB, TROPONINI, MYOGLOBIN in the last 168 hours.  Invalid input(s): CK ------------------------------------------------------------------------------------------------------------------ Invalid input(s): [place]   Patient is a 28 year old female with history of known history of fibromyalgia, PTSD, lupus on methotrexate and Plaquenil who was admitted to inpatient psychiatric unit on account of self-inflicted injury by cutting on her left wrist.  This morning medical service consulted for evaluation of lupus flareup  1.  Lupus flareup  Sed rate  and CRP mildly abnormal Continue prednisone   2.  Major depressive disorder Patient admitted to inpatient psychiatric unit due to self-inflicted injury by cutting on her left wrist. Dressing already in place. Being managed by psychiatry service  3.  History of PTSD Stable  4.  Hypokalemia recheck K+     Code Status Orders  (From admission, onward)         Start     Ordered   05/04/19 0205  Full code  Continuous     05/04/19 0204        Code Status History    Date Active Date Inactive Code Status Order ID Comments User Context   05/02/2019 2316 05/04/2019 0124 Full Code [id number]  Hayden Rasmussen, MD ED   08/11/2016 1620 08/12/2016 1606 Full Code [id number]  Loletha Grayer, MD ED   05/15/2015 2311 05/16/2015 1551 Full Code [id number]  Milton Ferguson, MD ED   05/02/2012 0614 05/03/2012 1657 Full Code [id number]  [place] Cater, PA ED   Advance Care Planning Activity           Consults   DVT Prophylaxis  Lovenox   Lab Results  Component Value Date   PLT 391 05/05/2019     Time Spent in minutes 39min  Greater than 50% of time spent in care coordination and counseling patient regarding the condition and plan of care.   Dustin Flock M.D on 05/06/2019 at 12:59 PM  Between 7am to 6pm - Pager - [phone]  After 6pm go to [email or web address] - Proofreader  [facility]   Office  [phone]

## 2019-05-06 NOTE — [facility] Group Notes (Signed)
LCSW Group Therapy Note 05/06/2019 1:15pm  Type of Therapy and Topic: Group Therapy: Feelings Around Returning Home & Establishing a Supportive Framework and Supporting Oneself When Supports Not Available  Participation Level: Active  Description of Group:  Patients first processed thoughts and feelings about upcoming discharge. These included fears of upcoming changes, lack of change, new living environments, judgements and expectations from others and overall stigma of mental health issues. The group then discussed the definition of a supportive framework, what that looks and feels like, and how do to discern it from an unhealthy non-supportive network. The group identified different types of supports as well as what to do when your family/friends are less than helpful or unavailable  Therapeutic Goals  1. Patient will identify one healthy supportive network that they can use at discharge. 2. Patient will identify one factor of a supportive framework and how to tell it from an unhealthy network. 3. Patient able to identify one coping skill to use when they do not have positive supports from others. 4. Patient will demonstrate ability to communicate their needs through discussion and/or role plays.  Summary of Patient Progress:  The patient reports she feels good. Pt engaged during group session. As patients processed their anxiety about discharge and described healthy supports patient shared she is ready to be discharge because "her head feels more clear." Patients identified at least one self-care tool they were willing to use after discharge.   Therapeutic Modalities Cognitive Behavioral Therapy Motivational Interviewing   Molly Nadel  CUEBAS-COLON, LCSW 05/06/2019 12:10 PM

## 2019-05-07 LAB — BASIC METABOLIC PANEL
Anion gap: 10 (ref 5–15)
BUN: 10 mg/dL (ref 6–20)
CO2: 28 mmol/L (ref 22–32)
Calcium: 9.6 mg/dL (ref 8.9–10.3)
Chloride: 98 mmol/L (ref 98–111)
Creatinine, Ser: 1.03 mg/dL — ABNORMAL HIGH (ref 0.44–1.00)
GFR calc Af Amer: >60 mL/min (ref >60)
GFR calc non Af Amer: >60 mL/min (ref >60)
Glucose, Bld: 91 mg/dL (ref 70–99)
Potassium: 4.3 mmol/L (ref 3.5–5.1)
Sodium: 136 mmol/L (ref 135–145)

## 2019-05-07 MED ORDER — HYDROXYCHLOROQUINE SULFATE 200 MG PO TABS: 200.0000 mg | ORAL_TABLET | Freq: Two times a day (BID) | ORAL | 1 refills | Status: AC

## 2019-05-07 MED ORDER — TRAZODONE HCL 100 MG PO TABS: 100.0000 mg | ORAL_TABLET | Freq: Every evening | ORAL | 1 refills | Status: AC | PRN

## 2019-05-07 MED ORDER — TRAMADOL HCL 50 MG PO TABS: 100.0000 mg | ORAL_TABLET | Freq: Four times a day (QID) | ORAL | 0 refills | Status: AC | PRN

## 2019-05-07 MED ORDER — COLCHICINE 0.6 MG PO TABS: 0.6000 mg | ORAL_TABLET | Freq: Every day | ORAL | 1 refills | Status: AC

## 2019-05-07 MED ORDER — PREDNISONE 10 MG PO TABS: ORAL_TABLET | ORAL | 0 refills | Status: AC

## 2019-05-07 MED ORDER — PREGABALIN 200 MG PO CAPS: 200.0000 mg | ORAL_CAPSULE | Freq: Two times a day (BID) | ORAL | 1 refills | Status: DC

## 2019-05-07 MED ORDER — INDOMETHACIN 25 MG PO CAPS: 25.0000 mg | ORAL_CAPSULE | Freq: Two times a day (BID) | ORAL | 1 refills | Status: AC

## 2019-05-07 MED ORDER — SERTRALINE HCL 50 MG PO TABS: 50.0000 mg | ORAL_TABLET | Freq: Every day | ORAL | 1 refills | Status: AC

## 2019-05-07 NOTE — Discharge Summary (Signed)
Physician Discharge Summary Note  Patient:  Molly Perkins is an 28 y.o., female MRN:  [id number] DOB:  06-Apr-1991 Patient phone:  [phone] (home)  Patient address:   [address] [place] [address],  Total Time spent with patient: 30 minutes  Date of Admission:  05/04/2019 Date of Discharge: May 07, 2019  Reason for Admission: Admitted because of self-inflicted cutting on the left forearm  Principal Problem: MDD (major depressive disorder), severe (HCC) Discharge Diagnoses: Principal Problem:   MDD (major depressive disorder), severe (HCC) Active Problems:   Fibromyalgia   Lupus (HCC)   Self-inflicted laceration of left wrist (HCC)   Past Psychiatric History: Patient has a past history of depression and some mood lability and depression related to PTSD and stress from her chronic illness  Past Medical History:  Past Medical History:  Diagnosis Date  . Arthritis   . Asthma   . 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 Depressed   . Fibromyalgia   . GERD (gastroesophageal reflux disease)   . History of stomach ulcers   . Lupus (HCC)   . Opiate abuse, continuous (HCC)   . PTSD (post-traumatic stress disorder)     Past Surgical History:  Procedure Laterality Date  . NO PAST SURGERIES    . UPPER GI ENDOSCOPY     Family History:  Family History  Problem Relation Age of Onset  . Fibromyalgia Mother    Family Psychiatric  History: See previous Social History:  Social History   Substance and Sexual Activity  Alcohol Use No   Comment: denies current use     Social History   Substance and Sexual Activity  Drug Use No  . Frequency: 5.0 times per week  . Types: Marijuana   Comment: denies current use    Social History   Socioeconomic History  . Marital status: Single    Spouse name: Not on file  . Number of children: Not on file  . Years of education: Not on file  . Highest education level:  Not on file  Occupational History  . Not on file  Social Needs  . Financial resource strain: Not on file  . Food insecurity    Worry: Not on file    Inability: Not on file  . Transportation needs    Medical: Not on file    Non-medical: Not on file  Tobacco Use  . Smoking status: Former Smoker    Packs/day: 0.50    Years: 3.00    Pack years: 1.50    Types: Cigarettes    Quit date: 02/16/2014    Years since quitting: 5.2  . Smokeless tobacco: Never Used  Substance and Sexual Activity  . Alcohol use: No    Comment: denies current use  . Drug use: No    Frequency: 5.0 times per week    Types: Marijuana    Comment: denies current use  . Sexual activity: Never    Birth control/protection: None    Comment: only with females  Lifestyle  . Physical activity    Days per week: Not on file    Minutes per session: Not on file  . Stress: Not on file  Relationships  . Social Musician on phone: Not on file    Gets together: Not on file    Attends religious service: Not on file    Active member of club or organization: Not on file  Attends meetings of clubs or organizations: Not on file    Relationship status: Not on file  Other Topics Concern  . Not on file  Social History Narrative  . Not on file    Hospital Course: Patient admitted to the psychiatric unit.  15-minute checks maintained.  Patient displayed no dangerous aggressive or violent behavior while in the hospital.  Denied any acute suicidal ideation.  Participated in discussion and treatment with both medication and therapy.  Patient had a chief concern about her medical problems.  Multiple joint and myalgic pains.  Because of her history of lupus I consulted internal medicine who I believe also consulted with rheumatology.  Patient did not require methotrexate while in the hospital and continued on prednisone at a boost dose of 40 mg.  Pain was managed with tramadol and indomethacin and colchicine.  Work-up  showed nothing that required any new further work-up for urgent concern.  At discharge I will follow recommendations of internal medicine and provide a taper of the prednisone back down towards 10 mg in a few days.  Patient will be following up with [facility] locally and with her usual medical professionals  Physical Findings: AIMS: Facial and Oral Movements Muscles of Facial Expression: None, normal Lips and Perioral Area: None, normal Jaw: None, normal Tongue: None, normal,Extremity Movements Upper (arms, wrists, hands, fingers): None, normal Lower (legs, knees, ankles, toes): None, normal, Trunk Movements Neck, shoulders, hips: None, normal, Overall Severity Severity of abnormal movements (highest score from questions above): None, normal Incapacitation due to abnormal movements: None, normal Patient's awareness of abnormal movements (rate only patient's report): No Awareness, Dental Status Current problems with teeth and/or dentures?: No Does patient usually wear dentures?: No  CIWA:  CIWA-Ar Total: 6 COWS:  COWS Total Score: 4  Musculoskeletal: Strength & Muscle Tone: within normal limits Gait & Station: normal Patient leans: N/A  Psychiatric Specialty Exam: Physical Exam  Nursing note and vitals reviewed. Constitutional: She appears well-developed and well-nourished.  HENT:  Head: Normocephalic and atraumatic.  Eyes: Pupils are equal, round, and reactive to light. Conjunctivae are normal.  Neck: Normal range of motion.  Cardiovascular: Normal heart sounds.  Respiratory: Effort normal.  GI: Soft.  Musculoskeletal: Normal range of motion.  Neurological: She is alert.  Skin: Skin is warm and dry.  Psychiatric: She has a normal mood and affect. Her behavior is normal. Judgment and thought content normal.    Review of Systems  Constitutional: Negative.   HENT: Negative.   Eyes: Negative.   Respiratory: Negative.   Cardiovascular: Negative.   Gastrointestinal: Negative.    Musculoskeletal: Negative.   Skin: Negative.   Neurological: Negative.   Psychiatric/Behavioral: Negative.     Blood pressure 117/83, pulse 68, temperature 98.6 F (37 C), temperature source Oral, resp. rate 18, height 5\' 4"  (1.626 m), weight 66.2 kg, SpO2 100 %.Body mass index is 25.06 kg/m.  General Appearance: Casual  Eye Contact:  Good  Speech:  Clear and Coherent  Volume:  Normal  Mood:  Euthymic  Affect:  Congruent  Thought Process:  Goal Directed  Orientation:  Full (Time, Place, and Person)  Thought Content:  Logical  Suicidal Thoughts:  No  Homicidal Thoughts:  No  Memory:  Immediate;   Fair Recent;   Fair Remote;   Fair  Judgement:  Fair  Insight:  Fair  Psychomotor Activity:  Normal  Concentration:  Concentration: Fair  Recall:  [employer] of Knowledge:  Fair  Language:  Fair  Akathisia:  No  Handed:  Right  AIMS (if indicated):     Assets:  Desire for Improvement Resilience Social Support  ADL's:  Intact  Cognition:  WNL  Sleep:  Number of Hours: 4.75     Have you used any form of tobacco in the last 30 days? (Cigarettes, Smokeless Tobacco, Cigars, and/or Pipes): No  Has this patient used any form of tobacco in the last 30 days? (Cigarettes, Smokeless Tobacco, Cigars, and/or Pipes) Yes, No  Blood Alcohol level:  Lab Results  Component Value Date   ETH 56 (H) 05/02/2019   ETH <5 05/15/2015    Metabolic Disorder Labs:  No results found for: HGBA1C, MPG No results found for: PROLACTIN No results found for: CHOL, TRIG, HDL, CHOLHDL, VLDL, LDLCALC  See Psychiatric Specialty Exam and Suicide Risk Assessment completed by Attending Physician prior to discharge.  Discharge destination:  Home  Is patient on multiple antipsychotic therapies at discharge:  No   Has Patient had three or more failed trials of antipsychotic monotherapy by history:  No  Recommended Plan for Multiple Antipsychotic Therapies: NA  Discharge Instructions    Diet - low  sodium heart healthy   Complete by: As directed    Increase activity slowly   Complete by: As directed      Allergies as of 05/07/2019      Reactions   Bee Venom Anaphylaxis   Has epi-pen   Corn-containing Products    Stomach pain   Fish-derived Products    Stomach pain   Soy Allergy    Stomach pain      Medication List    STOP taking these medications   aluminum-magnesium hydroxide-simethicone 200-200-20 MG/5ML Susp Commonly known as: MAALOX   dicyclomine 10 MG capsule Commonly known as: BENTYL   ferrous sulfate 325 (65 FE) MG tablet   metoCLOPramide 10 MG tablet Commonly known as: REGLAN   ondansetron 4 MG disintegrating tablet Commonly known as: Zofran ODT   ondansetron 4 MG tablet Commonly known as: ZOFRAN   oxyCODONE-acetaminophen 5-325 MG tablet Commonly known as: Percocet   pantoprazole 20 MG tablet Commonly known as: PROTONIX   sucralfate 1 g tablet Commonly known as: Carafate     TAKE these medications     Indication  colchicine 0.6 MG tablet Take 1 tablet (0.6 mg total) by mouth daily. What changed: Another medication with the same name was removed. Continue taking this medication, and follow the directions you see here.  Indication: Recurrent Pericarditis   hydroxychloroquine 200 MG tablet Commonly known as: PLAQUENIL Take 1 tablet (200 mg total) by mouth 2 (two) times daily.  Indication: Systemic Lupus Erythematosus   indomethacin 25 MG capsule Commonly known as: INDOCIN Take 1 capsule (25 mg total) by mouth 2 (two) times daily with a meal.  Indication: Pain from lupus   methotrexate 2.5 MG tablet Commonly known as: RHEUMATREX Take 10 mg by mouth every Tuesday. Caution:Chemotherapy. Protect from light.  Indication: Systemic Lupus Erythematosus   predniSONE 10 MG tablet Commonly known as: DELTASONE 4 tab daily x 3 d, 3 tab daily x 3 d, 2 tab daily x 3 d, then 1 tab daily ongoing What changed:   medication strength  how much to  take  how to take this  when to take this  additional instructions  Indication: Systemic Lupus Erythematosus   pregabalin 200 MG capsule Commonly known as: LYRICA Take 1 capsule (200 mg total) by mouth 2 (two) times daily. What changed:  medication strength  how much to take  Indication: Neuropathic Pain   sertraline 50 MG tablet Commonly known as: ZOLOFT Take 1 tablet (50 mg total) by mouth daily. Start taking on: May 08, 2019  Indication: Major Depressive Disorder   traMADol 50 MG tablet Commonly known as: ULTRAM Take 2 tablets (100 mg total) by mouth every 6 (six) hours as needed for moderate pain or severe pain.  Indication: Pain   traZODone 100 MG tablet Commonly known as: DESYREL Take 1 tablet (100 mg total) by mouth at bedtime as needed for sleep.  Indication: Trouble Sleeping      Follow-up Information    [facility] Follow up.   Why: Hours are Monday-Friday from 8:30AM-5:00PM. Contact information: [address] [place] [address] [phone]           Follow-up recommendations:  Activity:  Activity as tolerated Diet:  Regular diet Other:  Follow-up with outpatient treatment as recommended continue current medicine.  Comments: Reviewed treatment plan with patient.  She is agreeable to current medicine and understands that she will be staying in the shelter at least temporarily unless she can find some family support.  Signed: Mordecai Rasmussen, MD 05/07/2019, 12:22 PM

## 2019-05-07 NOTE — Plan of Care (Signed)
  Problem: Activity: Goal: Interest or engagement in activities will improve Outcome: Progressing Goal: Sleeping patterns will improve Outcome: Progressing  D: Patient remains quiet and isolative to self. Denies SI, HI and AVH. Appears internally preoccupied. Mood is sad. Affect is appropriate to circumstance. A: continue to monitor for safety R: Safety maintained.

## 2019-05-07 NOTE — Progress Notes (Signed)
Recreation Therapy Notes  Date:05/07/2019  Time: 9:30 am  Location: Craft room  Behavioral response: Appropriate   Intervention Topic: Relaxation  Discussion/Intervention:  Group content today was focused on relaxation. The group defined relaxation and identified healthy ways to relax. Individuals expressed how much time they spend relaxing. Patients expressed how much their life would be if they did not make time for themselves to relax. The group stated ways they could improve their relaxation techniques in the future.  Individuals participated in the intervention "Time to Relax" where they had a chance to experience different relaxation techniques.  Clinical Observations/Feedback:  Patient came to group but was pulled out by the Education officer, museum. She eventually returned explaining that she fishes, rides the bus and cleans to relax. Individual was social with peers and staff while participating in group.   Molly Perkins LRT/CTRS         Leander Tout 05/07/2019 12:09 PM

## 2019-05-07 NOTE — Progress Notes (Signed)
Recreation Therapy Notes  INPATIENT RECREATION TR PLAN  Patient Details Name: Molly Perkins MRN: [id number] DOB: 1990-07-27 Today's Date: 05/07/2019  Rec Therapy Plan Is patient appropriate for Therapeutic Recreation?: Yes Treatment times per week: At least 3 Estimated Length of Stay: 5-7 days TR Treatment/Interventions: Group participation (Comment)  Discharge Criteria Pt will be discharged from therapy if:: Discharged Treatment plan/goals/alternatives discussed and agreed upon by:: Patient/family  Discharge Summary Short term goals set: Patient will identify 3 positive coping skills strategies to use post d/c within 5 recreation therapy group sessions Short term goals met: Adequate for discharge Progress toward goals comments: Groups attended Which groups?: Communication, Other (Comment)(Relaxation) Reason goals not met: N/A Therapeutic equipment acquired: N/A Reason patient discharged from therapy: Discharge from hospital Pt/family agrees with progress & goals achieved: Yes Date patient discharged from therapy: 05/07/19   Kamin Niblack 05/07/2019, 1:42 PM

## 2019-05-07 NOTE — Progress Notes (Addendum)
[facility] - [place] at [facility]                                                                                                                                                                                  Patient Demographics   Molly Perkins, is a 28 y.o. female, DOB - September 13, 1990, GEZ:[id number]  Admit date - 05/04/2019   Admitting Physician Nelly Rout, MD  Outpatient Primary MD for the patient is Patient, No Pcp Per   LOS - 3  Subjective: Patient insist that her symptoms are due to lupus.    Review of Systems:   CONSTITUTIONAL: No documented fever. No fatigue, weakness. No weight gain, no weight loss.  EYES: No blurry or double vision.  ENT: No tinnitus. No postnasal drip. No redness of the oropharynx.  RESPIRATORY: No cough, no wheeze, no hemoptysis. No dyspnea.  CARDIOVASCULAR: No chest pain. No orthopnea. No palpitations. No syncope.  GASTROINTESTINAL: No nausea, no vomiting or diarrhea. No abdominal pain. No melena or hematochezia.  GENITOURINARY: No dysuria or hematuria.  ENDOCRINE: No polyuria or nocturia. No heat or cold intolerance.  HEMATOLOGY: No anemia. No bruising. No bleeding.  INTEGUMENTARY: No rashes. No lesions.  MUSCULOSKELETAL: No arthritis. No swelling. No gout.  NEUROLOGIC: No numbness, tingling, or ataxia. No seizure-type activity.  PSYCHIATRIC: No anxiety. No insomnia. No ADD.    Vitals:   Vitals:   05/04/19 0529 05/06/19 0624 05/06/19 1800 05/07/19 0607  BP:  (!) 120/92 128/89 117/83  Pulse:  77 60 68  Resp:  18 18 18   Temp:  98.5 F (36.9 C)  98.6 F (37 C)  TempSrc:  Oral  Oral  SpO2:  100%  100%  Weight: 66.2 kg     Height: 5\' 4"  (1.626 m)       Wt Readings from Last 3 Encounters:  05/04/19 66.2 kg  07/26/17 61.2 kg  04/21/17 65.8 kg    No intake or output data in the 24 hours ending 05/07/19 1051  Physical Exam:   GENERAL: Pleasant-appearing in no apparent distress.  HEAD, EYES, EARS, NOSE AND THROAT:  Atraumatic, normocephalic. Extraocular muscles are intact. Pupils equal and reactive to light. Sclerae anicteric. No conjunctival injection. No oro-pharyngeal erythema.  NECK: Supple. There is no jugular venous distention. No bruits, no lymphadenopathy, no thyromegaly.  HEART: Regular rate and rhythm,. No murmurs, no rubs, no clicks.  LUNGS: Clear to auscultation bilaterally. No rales or rhonchi. No wheezes.  ABDOMEN: Soft, flat, nontender, nondistended. Has good bowel sounds. No hepatosplenomegaly appreciated.  EXTREMITIES: No evidence of any cyanosis, clubbing, or peripheral edema.  +2 pedal and radial pulses bilaterally.  NEUROLOGIC: The patient is alert,  awake, and oriented x3 with no focal motor or sensory deficits appreciated bilaterally.  SKIN: Moist and warm with no rashes appreciated.  Psych: Not anxious, depressed LN: No inguinal LN enlargement    Antibiotics   Anti-infectives (From admission, onward)   Start     Dose/Rate Route Frequency Ordered Stop   05/04/19 0800  hydroxychloroquine (PLAQUENIL) tablet 200 mg     200 mg Oral 2 times daily 05/04/19 0204        Medications   Scheduled Meds: . colchicine  0.6 mg Oral Daily  . hydroxychloroquine  200 mg Oral BID  . indomethacin  25 mg Oral BID WC  . predniSONE  40 mg Oral Q breakfast  . pregabalin  200 mg Oral BID  . sertraline  50 mg Oral Daily   Continuous Infusions: PRN Meds:.acetaminophen, alum & mag hydroxide-simeth, magnesium hydroxide, traMADol, traZODone   Data Review:   Micro Results Recent Results (from the past 240 hour(s))  SARS CORONAVIRUS 2 (TAT 6-24 HRS) Nasopharyngeal Nasopharyngeal Swab     Status: None   Collection Time: 05/03/19  8:35 AM   Specimen: Nasopharyngeal Swab  Result Value Ref Range Status   SARS Coronavirus 2 NEGATIVE NEGATIVE Final    Comment: (NOTE) SARS-CoV-2 target nucleic acids are NOT DETECTED. The SARS-CoV-2 RNA is generally detectable in upper and lower respiratory  specimens during the acute phase of infection. Negative results do not preclude SARS-CoV-2 infection, do not rule out co-infections with other pathogens, and should not be used as the sole basis for treatment or other patient management decisions. Negative results must be combined with clinical observations, patient history, and epidemiological information. The expected result is Negative. Fact Sheet for Patients: SugarRoll.be Fact Sheet for Healthcare Providers: https://www.woods-mathews.com/ This test is not yet approved or cleared by the [place] FDA and  has been authorized for detection and/or diagnosis of SARS-CoV-2 by FDA under an Emergency Use Authorization (EUA). This EUA will remain  in effect (meaning this test can be used) for the duration of the COVID-19 declaration under Section 56 4(b)(1) of the Act, 21 U.S.C. section 360bbb-3(b)(1), unless the authorization is terminated or revoked sooner. Performed at [facility], [place] [address], [place], [place] [address]     Radiology Reports Dg Chest 1 View  Result Date: 05/04/2019 CLINICAL DATA:  Intermittent chest pain EXAM: CHEST  1 VIEW COMPARISON:  04/21/2017 FINDINGS: The heart size and mediastinal contours are within normal limits. Both lungs are clear. The visualized skeletal structures are unremarkable. IMPRESSION: No active disease. Electronically Signed   By: Davina Poke M.D.   On: 05/04/2019 14:49     CBC Recent Labs  Lab 05/02/19 2027 05/04/19 1447 05/05/19 0710  WBC 9.5 7.2 7.5  HGB 11.1* 11.4* 9.5*  HCT 36.1 38.1 31.2*  PLT 489* 497* 391  MCV 74.1* 72.8* 72.7*  MCH 22.8* 21.8* 22.1*  MCHC 30.7 29.9* 30.4  RDW 18.1* 18.3* 17.4*  LYMPHSABS  --  1.3  --   MONOABS  --  0.4  --   EOSABS  --  0.0  --   BASOSABS  --  0.0  --     Chemistries  Recent Labs  Lab 05/02/19 2027 05/04/19 1447 05/05/19 0710 05/07/19 0624  NA 138 137 137 136  K 3.9  3.7 3.2* 4.3  CL 103 102 101 98  CO2 23 26 24 28   GLUCOSE 91 126* 98 91  BUN 7 10 12 10   CREATININE 1.08* 1.04*  1.08* 1.03*  CALCIUM 9.7 9.6 8.9 9.6  MG  --  2.3 2.2  --   AST 30 22  --   --   ALT 18 15  --   --   ALKPHOS 58 60  --   --   BILITOT 0.5 0.4  --   --    ------------------------------------------------------------------------------------------------------------------ estimated creatinine clearance is 77.5 mL/min (A) (by C-G formula based on SCr of 1.03 mg/dL (H)). ------------------------------------------------------------------------------------------------------------------ No results for input(s): HGBA1C in the last 72 hours. ------------------------------------------------------------------------------------------------------------------ No results for input(s): CHOL, HDL, LDLCALC, TRIG, CHOLHDL, LDLDIRECT in the last 72 hours. ------------------------------------------------------------------------------------------------------------------ No results for input(s): TSH, T4TOTAL, T3FREE, THYROIDAB in the last 72 hours.  Invalid input(s): FREET3 ------------------------------------------------------------------------------------------------------------------ No results for input(s): VITAMINB12, FOLATE, FERRITIN, TIBC, IRON, RETICCTPCT in the last 72 hours.  Coagulation profile No results for input(s): INR, PROTIME in the last 168 hours.  No results for input(s): DDIMER in the last 72 hours.  Cardiac Enzymes No results for input(s): CKMB, TROPONINI, MYOGLOBIN in the last 168 hours.  Invalid input(s): CK ------------------------------------------------------------------------------------------------------------------ Invalid input(s): POCBNP    Assessment & Plan   Patient is a 28 year old female with history of known history of fibromyalgia, PTSD, lupus on methotrexate and Plaquenil who was admitted to inpatient psychiatric unit on account of self-inflicted  injury by cutting on her left wrist.  This morning medical service consulted for evaluation of lupus flareup  1.   Possible Lupus flareup  Sed rate and CRP mildly abnormal Continue prednisone Patient should be tapered off her prednisone from high-dose to her chronic dosage of 10 mg. We recommend following taper prednisone 40 mg x 3 days, prednisone 30 mg x 3 days, prednisone 20 mg x 3 days then normal dose of prednisone 10 mg daily  Psychiatry has ordered a CT of the chest  2.  Major depressive disorder Patient admitted to inpatient psychiatric unit due to self-inflicted injury by cutting on her left wrist. Dressing already in place. Being managed by psychiatry service  3.  History of PTSD Stable  4.  Hypokalemia has been replaced   Call if any questions no further recommendations at this point     Code Status Orders  (From admission, onward)         Start     Ordered   05/04/19 0205  Full code  Continuous     05/04/19 0204        Code Status History    Date Active Date Inactive Code Status Order ID Comments User Context   05/02/2019 2316 05/04/2019 0124 Full Code [id number]  Terrilee FilesButler, Michael C, MD ED   08/11/2016 1620 08/12/2016 1606 Full Code [id number]  Alford HighlandWieting, Richard, MD ED   05/15/2015 2311 05/16/2015 1551 Full Code [id number]  Bethann BerkshireZammit, Joseph, MD ED   05/02/2012 0614 05/03/2012 1657 Full Code [id number]  Renne CriglerGeiple, Joshua, PA ED   Advance Care Planning Activity           Consults   DVT Prophylaxis  Lovenox   Lab Results  Component Value Date   PLT 391 05/05/2019     Time Spent in minutes 35min  Greater than 50% of time spent in care coordination and counseling patient regarding the condition and plan of care.   Auburn BilberryShreyang Ridwan Bondy M.D on 05/07/2019 at 10:51 AM  Between 7am to 6pm - Pager - [phone]  After 6pm go to [email or web address] - Social research officer, governmentpassword [email or web address]  [facility]   Office  [phone]

## 2019-05-07 NOTE — Progress Notes (Signed)
Pt declined referral for follow up reporting that she is unsure exactly where she will be located and she has a lot on her plate with school. Pt was agreeable to receiving information regarding outpatient providers. CSW provided pt with a list of outpatient providers in the [place] area.  Evalina Field, MSW, LCSW Clinical Social Work 05/07/2019 9:51 AM

## 2019-05-07 NOTE — BHH Group Notes (Signed)
LCSW Group Therapy Note   05/07/2019 1:00 PM  Type of Therapy and Topic:  Group Therapy:  Overcoming Obstacles   Participation Level:  Did Not Attend   Description of Group:    In this group patients will be encouraged to explore what they see as obstacles to their own wellness and recovery. They will be guided to discuss their thoughts, feelings, and behaviors related to these obstacles. The group will process together ways to cope with barriers, with attention given to specific choices patients can make. Each patient will be challenged to identify changes they are motivated to make in order to overcome their obstacles. This group will be process-oriented, with patients participating in exploration of their own experiences as well as giving and receiving support and challenge from other group members.   Therapeutic Goals: 1. Patient will identify personal and current obstacles as they relate to admission. 2. Patient will identify barriers that currently interfere with their wellness or overcoming obstacles.  3. Patient will identify feelings, thought process and behaviors related to these barriers. 4. Patient will identify two changes they are willing to make to overcome these obstacles:      Summary of Patient Progress   X    Therapeutic Modalities:   Cognitive Behavioral Therapy Solution Focused Therapy Motivational Interviewing Relapse Prevention Therapy  Shraddha Lebron, MSW, LCSW 05/07/2019 10:01 AM    

## 2019-05-07 NOTE — Progress Notes (Signed)
D: Patient remains quiet and isolative to self. Denies SI, HI and AVH. Appears internally preoccupied. Mood is sad. Affect is appropriate to circumstance. A: continue to monitor for safety R: Safety maintained.

## 2019-05-07 NOTE — Progress Notes (Signed)
Pt refused Ct scan... notified [place]. Collier Bullock RN

## 2019-05-07 NOTE — Progress Notes (Signed)
Pt rates depression 2/10 and denies anxiety, SI, HI and AVH. Pt was educated on care plan and verbalizes understanding. Collier Bullock RN

## 2019-05-07 NOTE — Plan of Care (Signed)
Problem: Education: Goal: Knowledge of [facility] Education information/materials will improve 05/07/2019 1212 by Kieth Brightly, RN Outcome: Adequate for Discharge 05/07/2019 1105 by Kieth Brightly, RN Outcome: Progressing Goal: Emotional status will improve 05/07/2019 1212 by Kieth Brightly, RN Outcome: Adequate for Discharge 05/07/2019 1105 by Kieth Brightly, RN Outcome: Progressing Goal: Mental status will improve 05/07/2019 1212 by Kieth Brightly, RN Outcome: Adequate for Discharge 05/07/2019 1105 by Kieth Brightly, RN Outcome: Progressing Goal: Verbalization of understanding the information provided will improve 05/07/2019 1212 by Kieth Brightly, RN Outcome: Adequate for Discharge 05/07/2019 1105 by Kieth Brightly, RN Outcome: Progressing   Problem: Coping: Goal: Ability to verbalize frustrations and anger appropriately will improve 05/07/2019 1212 by Kieth Brightly, RN Outcome: Adequate for Discharge 05/07/2019 1105 by Kieth Brightly, RN Outcome: Progressing Goal: Ability to demonstrate self-control will improve 05/07/2019 1212 by Kieth Brightly, RN Outcome: Adequate for Discharge 05/07/2019 1105 by Kieth Brightly, RN Outcome: Progressing   Problem: Health Behavior/Discharge Planning: Goal: Identification of resources available to assist in meeting health care needs will improve 05/07/2019 1212 by Kieth Brightly, RN Outcome: Adequate for Discharge 05/07/2019 1105 by Kieth Brightly, RN Outcome: Progressing Goal: Compliance with treatment plan for underlying cause of condition will improve 05/07/2019 1212 by Kieth Brightly, RN Outcome: Adequate for Discharge 05/07/2019 1105 by Kieth Brightly, RN Outcome: Progressing   Problem: Physical Regulation: Goal: Ability to maintain clinical measurements within normal limits will improve 05/07/2019 1212 by Kieth Brightly, RN Outcome: Adequate for Discharge 05/07/2019 1105 by Kieth Brightly,  RN Outcome: Progressing   Problem: Safety: Goal: Periods of time without injury will increase 05/07/2019 1212 by Kieth Brightly, RN Outcome: Adequate for Discharge 05/07/2019 1105 by Kieth Brightly, RN Outcome: Progressing   Problem: Education: Goal: Ability to make informed decisions regarding treatment will improve 05/07/2019 1212 by Kieth Brightly, RN Outcome: Adequate for Discharge 05/07/2019 1105 by Kieth Brightly, RN Outcome: Progressing   Problem: Coping: Goal: Coping ability will improve 05/07/2019 1212 by Kieth Brightly, RN Outcome: Adequate for Discharge 05/07/2019 1105 by Kieth Brightly, RN Outcome: Progressing   Problem: Health Behavior/Discharge Planning: Goal: Identification of resources available to assist in meeting health care needs will improve 05/07/2019 1212 by Kieth Brightly, RN Outcome: Adequate for Discharge 05/07/2019 1105 by Kieth Brightly, RN Outcome: Progressing   Problem: Medication: Goal: Compliance with prescribed medication regimen will improve 05/07/2019 1212 by Kieth Brightly, RN Outcome: Adequate for Discharge 05/07/2019 1105 by Kieth Brightly, RN Outcome: Progressing   Problem: Self-Concept: Goal: Ability to disclose and discuss suicidal ideas will improve 05/07/2019 1212 by Kieth Brightly, RN Outcome: Adequate for Discharge 05/07/2019 1105 by Kieth Brightly, RN Outcome: Progressing Goal: Will verbalize positive feelings about self 05/07/2019 1212 by Kieth Brightly, RN Outcome: Adequate for Discharge 05/07/2019 1105 by Kieth Brightly, RN Outcome: Progressing   Problem: Education: Goal: Utilization of techniques to improve thought processes will improve 05/07/2019 1212 by Kieth Brightly, RN Outcome: Adequate for Discharge 05/07/2019 1105 by Kieth Brightly, RN Outcome: Progressing Goal: Knowledge of the prescribed therapeutic regimen will improve 05/07/2019 1212 by Kieth Brightly, RN Outcome: Adequate for  Discharge 05/07/2019 1105 by Kieth Brightly, RN Outcome: Progressing   Problem: Activity: Goal: Interest or engagement in leisure activities will improve 05/07/2019 1212 by Kieth Brightly, RN Outcome: Adequate for Discharge 05/07/2019 1105 by  Chalmers Cater, RN Outcome: Progressing Goal: Imbalance in normal sleep/wake cycle will improve 05/07/2019 1212 by Chalmers Cater, RN Outcome: Adequate for Discharge 05/07/2019 1105 by Chalmers Cater, RN Outcome: Progressing   Problem: Coping: Goal: Coping ability will improve 05/07/2019 1212 by Chalmers Cater, RN Outcome: Adequate for Discharge 05/07/2019 1105 by Chalmers Cater, RN Outcome: Progressing Goal: Will verbalize feelings 05/07/2019 1212 by Chalmers Cater, RN Outcome: Adequate for Discharge 05/07/2019 1105 by Chalmers Cater, RN Outcome: Progressing   Problem: Role Relationship: Goal: Will demonstrate positive changes in social behaviors and relationships 05/07/2019 1212 by Chalmers Cater, RN Outcome: Adequate for Discharge 05/07/2019 1105 by Chalmers Cater, RN Outcome: Progressing   Problem: Safety: Goal: Ability to disclose and discuss suicidal ideas will improve 05/07/2019 1212 by Chalmers Cater, RN Outcome: Adequate for Discharge 05/07/2019 1105 by Chalmers Cater, RN Outcome: Progressing Goal: Ability to identify and utilize support systems that promote safety will improve 05/07/2019 1212 by Chalmers Cater, RN Outcome: Adequate for Discharge 05/07/2019 1105 by Chalmers Cater, RN Outcome: Progressing

## 2019-05-07 NOTE — Progress Notes (Signed)
Pt denies SI, HI and AVH. Pt was educated on dc plan and verbalizes understanding. Pt received cell phone , belongings, transition record and prescriptions. Pt also got a may return back to school note. Pt was d/c to taxi to [place]. Collier Bullock RN

## 2019-05-07 NOTE — Plan of Care (Signed)
  Problem: Coping Skills Goal: STG - Patient will identify 3 positive coping skills strategies to use post d/c within 5 recreation therapy group sessions Description: STG - Patient will identify 3 positive coping skills strategies to use post d/c within 5 recreation therapy group sessions 05/07/2019 1342 by Ernest Haber, LRT Outcome: Adequate for Discharge 05/07/2019 1342 by Ernest Haber, LRT Outcome: Adequate for Discharge

## 2019-05-07 NOTE — Progress Notes (Signed)
  [facility] Adult Case Management Discharge Plan :  Will you be returning to the same living situation after discharge:  Yes,  pt lives in a shelter At discharge, do you have transportation home?: Yes,  provided with taxi voucher Do you have the ability to pay for your medications: Yes,  Medicaid  Release of information consent forms completed and in the chart;  Patient's signature needed at discharge.  Patient to Follow up at: Follow-up Information    [facility] Follow up.   Why: Hours are Monday-Friday from 8:30AM-5:00PM. Contact information: [place] [address] [phone]           Next level of care provider has access to [place] and Suicide Prevention discussed: Yes,  with pt and an aunt  Have you used any form of tobacco in the last 30 days? (Cigarettes, Smokeless Tobacco, Cigars, and/or Pipes): No  Has patient been referred to the Quitline?: N/A patient is not a smoker  Patient has been referred for addiction treatment: N/A  Yvette Rack, LCSW 05/07/2019, 1:15 PM

## 2019-05-07 NOTE — BHH Suicide Risk Assessment (Signed)
[facility] Discharge Suicide Risk Assessment   Principal Problem: MDD (major depressive disorder), severe ([place]) Discharge Diagnoses: Principal Problem:   MDD (major depressive disorder), severe ([place]) Active Problems:   Fibromyalgia   Lupus ([place])   Self-inflicted laceration of left wrist ([place])   Total Time spent with patient: 30 minutes  Musculoskeletal: Strength & Muscle Tone: within normal limits Gait & Station: normal Patient leans: N/A  Psychiatric Specialty Exam: Review of Systems  Constitutional: Negative.   HENT: Negative.   Eyes: Negative.   Respiratory: Negative.   Cardiovascular: Negative.   Gastrointestinal: Negative.   Musculoskeletal: Negative.   Skin: Negative.   Neurological: Negative.   Psychiatric/Behavioral: Negative.     Blood pressure 117/83, pulse 68, temperature 98.6 F (37 C), temperature source Oral, resp. rate 18, height 5\' 4"  (1.626 m), weight 66.2 kg, SpO2 100 %.Body mass index is 25.06 kg/m.  General Appearance: Casual  Eye Contact::  Good  Speech:  Normal Rate409  Volume:  Normal  Mood:  Euthymic  Affect:  Congruent  Thought Process:  Goal Directed  Orientation:  Full (Time, Place, and Person)  Thought Content:  Logical  Suicidal Thoughts:  No  Homicidal Thoughts:  No  Memory:  Immediate;   Fair Recent;   Fair Remote;   Fair  Judgement:  Fair  Insight:  Fair  Psychomotor Activity:  Normal  Concentration:  Fair  Recall:  [employer] [place]  Language: Fair  Akathisia:  No  Handed:  Right  AIMS (if indicated):     Assets:  Desire for Improvement Housing Physical Health  Sleep:  Number of Hours: 4.75  Cognition: WNL  ADL's:  Intact   Mental Status Per Nursing Assessment::   On Admission:  NA  Demographic Factors:  Low socioeconomic status and Living alone  Loss Factors: Financial problems/change in socioeconomic status  Historical Factors: NA  Risk Reduction Factors:   Religious beliefs about death and Positive  coping skills or problem solving skills  Continued Clinical Symptoms:  Depression:   Impulsivity  Cognitive Features That Contribute To Risk:  None    Suicide Risk:  Minimal: No identifiable suicidal ideation.  Patients presenting with no risk factors but with morbid ruminations; may be classified as minimal risk based on the severity of the depressive symptoms  Follow-up Information    [facility] Follow up.   Why: Hours are Monday-Friday from 8:30AM-5:00PM. Contact information: [address] [place] [address] [phone]           Plan Of Care/Follow-up recommendations:  Activity:  Activity as tolerated Diet:  Regular diet Other:  Follow-up with [facility] as well as following up with regular medical provider  Alethia Berthold, MD 05/07/2019, 11:59 AM

## 2019-05-07 NOTE — Plan of Care (Signed)
  Problem: Education: Goal: Knowledge of [facility] Education information/materials will improve Outcome: Progressing Goal: Emotional status will improve Outcome: Progressing Goal: Mental status will improve Outcome: Progressing Goal: Verbalization of understanding the information provided will improve Outcome: Progressing   Problem: Activity: Goal: Interest or engagement in activities will improve Outcome: Progressing Goal: Sleeping patterns will improve Outcome: Progressing   Problem: Coping: Goal: Ability to verbalize frustrations and anger appropriately will improve Outcome: Progressing Goal: Ability to demonstrate self-control will improve Outcome: Progressing   Problem: Health Behavior/Discharge Planning: Goal: Identification of resources available to assist in meeting health care needs will improve Outcome: Progressing Goal: Compliance with treatment plan for underlying cause of condition will improve Outcome: Progressing   Problem: Physical Regulation: Goal: Ability to maintain clinical measurements within normal limits will improve Outcome: Progressing   Problem: Safety: Goal: Periods of time without injury will increase Outcome: Progressing   Problem: Education: Goal: Ability to make informed decisions regarding treatment will improve Outcome: Progressing   Problem: Coping: Goal: Coping ability will improve Outcome: Progressing   Problem: Medication: Goal: Compliance with prescribed medication regimen will improve Outcome: Progressing   Problem: Self-Concept: Goal: Ability to disclose and discuss suicidal ideas will improve Outcome: Progressing Goal: Will verbalize positive feelings about self Outcome: Progressing   Problem: Education: Goal: Utilization of techniques to improve thought processes will improve Outcome: Progressing Goal: Knowledge of the prescribed therapeutic regimen will improve Outcome: Progressing   Problem:  Activity: Goal: Interest or engagement in leisure activities will improve Outcome: Progressing Goal: Imbalance in normal sleep/wake cycle will improve Outcome: Progressing   Problem: Coping: Goal: Coping ability will improve Outcome: Progressing Goal: Will verbalize feelings Outcome: Progressing   Problem: Role Relationship: Goal: Will demonstrate positive changes in social behaviors and relationships Outcome: Progressing   Problem: Safety: Goal: Ability to disclose and discuss suicidal ideas will improve Outcome: Progressing Goal: Ability to identify and utilize support systems that promote safety will improve Outcome: Progressing

## 2019-05-29 ENCOUNTER — Ambulatory Visit: Payer: Medicaid Other | Admitting: Family Medicine

## 2019-06-01 ENCOUNTER — Ambulatory Visit: Payer: Medicaid Other | Admitting: Family Medicine

## 2019-06-30 ENCOUNTER — Other Ambulatory Visit: Payer: Self-pay | Admitting: Psychiatry

## 2019-09-03 ENCOUNTER — Telehealth: Payer: Self-pay

## 2019-09-03 ENCOUNTER — Other Ambulatory Visit: Payer: Self-pay | Admitting: Psychiatry

## 2019-09-18 ENCOUNTER — Other Ambulatory Visit: Payer: Self-pay | Admitting: Psychiatry

## 2020-05-03 IMAGING — DX DG CHEST 1V
1 series · 1 of 1 positions shown · non-contrast
Comparison: 04/21/2017

CLINICAL DATA: Intermittent chest pain

EXAM:
CHEST  1 VIEW

[chest ap]
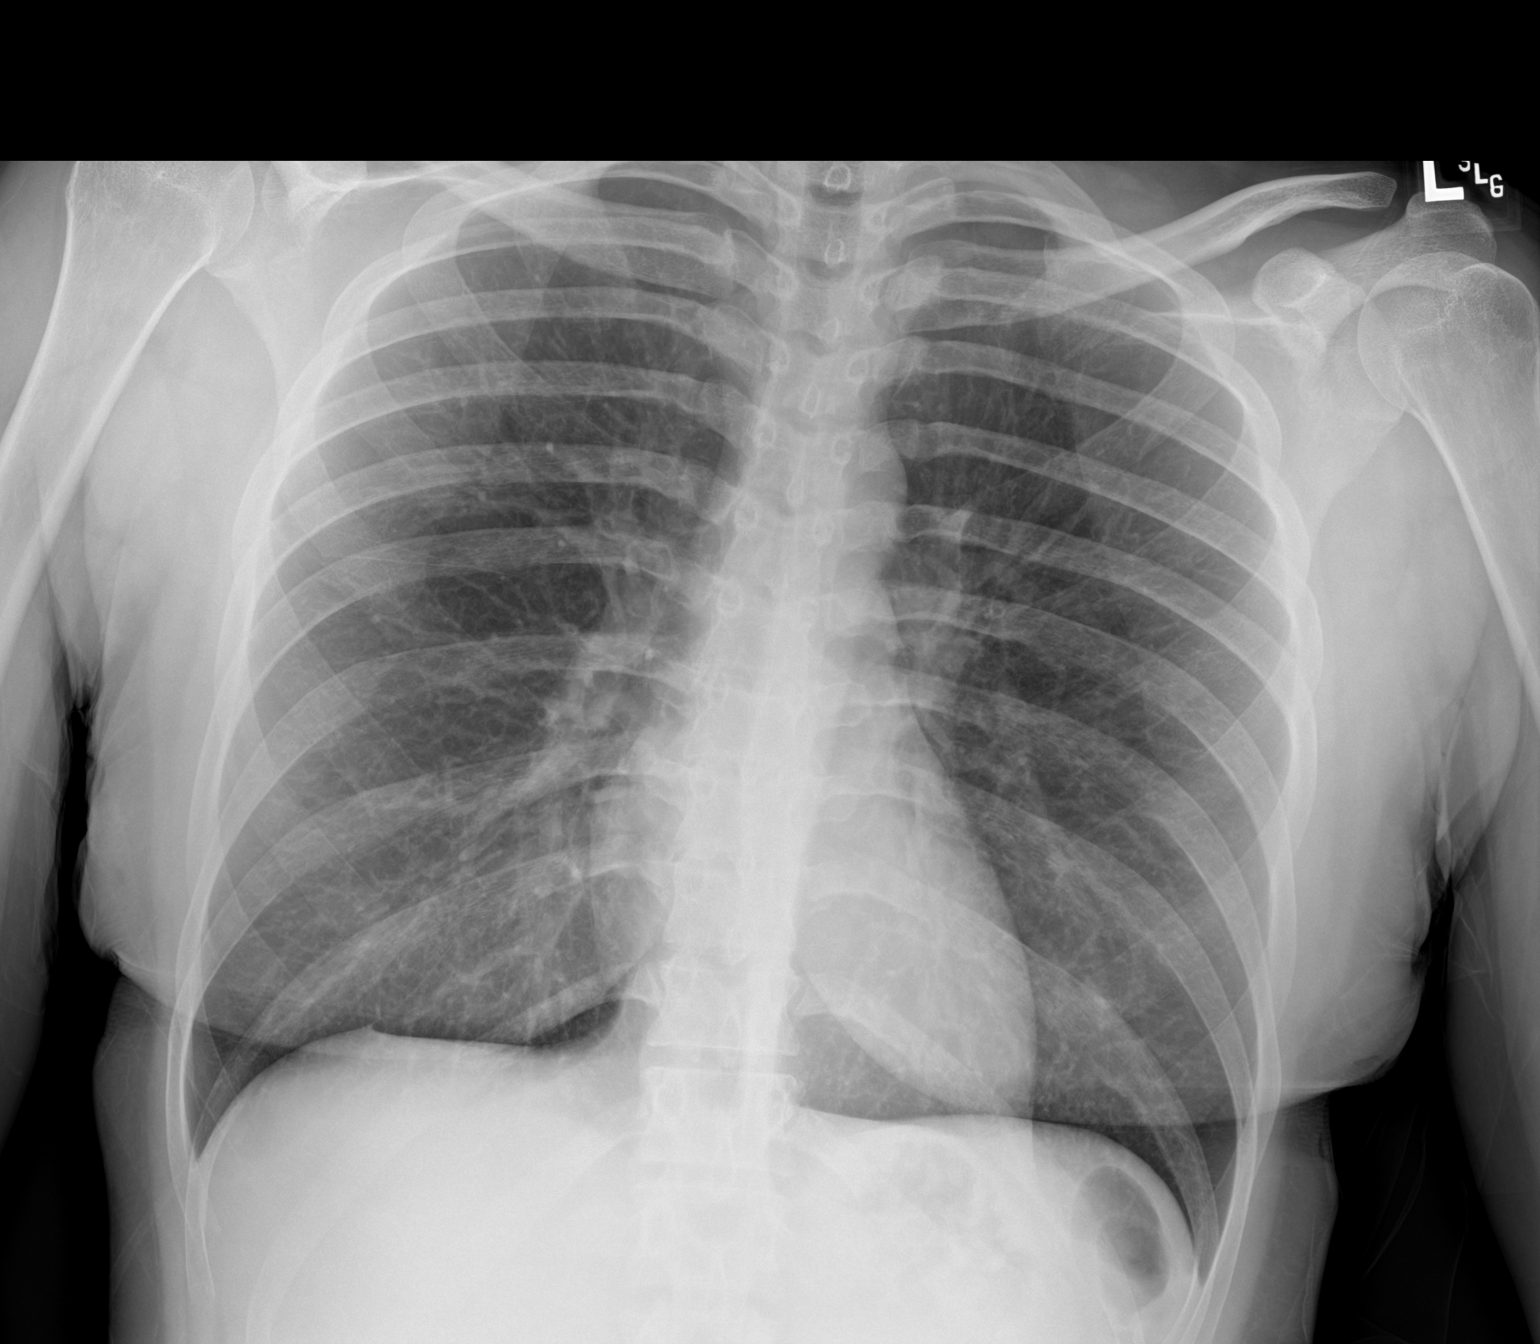

[1 of 1 positions shown; findings below may reference images not displayed]

FINDINGS: The heart size and mediastinal contours are within normal limits.
Both lungs are clear. The visualized skeletal structures are
unremarkable.
IMPRESSION: No active disease.

## 2020-07-16 ENCOUNTER — Inpatient Hospital Stay
Admit: 2020-07-16 | Discharge: 2020-07-16 | Disposition: A | Discharge status: Home / Self Care | Payer: MEDICAID | Attending: Emergency Medicine

## 2020-07-16 DIAGNOSIS — U071 COVID-19: Secondary | ICD-10-CM

## 2020-07-16 LAB — CBC WITH AUTOMATED DIFF
ABS. BASOPHILS: 0 10*3/uL (ref 0.0–0.1)
ABS. EOSINOPHILS: 0 10*3/uL (ref 0.0–0.4)
ABS. IMM. GRANS.: 0 10*3/uL (ref 0.00–0.04)
ABS. LYMPHOCYTES: 0.3 10*3/uL — ABNORMAL LOW (ref 0.9–3.6)
ABS. MONOCYTES: 0.3 10*3/uL (ref 0.05–1.2)
ABS. NEUTROPHILS: 7.4 10*3/uL (ref 1.8–8.0)
ABSOLUTE NRBC: 0 10*3/uL (ref 0.00–0.01)
BASOPHILS: 0 % (ref 0–2)
EOSINOPHILS: 0 % (ref 0–5)
HCT: 33.4 % — ABNORMAL LOW (ref 35.0–45.0)
HGB: 9.6 g/dL — ABNORMAL LOW (ref 12.0–16.0)
IMMATURE GRANULOCYTES: 0 % (ref 0.0–0.5)
LYMPHOCYTES: 4 % — ABNORMAL LOW (ref 21–52)
MCH: 20.5 PG — ABNORMAL LOW (ref 24.0–34.0)
MCHC: 28.7 g/dL — ABNORMAL LOW (ref 31.0–37.0)
MCV: 71.2 FL — ABNORMAL LOW (ref 78.0–100.0)
MONOCYTES: 4 % (ref 3–10)
MPV: 9.5 FL (ref 9.2–11.8)
NEUTROPHILS: 91 % — ABNORMAL HIGH (ref 40–73)
NRBC: 0 PER 100 WBC
PLATELET: 457 10*3/uL — ABNORMAL HIGH (ref 135–420)
RBC: 4.69 M/uL (ref 4.20–5.30)
RDW: 19.4 % — ABNORMAL HIGH (ref 11.6–14.5)
WBC: 8.1 10*3/uL (ref 4.6–13.2)

## 2020-07-16 LAB — C REACTIVE PROTEIN, QT: C-Reactive protein: <0.3 mg/dL (ref 0–0.3)

## 2020-07-16 LAB — METABOLIC PANEL, COMPREHENSIVE
A-G Ratio: 1 (ref 0.8–1.7)
ALT (SGPT): 18 U/L (ref 13–56)
AST (SGOT): 14 U/L (ref 10–38)
Albumin: 3.8 g/dL (ref 3.4–5.0)
Alk. phosphatase: 59 U/L (ref 45–117)
Anion gap: 7 mmol/L (ref 3.0–18)
BUN/Creatinine ratio: 14 (ref 12–20)
BUN: 13 MG/DL (ref 7.0–18)
Bilirubin, total: 0.2 MG/DL (ref 0.2–1.0)
CO2: 24 mmol/L (ref 21–32)
Calcium: 8.9 MG/DL (ref 8.5–10.1)
Chloride: 107 mmol/L (ref 100–111)
Creatinine: 0.9 MG/DL (ref 0.6–1.3)
GFR est AA: >60 mL/min/{1.73_m2} (ref >60)
GFR est non-AA: >60 mL/min/{1.73_m2} (ref >60)
Globulin: 3.9 g/dL (ref 2.0–4.0)
Glucose: 127 mg/dL — ABNORMAL HIGH (ref 74–99)
Potassium: 4.1 mmol/L (ref 3.5–5.5)
Protein, total: 7.7 g/dL (ref 6.4–8.2)
Sodium: 138 mmol/L (ref 136–145)

## 2020-07-16 LAB — SARS-COV-2

## 2020-07-16 LAB — CK
CK: 68 U/L (ref 26–192)
Total CK: 68 U/L (ref 26–192)

## 2020-07-16 LAB — SED RATE (ESR): Sed rate, automated: 21 mm/hr — ABNORMAL HIGH (ref 0–20)

## 2020-07-16 LAB — CBC WITH AUTO DIFFERENTIAL
Basophils %: 0 % (ref 0–2)
Basophils Absolute: 0 10*3/uL (ref 0.0–0.1)
Eosinophils %: 0 % (ref 0–5)
Eosinophils Absolute: 0 10*3/uL (ref 0.0–0.4)
Granulocyte Absolute Count: 0 10*3/uL (ref 0.00–0.04)
Hematocrit: 33.4 % — ABNORMAL LOW (ref 35.0–45.0)
Hemoglobin: 9.6 g/dL — ABNORMAL LOW (ref 12.0–16.0)
Immature Granulocytes: 0 % (ref 0.0–0.5)
Lymphocytes %: 4 % — ABNORMAL LOW (ref 21–52)
Lymphocytes Absolute: 0.3 10*3/uL — ABNORMAL LOW (ref 0.9–3.6)
MCH: 20.5 PG — ABNORMAL LOW (ref 24.0–34.0)
MCHC: 28.7 g/dL — ABNORMAL LOW (ref 31.0–37.0)
MCV: 71.2 FL — ABNORMAL LOW (ref 78.0–100.0)
MPV: 9.5 FL (ref 9.2–11.8)
Monocytes %: 4 % (ref 3–10)
Monocytes Absolute: 0.3 10*3/uL (ref 0.05–1.2)
NRBC Absolute: 0 10*3/uL (ref 0.00–0.01)
Neutrophils %: 91 % — ABNORMAL HIGH (ref 40–73)
Neutrophils Absolute: 7.4 10*3/uL (ref 1.8–8.0)
Nucleated RBCs: 0 PER 100 WBC
Platelets: 457 10*3/uL — ABNORMAL HIGH (ref 135–420)
RBC: 4.69 M/uL (ref 4.20–5.30)
RDW: 19.4 % — ABNORMAL HIGH (ref 11.6–14.5)
WBC: 8.1 10*3/uL (ref 4.6–13.2)

## 2020-07-16 LAB — COMPREHENSIVE METABOLIC PANEL
ALT: 18 U/L (ref 13–56)
AST: 14 U/L (ref 10–38)
Albumin/Globulin Ratio: 1 (ref 0.8–1.7)
Albumin: 3.8 g/dL (ref 3.4–5.0)
Alkaline Phosphatase: 59 U/L (ref 45–117)
Anion Gap: 7 mmol/L (ref 3.0–18)
BUN: 13 MG/DL (ref 7.0–18)
Bun/Cre Ratio: 14 (ref 12–20)
CO2: 24 mmol/L (ref 21–32)
Calcium: 8.9 MG/DL (ref 8.5–10.1)
Chloride: 107 mmol/L (ref 100–111)
Creatinine: 0.9 MG/DL (ref 0.6–1.3)
EGFR IF NonAfrican American: >60 mL/min/{1.73_m2} (ref >60)
GFR African American: >60 mL/min/{1.73_m2} (ref >60)
Globulin: 3.9 g/dL (ref 2.0–4.0)
Glucose: 127 mg/dL — ABNORMAL HIGH (ref 74–99)
Potassium: 4.1 mmol/L (ref 3.5–5.5)
Sodium: 138 mmol/L (ref 136–145)
Total Bilirubin: 0.2 MG/DL (ref 0.2–1.0)
Total Protein: 7.7 g/dL (ref 6.4–8.2)

## 2020-07-16 LAB — C-REACTIVE PROTEIN: CRP: <0.3 mg/dL (ref 0–0.3)

## 2020-07-16 LAB — SEDIMENTATION RATE: Sed Rate: 21 mm/hr — ABNORMAL HIGH (ref 0–20)

## 2020-07-16 LAB — COVID-19

## 2020-07-16 MED ORDER — SODIUM CHLORIDE 0.9% BOLUS IV
0.9 % | Freq: Once | INTRAVENOUS | Status: AC
Start: 2020-07-16 — End: 2020-07-16
  Administered 2020-07-16: 09:00:00 via INTRAVENOUS

## 2020-07-16 MED ORDER — ACETAMINOPHEN 325 MG TABLET
325 mg | ORAL | Status: DC
Start: 2020-07-16 — End: 2020-07-16

## 2020-07-16 MED FILL — SODIUM CHLORIDE 0.9 % IV: INTRAVENOUS | Qty: 1000

## 2020-07-16 MED FILL — ACETAMINOPHEN 325 MG TABLET: 325 mg | ORAL | Qty: 2

## 2020-07-16 NOTE — ED Notes (Signed)
Pt given verbal and written d/c instructions. pt ambulated from ED in NAD

## 2020-07-16 NOTE — ED Notes (Signed)
Pt c/o joint pain/burning sensation x1 week. States she believes she is having lupus flare up.

## 2020-07-16 NOTE — ED Provider Notes (Signed)
EMERGENCY DEPARTMENT HISTORY AND PHYSICAL EXAM    Date: 07/16/2020  Patient Name: Denise Gomez    History of Presenting Illness     Chief Complaint   Patient presents with   ??? Joint Pain         History Provided By: Patient    Denise Gomez is a 29 year old female with past medical history of lupus presents for evaluation of generalized body aches, intermittent fevers, joint pain.  Patient states that she has had several days of feeling generally unwell with body aches.  She denies any exacerbating or relieving factors.  Patient lives in [facility] and is followed by her primary care provider.  She has tried taking prednisone without any significant provement.  Patient denies any known Covid exposures, she is traveling with her family for the holiday.    PCP: None    Current Facility-Administered Medications   Medication Dose Route Frequency Provider Last Rate Last Admin   ??? acetaminophen (TYLENOL) tablet 650 mg  650 mg Oral NOW Conni Slipper, MD           Past History     Past Medical History:  Past Medical History:   Diagnosis Date   ??? Depression    ??? Lupus ([place])    ??? Pericarditis, chronic    ??? PTSD (post-traumatic stress disorder)        Past Surgical History:  History reviewed. No pertinent surgical history.    Family History:  History reviewed. No pertinent family history.    Social History:  Social History     Tobacco Use   ??? Smoking status: Never Smoker   ??? Smokeless tobacco: Never Used   Substance Use Topics   ??? Alcohol use: Not Currently   ??? Drug use: Never       Allergies:  No Known Allergies      Review of Systems   Review of Systems   Constitutional: Positive for chills and fever. Negative for activity change.   HENT: Negative for congestion and sore throat.    Eyes: Negative for discharge.   Respiratory: Negative for apnea.    Cardiovascular: Negative for chest pain.   Gastrointestinal: Negative for abdominal distention.   Genitourinary: Negative for dysuria and flank pain.   Musculoskeletal:  Positive for joint swelling and myalgias. Negative for arthralgias.   Skin: Negative for rash.   Neurological: Negative for dizziness and weakness.   Hematological: Negative for adenopathy.   Psychiatric/Behavioral: Negative for agitation.   All other systems reviewed and are negative.      Physical Exam     Vitals:    07/16/20 0107   BP: (!) 152/83   Pulse: 100   Resp: 18   Temp: 98.8 ??F (37.1 ??C)   SpO2: 100%   Weight: 66.2 kg (146 lb)   Height: '5\' 6"'  (1.676 m)     Physical Exam    Nursing notes and vital signs reviewed    Constitutional: Non toxic appearing, moderate distress  Head: Normocephalic, Atraumatic  Eyes: EOMI  Neck: Supple  Cardiovascular: Regular rate and rhythm, no murmurs, rubs, or gallops  Chest: Normal work of breathing and chest excursion bilaterally  Lungs: Clear to ausculation bilaterally  Abdomen: Soft, non tender, non distended, normoactive bowel sounds  Back: No evidence of trauma or deformity  Extremities: No evidence of trauma or deformity, no LE edema  Skin: Warm and dry, normal cap refill  Neuro: Alert and appropriate, CN intact, normal speech,  strength and sensation full and symmetric bilaterally, normal gait, normal coordination  Psychiatric: Normal mood and affect      Diagnostic Study Results     Labs -     Recent Results (from the past 12 hour(s))   CBC WITH AUTOMATED DIFF    Collection Time: 07/16/20  3:44 AM   Result Value Ref Range    WBC 8.1 4.6 - 13.2 K/uL    RBC 4.69 4.20 - 5.30 M/uL    HGB 9.6 (L) 12.0 - 16.0 g/dL    HCT 33.4 (L) 35.0 - 45.0 %    MCV 71.2 (L) 78.0 - 100.0 FL    MCH 20.5 (L) 24.0 - 34.0 PG    MCHC 28.7 (L) 31.0 - 37.0 g/dL    RDW 19.4 (H) 11.6 - 14.5 %    PLATELET 457 (H) 135 - 420 K/uL    MPV 9.5 9.2 - 11.8 FL    NRBC 0.0 0 PER 100 WBC    ABSOLUTE NRBC 0.00 0.00 - 0.01 K/uL    NEUTROPHILS 91 (H) 40 - 73 %    LYMPHOCYTES 4 (L) 21 - 52 %    MONOCYTES 4 3 - 10 %    EOSINOPHILS 0 0 - 5 %    BASOPHILS 0 0 - 2 %    IMMATURE GRANULOCYTES 0 0.0 - 0.5 %    ABS.  NEUTROPHILS 7.4 1.8 - 8.0 K/UL    ABS. LYMPHOCYTES 0.3 (L) 0.9 - 3.6 K/UL    ABS. MONOCYTES 0.3 0.05 - 1.2 K/UL    ABS. EOSINOPHILS 0.0 0.0 - 0.4 K/UL    ABS. BASOPHILS 0.0 0.0 - 0.1 K/UL    ABS. IMM. GRANS. 0.0 0.00 - 0.04 K/UL    DF AUTOMATED     METABOLIC PANEL, COMPREHENSIVE    Collection Time: 07/16/20  3:44 AM   Result Value Ref Range    Sodium 138 136 - 145 mmol/L    Potassium 4.1 3.5 - 5.5 mmol/L    Chloride 107 100 - 111 mmol/L    CO2 24 21 - 32 mmol/L    Anion gap 7 3.0 - 18 mmol/L    Glucose 127 (H) 74 - 99 mg/dL    BUN 13 7.0 - 18 MG/DL    Creatinine 0.90 0.6 - 1.3 MG/DL    BUN/Creatinine ratio 14 12 - 20      GFR est AA >60 >60 ml/min/1.38m    GFR est non-AA >60 >60 ml/min/1.769m   Calcium 8.9 8.5 - 10.1 MG/DL    Bilirubin, total 0.2 0.2 - 1.0 MG/DL    ALT (SGPT) 18 13 - 56 U/L    AST (SGOT) 14 10 - 38 U/L    Alk. phosphatase 59 45 - 117 U/L    Protein, total 7.7 6.4 - 8.2 g/dL    Albumin 3.8 3.4 - 5.0 g/dL    Globulin 3.9 2.0 - 4.0 g/dL    A-G Ratio 1.0 0.8 - 1.7     SED RATE (ESR)    Collection Time: 07/16/20  3:44 AM   Result Value Ref Range    Sed rate, automated 21 (H) 0 - 20 mm/hr   CK    Collection Time: 07/16/20  3:44 AM   Result Value Ref Range    CK 68 26 - 192 U/L   SARS-COV-2    Collection Time: 07/16/20  3:44 AM   Result Value Ref Range    SARS-CoV-2 Please find  results under separate order         Radiologic Studies -   No orders to display     CT Results  (Last 48 hours)    None        CXR Results  (Last 48 hours)    None          Medications given in the ED-  Medications   acetaminophen (TYLENOL) tablet 650 mg (650 mg Oral Refused 07/16/20 0335)   sodium chloride 0.9 % bolus infusion 1,000 mL (1,000 mL IntraVENous New Bag 07/16/20 0349)         Medical Decision Making   I am the first provider for this patient.    I reviewed the vital signs, available nursing notes, past medical history, past surgical history, family history and social history.    Vital Signs-Reviewed the patient's  vital signs.    Pulse Oximetry Analysis - 100% on room air, not hypoxic     Records Reviewed: Old Medical Records    Provider Notes (Medical Decision Making): Denise Gomez is a 29 year old female presents for evaluation of generalized body aches, fever, chills in the setting of known history of lupus.  Patient is presenting with her family with concern for possible COVID-19.  On arrival patient is afebrile, nontoxic-appearing and hemodynamically stable.  CBC, CMP, CPK, ESR obtained.  ESR is minimally elevated to 21.  Following IV fluids, Tylenol, patient feels significantly improved.  Renal function is found to be normal.  Patient will be discharged with follow-up with her primary care provider.  All questions were answered, patient discharged in stable condition.    Procedures:  Procedures    ED Course:       Diagnosis and Disposition       DISCHARGE NOTE:    Denise Gomez's  results have been reviewed with her.  She has been counseled regarding her diagnosis, treatment, and plan.  She verbally conveys understanding and agreement of the signs, symptoms, diagnosis, treatment and prognosis and additionally agrees to follow up as discussed.  She also agrees with the care-plan and conveys that all of her questions have been answered.  I have also provided discharge instructions for her that include: educational information regarding their diagnosis and treatment, and list of reasons why they would want to return to the ED prior to their follow-up appointment, should her condition change. She has been provided with education for proper emergency department utilization.     CLINICAL IMPRESSION:    1. Myalgia    2. Person under investigation for COVID-19        PLAN:  1. D/C Home  2. There are no discharge medications for this patient.    3.   Follow-up Information     Follow up With Specialties Details [facility]    [facility] EMERGENCY DEPT Emergency Medicine  As needed, If symptoms worsen [address]  [place]  [place] [address]  [phone]        _______________________________      Please note that this dictation was completed with Dragon, the computer voice recognition software.  Quite often unanticipated grammatical, syntax, homophones, and other interpretive errors are inadvertently transcribed by the computer software.  Please disregard these errors.  Please excuse any errors that have escaped final proofreading.

## 2020-07-17 LAB — SARS-COV-2 BY NAA: SARS-CoV-2, NAA: DETECTED — CR

## 2020-07-17 NOTE — Progress Notes (Signed)
Patient contacted regarding COVID-19 under investigation for COVID-19. Discussed COVID-19 related testing which was available at this time. Test results were positive. Patient informed of results, if available? yes.     Ambulatory Care Manager contacted the patient by telephone to perform post discharge assessment. Call within 2 business days of discharge: Yes Verified name and DOB with patient as identifiers. Provided introduction to self, and explanation of the CTN/ACM role, and reason for call due to risk factors for infection and/or exposure to COVID-19.     Symptoms reviewed with patient who verbalized the following symptoms: pain or aching joints, states she feels 'much better' since receiving fluids in ED      Due to no new or worsening symptoms encounter was not routed to provider for escalation. Discussed follow-up appointments. If no appointment was previously scheduled, appointment scheduling offered:  no.  [facility] follow up appointment(s): No future appointments.  Non-[facility] follow up appointment(s): patient will contact PCP when she returns home     Interventions to address risk factors: Obtained and reviewed discharge summary and/or continuity of care documents and Reviewed and followed up on pending diagnostic tests and treatments-COVID-19 test results     Advance Care Planning:   Does patient have an Advance Directive: not on file.     Educated patient about risk for severe COVID-19 due to risk factors according to CDC guidelines. ACM reviewed discharge instructions, medical action plan and red flag symptoms with the patient who verbalized understanding. Discussed COVID vaccination status: yes. Education provided on COVID-19 vaccination as appropriate. Discussed exposure protocols and quarantine with CDC Guidelines. Patient was given an opportunity to verbalize any questions and concerns and agrees to contact ACM or health care provider for questions related to their healthcare.    Reviewed and educated  patient on any new and changed medications related to discharge diagnosis     Was patient discharged with a pulse oximeter? no Discussed and confirmed pulse oximeter discharge instructions and when to notify provider or seek emergency care.    ACM provided contact information. Plan for follow up call in 7-14 days based on severity of symptoms and risk factors.    Patient has not been vaccinated, has not decided whether she will get vaccinated. ACM instructed patient on COVID-19 protocol/precautions, verbalizes understanding.

## 2020-08-01 NOTE — Progress Notes (Signed)
Patient resolved from COVID Care Transitions episode on 08/01/20.  Discussed COVID-19 related testing which was available at this time. Test results were positive. Patient informed of results, if available? Patient informed of test results on 07/17/20     Patient/family has been provided the following resources and education related to COVID-19:                         Signs, symptoms and red flags related to COVID-19            CDC exposure and quarantine guidelines            [facility] exposure contact - [phone]            Contact for their local Department of Health                 Patient currently reports that the following symptoms have improved:  no symptoms.    No further outreach scheduled with this CTN/ACM/LPN/HC/ MA.  Episode of Care resolved.  Patient has this CTN/ACM/LPN/HC/MA contact information if future needs arise.
